# Patient Record
Sex: Female | Born: 1955 | Race: Black or African American | Hispanic: No | Marital: Married | Smoking: Never smoker
Health system: Southern US, Community
[De-identification: ages and names within clinical notes are randomized; demographics above are authoritative.]

## PROBLEM LIST (undated history)

## (undated) DIAGNOSIS — N280 Ischemia and infarction of kidney: Secondary | ICD-10-CM

## (undated) DIAGNOSIS — I513 Intracardiac thrombosis, not elsewhere classified: Secondary | ICD-10-CM

## (undated) DIAGNOSIS — I4891 Unspecified atrial fibrillation: Secondary | ICD-10-CM

## (undated) DIAGNOSIS — I050 Rheumatic mitral stenosis: Secondary | ICD-10-CM

## (undated) DIAGNOSIS — E7800 Pure hypercholesterolemia, unspecified: Secondary | ICD-10-CM

## (undated) DIAGNOSIS — R011 Cardiac murmur, unspecified: Secondary | ICD-10-CM

## (undated) DIAGNOSIS — E669 Obesity, unspecified: Secondary | ICD-10-CM

## (undated) DIAGNOSIS — J45909 Unspecified asthma, uncomplicated: Secondary | ICD-10-CM

## (undated) DIAGNOSIS — I452 Bifascicular block: Secondary | ICD-10-CM

## (undated) DIAGNOSIS — I5032 Chronic diastolic (congestive) heart failure: Secondary | ICD-10-CM

## (undated) DIAGNOSIS — I060 Rheumatic aortic stenosis: Secondary | ICD-10-CM

## (undated) HISTORY — PX: KNEE ARTHROSCOPY: SUR90

## (undated) HISTORY — PX: ABDOMINAL HYSTERECTOMY: SHX81

---

## 1999-01-09 ENCOUNTER — Emergency Department (HOSPITAL_COMMUNITY): Admission: EM | Admit: 1999-01-09 | Discharge: 1999-01-09 | Payer: Self-pay | Admitting: Emergency Medicine

## 1999-01-09 ENCOUNTER — Encounter: Payer: Self-pay | Admitting: Emergency Medicine

## 2002-03-14 ENCOUNTER — Encounter: Payer: Self-pay | Admitting: Family Medicine

## 2002-03-14 ENCOUNTER — Encounter: Admission: RE | Admit: 2002-03-14 | Discharge: 2002-03-14 | Payer: Self-pay | Admitting: Family Medicine

## 2003-01-31 ENCOUNTER — Encounter: Admission: RE | Admit: 2003-01-31 | Discharge: 2003-01-31 | Payer: Self-pay | Admitting: Orthopedic Surgery

## 2003-02-24 ENCOUNTER — Encounter: Admission: RE | Admit: 2003-02-24 | Discharge: 2003-02-24 | Payer: Self-pay | Admitting: Orthopedic Surgery

## 2003-03-15 ENCOUNTER — Ambulatory Visit (HOSPITAL_COMMUNITY): Admission: RE | Admit: 2003-03-15 | Discharge: 2003-03-15 | Payer: Self-pay | Admitting: Family Medicine

## 2003-08-24 ENCOUNTER — Ambulatory Visit (HOSPITAL_COMMUNITY): Admission: RE | Admit: 2003-08-24 | Discharge: 2003-08-24 | Payer: Self-pay | Admitting: Orthopedic Surgery

## 2004-05-12 ENCOUNTER — Encounter: Admission: RE | Admit: 2004-05-12 | Discharge: 2004-05-12 | Payer: Self-pay | Admitting: Family Medicine

## 2004-12-22 ENCOUNTER — Encounter: Admission: RE | Admit: 2004-12-22 | Discharge: 2004-12-22 | Payer: Self-pay | Admitting: Otolaryngology

## 2005-03-09 ENCOUNTER — Encounter (INDEPENDENT_AMBULATORY_CARE_PROVIDER_SITE_OTHER): Payer: Self-pay | Admitting: Specialist

## 2005-03-09 ENCOUNTER — Inpatient Hospital Stay (HOSPITAL_COMMUNITY): Admission: RE | Admit: 2005-03-09 | Discharge: 2005-03-11 | Payer: Self-pay | Admitting: Obstetrics and Gynecology

## 2007-10-17 ENCOUNTER — Emergency Department (HOSPITAL_BASED_OUTPATIENT_CLINIC_OR_DEPARTMENT_OTHER): Admission: EM | Admit: 2007-10-17 | Discharge: 2007-10-17 | Payer: Self-pay | Admitting: Emergency Medicine

## 2008-12-27 ENCOUNTER — Ambulatory Visit (HOSPITAL_COMMUNITY): Admission: RE | Admit: 2008-12-27 | Discharge: 2008-12-27 | Payer: Self-pay | Admitting: Family Medicine

## 2010-02-01 ENCOUNTER — Encounter: Payer: Self-pay | Admitting: Orthopedic Surgery

## 2010-02-02 ENCOUNTER — Encounter: Payer: Self-pay | Admitting: Family Medicine

## 2010-05-30 NOTE — Op Note (Signed)
NAME:  [name] [name] [name], [name] [name]                 ACCOUNT NO.:  [id number]   MEDICAL RECORD NO.:  [id number]          PATIENT TYPE:  INP   LOCATION:  9399                          FACILITY:  [facility]   PHYSICIAN:  Cynthia P. Romine, M.D.DATE OF BIRTH:  [birth year]   DATE OF PROCEDURE:  03/09/2005  DATE OF DISCHARGE:                                 OPERATIVE REPORT   PREOPERATIVE DIAGNOSES:  1.  Menorrhagia.  2.  Uterine fibroids.  3.  Morbid obesity.   POSTOPERATIVE DIAGNOSES:  1.  Menorrhagia.  2.  Uterine fibroids.  3.  Morbid obesity.   OPERATION/PROCEDURE:  Total abdominal hysterectomy.   SURGEON:  Cynthia P. Romine, M.D.   ASSISTANT:  Lum Keas, M.D.   ANESTHESIA:  General endotracheal anesthesia.   ESTIMATED BLOOD LOSS:  400 mL.   COMPLICATIONS:  None.   DESCRIPTION OF PROCEDURE:  The patient was taken to the operating room and  after the induction of adequate general anesthesia, approximately 35 minutes  was spent prepping the patient's abdomen for surgery.  A Montgomery strap  was placed on the upper abdomen as the benzoin was applied.  Kerlix was  laced through the Montgomery strap and then the Montgomery strap was then  tied up to the bar over her face for anesthesia and this was to elevate her  pannus so that an appropriate incision could be made without the pannus  folding.  The pannus was rather large and there was a large fold right over  her symphysis that had to be dealt with and this was done with the  Montgomery strap.  She was then prepped with Betadine abdominally and  vaginally and catheter was placed.  She was draped with towels and then the  vertical incision was draped.   A vertical midline incision was made just from below the umbilicus to the  symphysis.  The subcutaneous tissue was taken down with the Bovie.  The  fascia was identified and opened with the Bovie in the midline and then  incised with scissors vertically.  The underlying rectus muscle was  separated sharply and bluntly in the midline.  The underlying peritoneum was  entered atraumatically and opened vertically with the Metzenbaum scissors.  A Balfour retractor was placed and a regular Balfour retractor was too short  to achieve adequate retraction.  Therefore, that one was removed and a long  Balfour retractor was placed which was accepted.  Very careful attention was  made to make sure that the blade was not pinching the pelvic sidewall.  The  bladder blade was not deep enough on the Balfour retractor so a Surveyor, mining was used and hand held by the scrub nurse during the surgery.  The  upper abdomen was inspected.  There were some adhesions between the liver  and the anterior abdominal wall.  The gallbladder contained no stones.  There was no pelvic or periaortic adenopathy.  In the pelvis the uterus was  enlarged to approximately 14-week size.  There were multiple uterine  fibroids. There were adhesions from the uterus to the bowels that  were  carefully taken down.  The uterus was then elevated out of the pelvis.  Kellys were placed in the cornua.  The round ligaments were stitched and  divided with Bovie.  The anterior leaf of the broad ligament was taken down  sharply on each side.  A window was created in the mesosalpinx and the  pedicle contending the right utero-ovarian and the tube were identified,  clamped and doubly tied on each side.  Uterine arteries were skeletonized.  The posterior peritoneum was taken down.  The bladder was taken down  sharply.  The uterine arteries were clamped on each side, cut and doubly  tied.  Hysterectomy proceeded down to cardinal ligaments, clamping, cutting  and tying in sequence.  After the first pedicle was taken beneath the  uterine arteries, the fundus was removed.  The cervix was grasped with  Kochers and the rest of the hysterectomy continued down the cardinal  ligaments, clamping, cutting and tying in sequence and then  clamping,  cutting and tying the uterosacral ligaments with a separate pedicle.  The  vagina was entered on the right and the specimen was removed with Mayo  scissors.  The vaginal cuff was grasped with Kochers.  Angle sutures were  placed at the right and left angles.  The vaginal cuff was then closed with  interrupted figure-of-eight sutures of 0 Vicryl.  The cuff was inspected and  felt to be hemostatic.  The ovaries were tied up to the round ligament on  each side.  Uterosacral ligaments were then tied together in the midline and  when that was done, there was noted to be a significant bleeder from between  the cuff and the bladder.  The cuff was difficult to assess now that the  uterosacral ligaments had been tied together.  Therefore, it was removed so  that the cuff was more easily spread out and the anatomy could be visualized  well.  A suture was placed at the site of the bleeder and all of the  pedicles were inspected and then felt to be hemostatic.  There was some  oozing from the base of the bladder and a piece of Gelfoam was placed there  and achieved hemostasis.  Ureters were identified bilaterally. The right one  seen to peristalsis.  The left one could not be seen to peristalsis and  could be palpated and is palpably normal.  The packs were removed.  The  bowel was allowed to return to its anatomic position.  The fascia was closed  with interrupted figure-of-eight sutures starting at the bottom and going to  the midline and then going from the top to the midline where the fascia was  closed and ON-Q catheter was placed in the subfascial space.  The  subcutaneous space was irrigated and was felt to be hemostatic.  A Al Pimple drain was placed in the subcutaneous space.  The skin was then closed  with staples. Dressing was applied.  The __________  was attached to the  Jackson-Pratt drain.  The ON-Q catheter was injected with 10 mL of 1% Xylocaine and this was hooked up  to the ON-Q pump to deliver Marcaine.  Dressing was applied and the procedure was terminated.  Sponge and  instrument counts were correct x3.  The patient was taken to the recovery  room in satisfactory condition.  The procedure overall was especially  difficult because of the morbid obesity of the patient.  She is 4 feet 11  inches tall and 252 pounds.           ______________________________  Edwena Felty. Romine, M.D.     CPR/MEDQ  D:  03/09/2005  T:  03/09/2005  Job:  [id number]   cc:   Elizabeth Palau, N.P.  [facility]

## 2010-05-30 NOTE — Discharge Summary (Signed)
NAME:  [name] [name], [name] [name]                 ACCOUNT NO.:  [id number]   MEDICAL RECORD NO.:  [id number]          PATIENT TYPE:  INP   LOCATION:  9310                          FACILITY:  [facility]   PHYSICIAN:  Cynthia P. Romine, M.D.DATE OF BIRTH:  October 07, 1955   DATE OF ADMISSION:  03/09/2005  DATE OF DISCHARGE:  03/11/2005                                 DISCHARGE SUMMARY   DISCHARGE DIAGNOSES:  1.  Leiomyomata.  2.  Menorrhagia.  3.  Morbid obesity.   HISTORY:  This is a 55 year old divorced black female, gravida 3, para 3,  complaining of menorrhagia to the point of anemia.  An ultrasound showed her  uterus to contain multiple fibroids 3-4 cm each.  The adnexa were normal.  The patient declined medical management and wanted definitive therapy with  hysterectomy.   On March 09, 2005, she was admitted to [facility] and underwent a  total abdominal hysterectomy.  Estimated blood loss was 400 cc.  There were  no complications.   Postoperatively, she did very well.  She was in satisfactory condition for  discharge on postoperative day #2.  She did have a postoperative On-Q pump  that was taken out before discharge.  She had staples in her vertical  abdominal incision, and she was instructed to come to the office to have  those removed in approximately five days.  She was given a prescription for  Percocet 5 mg #20 1 p.o. q.4h. p.r.n. pain.  Pathology report did confirm  leiomyomata.  The uterus measured 10 x 11 x 11 cm, and the cervix was 2 cm  in length and weighed 523 gm.   Labs showed admission H&H was 11 and 33.7.  On discharge, 9 and 27.2.           ______________________________  Edwena Felty. Romine, M.D.     CPR/MEDQ  D:  03/30/2005  T:  03/30/2005  Job:  [id number]

## 2010-05-30 NOTE — Op Note (Signed)
NAME:  [name] [name], [name] [name]                           ACCOUNT NO.:  [id number]   MEDICAL RECORD NO.:  [id number]                   PATIENT TYPE:  AMB   LOCATION:  DAY                                  FACILITY:  [facility]   PHYSICIAN:  Madlyn Frankel. Charlann Boxer, M.D.               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   POSTOPERATIVE DIAGNOSES/FINDINGS:  1. Abundant hyertrophic synovitis with synovial banding in the medial     compartment.  2. 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  3. Grade 2-3 chondromalacia in the medial compartment.  4. Degenerative complex tear to the posterior horn mid body of the medial     meniscus.  5. Degenerative tearing and fraying to the central portion of the lateral     meniscus and no discreet radial or cleavage type tear.  6. Grade 1 changes to the lateral compartment only.  7. Anterior cruciate ligament deficiency.   PROCEDURE:  1. Right knee diagnostic and operative arthroscopy with synovectomy,     anterior medial.  A medial and patellofemoral chondroplasty utilizing the     3.5 shaver and ring curettes in the femoral trochlea groove.  2. Subtotal medial meniscectomy.  3. Very minor lateral compartment debridement and notch debridement carried     out, no ACL reconstruction carried out.   SURGEON:  Madlyn Frankel. Charlann Boxer, M.D.   ASSISTANT:  None.   ANESTHESIA:  General.   ESTIMATED BLOOD LOSS:  Minimal.   COMPLICATIONS:  None apparent.   INDICATIONS FOR PROCEDURE:  [name] is a 55 year old female who had injured  her knee at work.  A MRI had been ordered by Dr. Trellis Paganini which revealed  deficient ACL  as well as medial and lateral meniscal tears. She had poor  physical exam findings that matched these findings. Based on her desire to  return to work and review of her exam findings matching the MRI findings, we  discussed surgical options.  At her age and activity level, it is not felt  that an ACL reconstruction would be warranted and for this reason, we would  offer to her medial and lateral meniscectomies and knee examination and  debridement as needed. After reviewing these risks and benefits including  the fact of not doing the ACL and postop rehab required. She consents for  the above procedure.   DESCRIPTION OF PROCEDURE:  The patient was brought to the operative theatre.  Once adequate anesthesia was established, the patient's right lower  extremity was placed in a leg holder, antibiotics  were given, standard  inferolateral, superolateral and inferomedial portals were utilized.  Diagnostic evaluation, the knee revealed the above noted findings. There  were multiple loose bodies that were noted and removed through suction  drainage. It was also evident the significant synovitis present. Examination  of the knee revealed the above noted findings. The inferior medial portal  was utilized as a working portal and utilized to debrided with baskets the  medial meniscus in subtotal form and 3.5 shaver used to debride this area.  A probe evaluation was done periodically to assess unstable flaps and  baskets utilized appropriately.  A notch was then identified and the empty  wall sign noted.  Debridement with 3.5 shaver was carried out to debride the  bulk of this area. The PCL was intact.  Next, the lateral compartment was  entered and noted to be with minor degenerative changes to the central  portion of the meniscus and cartilage.  The 3.5 shaver was used to debride  this. Attention was directed throughout the __________ carrying out a  synovectomy mainly on the medial side with a large  band as well as  anteriorly in order to help with visualization.  The patellofemoral  compartment and with 3.5 shaver was initially used to debride the patellar  surfaces which were minor but the trochlear surface required the use of a  ring curette in order to remove unstable flaps of cartilage to stable  levels. No other abrasion or microfracture technique was carried out. This  was not discussed nor was it planned for based on MRI findings or the  complaints as her primary complaints were mechanical in nature associated  with joint line tenderness. Following this procedure, the knee was copiously  irrigated through suction drainage with a 3.5 shaver, the knee was  reexamined for any loose bodies.  Visualization post debridement was  significantly improved. Instrumentation was removed.  The portals were  reapproximated with 3-0 nylon, the knee was injected with 0.25% Marcaine  with epinephrine. The knee was then dressed in sterile bulky Jones dressing.   The patient will be discharged home with postoperative medication and will  be weightbearing as tolerated and return to see me in 10-14 days.  I expect  her outcome from the knee arthroscopy to do fairy well. We will have her set  up with physical therapy for quad and hamstring strengthening once she has  recovered from __________.                                               Madlyn Frankel Charlann Boxer, M.D.    MDO/MEDQ  D:  08/24/2003  T:  08/25/2003  Job:  [id number]

## 2011-07-07 ENCOUNTER — Other Ambulatory Visit (HOSPITAL_COMMUNITY): Payer: Self-pay | Admitting: Family Medicine

## 2011-07-07 DIAGNOSIS — Z1231 Encounter for screening mammogram for malignant neoplasm of breast: Secondary | ICD-10-CM

## 2011-07-30 ENCOUNTER — Ambulatory Visit (HOSPITAL_COMMUNITY): Payer: Self-pay

## 2011-08-13 ENCOUNTER — Ambulatory Visit (HOSPITAL_COMMUNITY): Payer: 59 | Attending: Family Medicine

## 2011-10-06 ENCOUNTER — Ambulatory Visit (HOSPITAL_COMMUNITY)
Admission: RE | Admit: 2011-10-06 | Discharge: 2011-10-06 | Disposition: A | Discharge status: Home / Self Care | Payer: 59 | Source: Ambulatory Visit | Attending: Family Medicine | Admitting: Family Medicine

## 2011-10-06 DIAGNOSIS — Z1231 Encounter for screening mammogram for malignant neoplasm of breast: Secondary | ICD-10-CM | POA: Insufficient documentation

## 2011-10-14 ENCOUNTER — Emergency Department (HOSPITAL_BASED_OUTPATIENT_CLINIC_OR_DEPARTMENT_OTHER)
Admission: EM | Admit: 2011-10-14 | Discharge: 2011-10-14 | Disposition: A | Discharge status: Home / Self Care | Payer: 59 | Attending: Emergency Medicine | Admitting: Emergency Medicine

## 2011-10-14 ENCOUNTER — Encounter (HOSPITAL_BASED_OUTPATIENT_CLINIC_OR_DEPARTMENT_OTHER): Payer: Self-pay | Admitting: Emergency Medicine

## 2011-10-14 DIAGNOSIS — Z79899 Other long term (current) drug therapy: Secondary | ICD-10-CM | POA: Insufficient documentation

## 2011-10-14 DIAGNOSIS — Z885 Allergy status to narcotic agent status: Secondary | ICD-10-CM | POA: Insufficient documentation

## 2011-10-14 DIAGNOSIS — R040 Epistaxis: Secondary | ICD-10-CM

## 2011-10-14 HISTORY — DX: Unspecified asthma, uncomplicated: J45.909

## 2011-10-14 NOTE — ED Provider Notes (Signed)
History     CSN: [id number]  Arrival date & time 10/14/11  1104   First MD Initiated Contact with Patient 10/14/11 1141      Chief Complaint  Patient presents with  . Epistaxis    (Consider location/radiation/quality/duration/timing/severity/associated sxs/prior treatment) HPI Patient with short episode of bleeding from nose that began last night. She thinks it was from the left side. It resolves spontaneously. It has not returned. She has not any blood thinners and does not take aspirin. She denies any trauma to her nose. She has not weak or lightheaded. She has recently started Topamax and phentermine and voices concerns that these may contribute to any bleeding problems. Past Medical History  Diagnosis Date  . Asthma     Past Surgical History  Procedure Date  . Abdominal hysterectomy   . Knee arthroscopy     No family history on file.  History  Substance Use Topics  . Smoking status: Not on file  . Smokeless tobacco: Not on file  . Alcohol Use: No    OB History    Grav Para Term Preterm Abortions TAB SAB Ect Mult Living                  Review of Systems  Constitutional: Negative for fever, chills, activity change, appetite change and unexpected weight change.  HENT: Negative for sore throat, rhinorrhea, neck pain, neck stiffness and sinus pressure.   Eyes: Negative for visual disturbance.  Respiratory: Negative for cough and shortness of breath.   Cardiovascular: Negative for chest pain and leg swelling.  Gastrointestinal: Negative for vomiting, abdominal pain, diarrhea and blood in stool.  Genitourinary: Negative for dysuria, urgency, frequency, vaginal discharge and difficulty urinating.  Musculoskeletal: Negative for myalgias, arthralgias and gait problem.  Skin: Negative for color change and rash.  Neurological: Negative for weakness, light-headedness and headaches.  Hematological: Does not bruise/bleed easily.  Psychiatric/Behavioral: Negative for  dysphoric mood.    Allergies  Codeine  Home Medications   Current Outpatient Rx  Name Route Sig Dispense Refill  . PHENTERMINE HCL 37.5 MG PO CAPS Oral Take 37.5 mg by mouth every morning.    . TOPIRAMATE 100 MG PO TABS Oral Take 100 mg by mouth daily.      BP 140/70  Pulse 82  Temp 97.2 F (36.2 C) (Oral)  Resp 16  Ht 5' (1.524 m)  Wt 245 lb (111.131 kg)  BMI 47.85 kg/m2  SpO2 99%  Physical Exam  Nursing note and vitals reviewed. Constitutional: She is oriented to person, place, and time. She appears well-developed and well-nourished.       Morbidly obese  HENT:  Head: Normocephalic and atraumatic.  Nose: Mucosal edema present. No rhinorrhea, nose lacerations, nasal deformity, septal deviation or nasal septal hematoma. No epistaxis. Right sinus exhibits no frontal sinus tenderness. Left sinus exhibits no frontal sinus tenderness.  Eyes: Conjunctivae normal are normal. Pupils are equal, round, and reactive to light.  Neck: Normal range of motion. Neck supple.  Cardiovascular: Normal rate and regular rhythm.   Pulmonary/Chest: Effort normal.  Abdominal: Soft. Bowel sounds are normal.  Musculoskeletal: Normal range of motion.  Neurological: She is alert and oriented to person, place, and time.  Skin: Skin is warm and dry.  Psychiatric: She has a normal mood and affect.    ED Course  Procedures (including critical care time)  Labs Reviewed - No data to display No results found.   No diagnosis found.    MDM  Patient with resolution of bleeding.  Patient counseled regarding treatment and prevention.        Hilario Quarry, MD 10/14/11 1153

## 2011-10-14 NOTE — ED Notes (Signed)
States last night started having a nosebleed and has had it intermittently throughout the night and this am.  Coughed up a "scab."  Was seen at [facility] Friday and put on diet pills.  Only change.  Has added fluids to her diet.

## 2012-03-20 ENCOUNTER — Emergency Department (HOSPITAL_BASED_OUTPATIENT_CLINIC_OR_DEPARTMENT_OTHER)
Admission: EM | Admit: 2012-03-20 | Discharge: 2012-03-20 | Disposition: A | Discharge status: Home / Self Care | Payer: 59 | Attending: Emergency Medicine | Admitting: Emergency Medicine

## 2012-03-20 ENCOUNTER — Encounter (HOSPITAL_BASED_OUTPATIENT_CLINIC_OR_DEPARTMENT_OTHER): Payer: Self-pay

## 2012-03-20 DIAGNOSIS — J45909 Unspecified asthma, uncomplicated: Secondary | ICD-10-CM | POA: Insufficient documentation

## 2012-03-20 DIAGNOSIS — Y9289 Other specified places as the place of occurrence of the external cause: Secondary | ICD-10-CM | POA: Insufficient documentation

## 2012-03-20 DIAGNOSIS — S335XXA Sprain of ligaments of lumbar spine, initial encounter: Secondary | ICD-10-CM | POA: Insufficient documentation

## 2012-03-20 DIAGNOSIS — E669 Obesity, unspecified: Secondary | ICD-10-CM | POA: Insufficient documentation

## 2012-03-20 DIAGNOSIS — Z79899 Other long term (current) drug therapy: Secondary | ICD-10-CM | POA: Insufficient documentation

## 2012-03-20 DIAGNOSIS — Y93G9 Activity, other involving cooking and grilling: Secondary | ICD-10-CM | POA: Insufficient documentation

## 2012-03-20 DIAGNOSIS — N390 Urinary tract infection, site not specified: Secondary | ICD-10-CM | POA: Insufficient documentation

## 2012-03-20 DIAGNOSIS — X500XXA Overexertion from strenuous movement or load, initial encounter: Secondary | ICD-10-CM | POA: Insufficient documentation

## 2012-03-20 DIAGNOSIS — S39012A Strain of muscle, fascia and tendon of lower back, initial encounter: Secondary | ICD-10-CM

## 2012-03-20 HISTORY — DX: Obesity, unspecified: E66.9

## 2012-03-20 LAB — CBC WITH DIFFERENTIAL/PLATELET
Basophils Absolute: 0 10*3/uL (ref 0.0–0.1)
Basophils Relative: 0 % (ref 0–1)
Eosinophils Absolute: 0.1 10*3/uL (ref 0.0–0.7)
Eosinophils Relative: 2 % (ref 0–5)
HCT: 39.1 % (ref 36.0–46.0)
Hemoglobin: 12.7 g/dL (ref 12.0–15.0)
Lymphocytes Relative: 23 % (ref 12–46)
Lymphs Abs: 1.2 10*3/uL (ref 0.7–4.0)
MCH: 29.4 pg (ref 26.0–34.0)
MCHC: 32.5 g/dL (ref 30.0–36.0)
MCV: 90.5 fL (ref 78.0–100.0)
Monocytes Absolute: 0.5 10*3/uL (ref 0.1–1.0)
Monocytes Relative: 9 % (ref 3–12)
Neutro Abs: 3.3 10*3/uL (ref 1.7–7.7)
Neutrophils Relative %: 65 % (ref 43–77)
Platelets: 205 10*3/uL (ref 150–400)
RBC: 4.32 MIL/uL (ref 3.87–5.11)
RDW: 15.3 % (ref 11.5–15.5)
WBC: 5 10*3/uL (ref 4.0–10.5)

## 2012-03-20 LAB — BASIC METABOLIC PANEL
BUN: 16 mg/dL (ref 6–23)
CO2: 29 mEq/L (ref 19–32)
Calcium: 9.4 mg/dL (ref 8.4–10.5)
Chloride: 103 mEq/L (ref 96–112)
Creatinine, Ser: 0.7 mg/dL (ref 0.50–1.10)
GFR calc Af Amer: >90 mL/min (ref >90)
GFR calc non Af Amer: >90 mL/min (ref >90)
Glucose, Bld: 100 mg/dL — ABNORMAL HIGH (ref 70–99)
Potassium: 4.3 mEq/L (ref 3.5–5.1)
Sodium: 141 mEq/L (ref 135–145)

## 2012-03-20 LAB — URINALYSIS, ROUTINE W REFLEX MICROSCOPIC
Bilirubin Urine: NEGATIVE
Glucose, UA: NEGATIVE mg/dL
Hgb urine dipstick: NEGATIVE
Ketones, ur: NEGATIVE mg/dL
Nitrite: NEGATIVE
Protein, ur: NEGATIVE mg/dL
Specific Gravity, Urine: 1.021 (ref 1.005–1.030)
Urobilinogen, UA: 1 mg/dL (ref 0.0–1.0)
pH: 7 (ref 5.0–8.0)

## 2012-03-20 LAB — URINE MICROSCOPIC-ADD ON

## 2012-03-20 MED ORDER — IBUPROFEN 800 MG PO TABS: 800.0000 mg | ORAL_TABLET | Freq: Three times a day (TID) | ORAL | Status: DC | PRN

## 2012-03-20 MED ORDER — HYDROCODONE-ACETAMINOPHEN 5-325 MG PO TABS: 1.0000 | ORAL_TABLET | Freq: Four times a day (QID) | ORAL | Status: DC | PRN

## 2012-03-20 MED ORDER — CEPHALEXIN 500 MG PO CAPS: 500.0000 mg | ORAL_CAPSULE | Freq: Four times a day (QID) | ORAL | Status: DC

## 2012-03-20 MED ORDER — OXYCODONE-ACETAMINOPHEN 5-325 MG PO TABS
1.0000 | ORAL_TABLET | Freq: Once | ORAL | Status: AC
  Administered 2012-03-20: 1 via ORAL
  Filled 2012-03-20 (×2): qty 1

## 2012-03-20 MED ORDER — IBUPROFEN 800 MG PO TABS
800.0000 mg | ORAL_TABLET | Freq: Once | ORAL | Status: AC
  Administered 2012-03-20: 800 mg via ORAL
  Filled 2012-03-20: qty 1

## 2012-03-20 NOTE — ED Provider Notes (Signed)
Medical screening examination/treatment/procedure(s) were performed by non-physician practitioner and as supervising physician I was immediately available for consultation/collaboration.  Imani Fiebelkorn, MD 03/20/12 1539 

## 2012-03-20 NOTE — ED Provider Notes (Signed)
History     CSN: [id number]  Arrival date & time 03/20/12  1043   First MD Initiated Contact with Patient 03/20/12 1159      Chief Complaint  Patient presents with  . Back Pain    (Consider location/radiation/quality/duration/timing/severity/associated sxs/prior treatment) HPI Patient presents emergency department with left back pain.  Patient, states she was cooking when she turned and started feeling sudden onset of left mid back pain.  Patient, states today's issues, moving mattresses, and some bedroom furniture.  Patient denies any other injuries.  He should states she did not fall.  Patient denies numbness, weakness, incontinence, fever, dysuria, or weakness.  Patient, states, that she's quite a few times over the last few days and noticed blood after urinating.  Patient denies she took anything prior to route for her symptoms. Past Medical History  Diagnosis Date  . Asthma   . Obesity     Past Surgical History  Procedure Laterality Date  . Abdominal hysterectomy    . Knee arthroscopy      No family history on file.  History  Substance Use Topics  . Smoking status: Not on file  . Smokeless tobacco: Never Used  . Alcohol Use: No    OB History   Grav Para Term Preterm Abortions TAB SAB Ect Mult Living                  Review of Systems All other systems negative except as documented in the HPI. All pertinent positives and negatives as reviewed in the HPI. Allergies  Codeine  Home Medications   Current Outpatient Rx  Name  Route  Sig  Dispense  Refill  . albuterol (PROVENTIL HFA;VENTOLIN HFA) 108 (90 BASE) MCG/ACT inhaler   Inhalation   Inhale 2 puffs into the lungs every 6 (six) hours as needed for wheezing.         . budesonide (PULMICORT) 180 MCG/ACT inhaler   Inhalation   Inhale 1-2 puffs into the lungs 2 (two) times daily.         Marland Kitchen SIMVASTATIN PO   Oral   Take by mouth.         . phentermine 37.5 MG capsule   Oral   Take 37.5 mg by mouth  every morning.         . topiramate (TOPAMAX) 100 MG tablet   Oral   Take 100 mg by mouth daily.           BP 138/75  Pulse 81  Temp(Src) 98.1 F (36.7 C) (Oral)  Resp 20  Ht 5' (1.524 m)  Wt 245 lb (111.131 kg)  BMI 47.85 kg/m2  SpO2 100%  Physical Exam  Constitutional: She is oriented to person, place, and time. She appears well-developed and well-nourished.  HENT:  Head: Normocephalic and atraumatic.  Mouth/Throat: Oropharynx is clear and moist.  Eyes: Pupils are equal, round, and reactive to light.  Neck: Normal range of motion. Neck supple.  Cardiovascular: Normal rate, regular rhythm and normal heart sounds.  Exam reveals no gallop and no friction rub.   No murmur heard. Pulmonary/Chest: Effort normal and breath sounds normal. No respiratory distress. She has no wheezes. She has no rales. She exhibits no tenderness.  Abdominal: Soft. Bowel sounds are normal. She exhibits no distension. There is no tenderness. There is no guarding.  Musculoskeletal:       Lumbar back: She exhibits tenderness and pain. She exhibits normal range of motion, no bony tenderness, no deformity  and no spasm.       Back:  Neurological: She is alert and oriented to person, place, and time.  Skin: Skin is warm and dry. No rash noted.    ED Course  Procedures (including critical care time)  Labs Reviewed  BASIC METABOLIC PANEL - Abnormal; Notable for the following:    Glucose, Bld 100 (*)    All other components within normal limits  URINALYSIS, ROUTINE W REFLEX MICROSCOPIC - Abnormal; Notable for the following:    Leukocytes, UA SMALL (*)    All other components within normal limits  URINE MICROSCOPIC-ADD ON - Abnormal; Notable for the following:    Squamous Epithelial / LPF FEW (*)    Bacteria, UA MANY (*)    All other components within normal limits  URINE CULTURE  CBC WITH DIFFERENTIAL   The patient be treated for lumbar muscular strain.  She is also advised to use ice and heat  on her back.  She is also advised to follow up with her primary care Dr. for recheck.  Patient may have a slight urinary tract infection and will give treatment for that as well.   MDM         Carlyle Dolly, PA-C 03/20/12 1427

## 2012-03-20 NOTE — ED Notes (Signed)
Pt states that she onset of severe L sided back pain this morning, pt has not taken anything for her pain.  Pt denies hematuria, dysuria, n/v.

## 2012-03-22 LAB — URINE CULTURE: Colony Count: 80000

## 2012-07-04 ENCOUNTER — Encounter: Payer: 59 | Attending: Family Medicine | Admitting: Dietician

## 2012-07-04 ENCOUNTER — Encounter: Payer: Self-pay | Admitting: Dietician

## 2012-07-04 DIAGNOSIS — E669 Obesity, unspecified: Secondary | ICD-10-CM | POA: Insufficient documentation

## 2012-07-04 DIAGNOSIS — Z713 Dietary counseling and surveillance: Secondary | ICD-10-CM | POA: Insufficient documentation

## 2012-07-04 NOTE — Progress Notes (Signed)
Medical Nutrition Therapy:  Appt start time: 0900 end time:  1000.  Assessment:  Primary concerns today:  Obesity.   MEDICATIONS: see list.   DIETARY INTAKE:  Pt states she sometimes is forced to work a doubleshift due to her employment set up. In this case, she may not eat at normal breakfast time (about 8 am), and needs to work 12 pm. Her standard shift is 12am to 8 am. She states some difficulties with keeping a consistent schedule. Her sleep schedule is often interrupted.  Usual eating pattern includes 2-3 meals and 1-2 snacks per day.  Everyday foods include sweet drinks.  Avoided foods include pork, shrimp ([place]).    24-hr recall:  B ( AM): often gets fast food, particularly [employer]- chicken biscuit, and drink like sweet tea, OJ. She often eats only half of breading. Sometimes coffee with cream (1-2 TBSP). Sometimes needs to wait until 12 noon. Usually at 10-1028. If interrupted, she eats just a pack of nabs and/or fruit, popcorn. Snk ( AM): none L ( PM): might have PB sandwich, or leftovers, or eating out Snk ( PM): sometimes ice cream D ( PM): usually packs something for work- fish or [place] with greens, rice or sweet potato. Sometimes just a protein and starch. She skips dinner when she feels she ate lunch late or had a heavy lunch. Snk ( PM): popcorn, nabs, fruit, granola bars, sometimes chips Beverages: coffee, sweet tea, OJ or cranberry juice, few sodas (no diet), water, milk (2%) before bed  Pt claims to enjoy sweets, but limits intake.  Usual physical activity: pool, water aerobics after her work shift (about 9 am) tries for 3 days per week for about an hour each. On feet for work. She works in her garden and does some yard work.   Progress Towards Goal(s):  In progress.   Nutritional Diagnosis:  [place]-3.3 Overweight/obesity As related to intake of high kcal foods, particularly sweet drinks, meal skipping, low to moderate physical activity.  As evidenced by pt reports of  same, diet recall, BMI>40.    Intervention:  Nutrition counseling provided regarding reduction of total kcal intake by limiting high fat and high sugar foods, focusing on increasing high protein and high fiber food intake. RD also stressed the importance of gaining consistency in her meal pattern, and improving sleep habits to obtain 7 uninterrupted hours per day. RD discussed with patient an appropriate meal, physical activity and sleep schedule, and pt expressed good understanding. RD also answered questions about possible surgical weight loss options.  RD prescribed a meal pattern to patient as follows: B: 2 CHO, 3 Pro, 2 fat -- before work L: 3 CHO, 3 Pro, 2 fat, 1+ Veg -- at work D: 2 CHO, 3 Pro, 2 fat, 2+ Veg -- before bed S: 2 CHO, 2 Pro, 2 fat -- before workout or during extended shift  Handouts given during visit include:  High Protein, Fat, Fiber, Sugar Foods  CHO controlled diet plan  Monitoring/Evaluation:  Dietary intake, exercise, portion control, meal pattern, and body weight in 6 week(s).

## 2012-08-16 ENCOUNTER — Ambulatory Visit: Payer: 59 | Admitting: Dietician

## 2012-11-03 ENCOUNTER — Institutional Professional Consult (permissible substitution): Payer: 59 | Admitting: Internal Medicine

## 2012-11-03 ENCOUNTER — Encounter: Payer: Self-pay | Admitting: Internal Medicine

## 2012-11-03 ENCOUNTER — Ambulatory Visit (INDEPENDENT_AMBULATORY_CARE_PROVIDER_SITE_OTHER): Payer: 59 | Admitting: Internal Medicine

## 2012-11-03 VITALS — BP 130/86 | HR 77 | Temp 97.5°F | Ht 60.0 in | Wt 267.4 lb

## 2012-11-03 DIAGNOSIS — J45909 Unspecified asthma, uncomplicated: Secondary | ICD-10-CM

## 2012-11-03 DIAGNOSIS — R0609 Other forms of dyspnea: Secondary | ICD-10-CM

## 2012-11-03 DIAGNOSIS — R0600 Dyspnea, unspecified: Secondary | ICD-10-CM

## 2012-11-03 MED ORDER — FAMOTIDINE 20 MG PO TABS: ORAL_TABLET | ORAL | Status: DC

## 2012-11-03 MED ORDER — PANTOPRAZOLE SODIUM 40 MG PO TBEC: 40.0000 mg | DELAYED_RELEASE_TABLET | Freq: Every day | ORAL | Status: DC

## 2012-11-03 NOTE — Progress Notes (Signed)
  Subjective:    Patient ID: [name] [name], female    DOB: [birth year]   MRN: [id number]  HPI  27 yobf no problem as child with resp problems never smoker then onset of asthma at 71 came and went until 2013 but persistent symptoms of doe down the hall way since then gradually worse esp when hot referred by [name] [name] 11/03/2012 to pulmonary clinic  11/03/2012 1st [facility] office visit/ [name] [name] cc indolent onset pregressively worse doe  x hallway at work and while swimming at wt 240 prior to onset of these symptoms.  No real change activity tol  p albuterol or after starting pulmocort.   No obvious day to day or daytime variabilty or assoc chronic cough or cp or chest tightness, subjective wheeze overt sinus or hb symptoms. No unusual exp hx or h/o childhood pna/ asthma or knowledge of premature birth.  Sleeping ok without nocturnal  or early am exacerbation  of respiratory  c/o's or need for noct saba. Also denies any obvious fluctuation of symptoms with weather or environmental changes or other aggravating or alleviating factors except as outlined above   Current Medications, Allergies, Complete Past Medical History, Past Surgical History, Family History, and Social History were reviewed in [employer] record.           Review of Systems  Constitutional: Positive for unexpected weight change. Negative for fever.  HENT: Negative for congestion, dental problem, ear pain, nosebleeds, postnasal drip, rhinorrhea, sinus pressure, sneezing, sore throat and trouble swallowing.   Eyes: Negative for redness and itching.  Respiratory: Positive for chest tightness and shortness of breath. Negative for cough and wheezing.   Cardiovascular: Positive for palpitations. Negative for leg swelling.  Gastrointestinal: Negative for nausea and vomiting.  Genitourinary: Negative for dysuria.  Musculoskeletal: Positive for joint swelling.  Skin: Negative for rash.  Neurological:  Positive for headaches.  Hematological: Does not bruise/bleed easily.  Psychiatric/Behavioral: Negative for dysphoric mood. The patient is not nervous/anxious.        Objective:   Physical Exam   Wt Readings from Last 3 Encounters:  11/03/12 267 lb 6.4 oz (121.292 kg)  07/04/12 261 lb (118.389 kg)  03/20/12 245 lb (111.131 kg)      Obese bf nad with prominent pseudowheeze  HEENT: nl dentition, turbinates, and orophanx. Nl external ear canals without cough reflex   NECK :  without JVD/Nodes/TM/ nl carotid upstrokes bilaterally   LUNGS: no acc muscle use, clear to A and P bilaterally without cough on insp or exp maneuvers   CV:  RRR  no s3 or murmur or increase in P2, no edema   ABD:  soft and nontender with nl excursion in the supine position. No bruits or organomegaly, bowel sounds nl  MS:  warm without deformities, calf tenderness, cyanosis or clubbing  SKIN: warm and dry without lesions    NEURO:  alert, approp, no deficits     Spirometry 11/03/2012 wnl including fef 25-75      Assessment & Plan:

## 2012-11-03 NOTE — Assessment & Plan Note (Addendum)
-   spirometry 11/03/2012 > wnl including fef 25 75  So this is not likely asthma and she doesn't have the typical restrictive changes related to obesity, though she is at risk, and certainly this does affect her activity tolerance  However, Symptoms are markedly disproportionate to objective findings and not clear this is a lung problem but pt does appear to have difficult airway management issues. DDX of  difficult airways managment all start with A and  include Adherence, Ace Inhibitors, Acid Reflux, Active Sinus Disease, Alpha 1 Antitripsin deficiency, Anxiety masquerading as Airways dz,  ABPA,  allergy(esp in young), Aspiration (esp in elderly), Adverse effects of DPI,  Active smokers, plus two Bs  = Bronchiectasis and Beta blocker use..and one C= CHF  Adherence is always the initial "prime suspect" and is a multilayered concern that requires a "trust but verify" approach in every patient - starting with knowing how to use medications, especially inhalers, correctly, keeping up with refills and understanding the fundamental difference between maintenance and prns vs those medications only taken for a very short course and then stopped and not refilled.   The proper method of use, as well as anticipated side effects, of a metered-dose inhaler are discussed and demonstrated to the patient. Improved effectiveness after extensive coaching during this visit to a level of approximately  75% p coaching so ok to use saba prn  ? Acid (or non-acid) GERD > always difficult to exclude as up to 75% of pts in some series report no assoc GI/ Heartburn symptoms> rec max (24h)  acid suppression and diet restrictions/ reviewed and instructions given in writting   Will regroup in 4 weeks

## 2012-11-03 NOTE — Patient Instructions (Signed)
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   GERD (REFLUX)  is an extremely common cause of respiratory symptoms, many times with no significant heartburn at all.    It can be treated with medication, but also with lifestyle changes including avoidance of late meals, excessive alcohol, smoking cessation, and avoid fatty foods, chocolate, peppermint, colas, red wine, and acidic juices such as orange juice.  NO MINT OR MENTHOL PRODUCTS SO NO COUGH DROPS  USE SUGARLESS CANDY INSTEAD (jolley ranchers or Stover's)  NO OIL BASED VITAMINS - use powdered substitutes.   Please schedule a follow up office visit in 4 weeks, sooner if needed

## 2012-12-01 ENCOUNTER — Encounter: Payer: Self-pay | Admitting: Internal Medicine

## 2012-12-01 ENCOUNTER — Ambulatory Visit (INDEPENDENT_AMBULATORY_CARE_PROVIDER_SITE_OTHER): Payer: 59 | Admitting: Internal Medicine

## 2012-12-01 VITALS — BP 116/74 | HR 65 | Temp 97.4°F | Ht 59.0 in | Wt 264.0 lb

## 2012-12-01 DIAGNOSIS — R0609 Other forms of dyspnea: Secondary | ICD-10-CM

## 2012-12-01 DIAGNOSIS — R0600 Dyspnea, unspecified: Secondary | ICD-10-CM

## 2012-12-01 NOTE — Patient Instructions (Addendum)
Pantoprazole (protonix) 40 mg   Take 30-60 min before first meal of the day and Pepcid 20 mg one bedtime until return    GERD (REFLUX)  is an extremely common cause of respiratory symptoms, many times with no significant heartburn at all.    It can be treated with medication, but also with lifestyle changes including avoidance of late meals, excessive alcohol, smoking cessation, and avoid fatty foods, chocolate, peppermint, colas, red wine, and acidic juices such as orange juice.  NO MINT OR MENTHOL PRODUCTS SO NO COUGH DROPS  USE SUGARLESS CANDY INSTEAD (jolley ranchers or Stover's)  NO OIL BASED VITAMINS - use powdered substitutes.   Please schedule a follow up visit in 3 months but call sooner if needed

## 2012-12-01 NOTE — Progress Notes (Signed)
Subjective:    Patient ID: [name] [name], female    DOB: 1955-10-29   MRN: [id number]    Brief patient profile:  82 yobf no problem as child with resp problems never smoker then onset of asthma at 70 came and went until 2013 but persistent symptoms of doe down the hallway since then gradually worse esp when hot referred by Dr Doristine Counter 11/03/2012 to pulmonary clinic.   History of Present Illness   11/03/2012 1st [facility] office visit/ [name] [name] cc indolent onset progressively worse doe  x hallway at work and while swimming at wt 240 prior to onset of these symptoms.  No real change activity tol  p albuterol or after starting pulmocort.  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  GERD diet   12/01/2012 f/u ov/[name] [name] re: pseudoasthma Chief Complaint  Patient presents with  . Follow-up    Pt reports her breathing is much improved. Using rescue inhaler on average about 5 times per wk.   typically only uses saba p exertion and does not rechallenge   No obvious day to day or daytime variabilty or assoc chronic cough or cp or chest tightness, subjective wheeze overt sinus or hb symptoms. No unusual exp hx or h/o childhood pna/ asthma or knowledge of premature birth.  Sleeping ok without nocturnal  or early am exacerbation  of respiratory  c/o's or need for noct saba. Also denies any obvious fluctuation of symptoms with weather or environmental changes or other aggravating or alleviating factors except as outlined above   Current Medications, Allergies, Complete Past Medical History, Past Surgical History, Family History, and Social History were reviewed in [employer] record.  ROS  The following are not active complaints unless bolded sore throat, dysphagia, dental problems, itching, sneezing,  nasal congestion or excess/ purulent secretions, ear ache,   fever, chills, sweats, unintended wt loss, pleuritic or exertional cp, hemoptysis,  orthopnea pnd or leg swelling, presyncope, palpitations, heartburn, abdominal pain, anorexia, nausea, vomiting, diarrhea  or change in bowel or urinary habits, change in stools or urine, dysuria,hematuria,  rash, arthralgias, visual complaints, headache, numbness weakness or ataxia or problems with walking or coordination,  change in mood/affect or memory.     d.                  Objective:   Physical Exam  12/01/2012      264  Wt Readings from Last 3 Encounters:  11/03/12 267 lb 6.4 oz (121.292 kg)  07/04/12 261 lb (118.389 kg)  03/20/12 245 lb (111.131 kg)      Obese bf nad with no sign pseudowheeze  HEENT: nl dentition, turbinates, and orophanx. Nl external ear canals without cough reflex   NECK :  without JVD/Nodes/TM/ nl carotid upstrokes bilaterally   LUNGS: no acc muscle use, clear to A and P bilaterally without cough on insp or exp maneuvers   CV:  RRR  no s3 or murmur or increase in P2, no edema   ABD:  soft and nontender with nl excursion in the supine position. No bruits or organomegaly, bowel sounds nl  MS:  warm without deformities, calf tenderness, cyanosis or clubbing  SKIN: warm and dry without lesions    NEURO:  alert, approp, no deficits     Spirometry 11/03/2012 wnl including fef 25-75      Assessment & Plan:

## 2012-12-04 NOTE — Assessment & Plan Note (Addendum)
-   spirometry 11/03/2012 > wnl including fef 25 75   The proper method of use, as well as anticipated side effects, of a metered-dose inhaler are discussed and demonstrated to the patient. Improved effectiveness after extensive coaching during this visit to a level of approximately  25%   It's not really clear how much asthma she actually has because she only gets sob with exertion and is not sure really whether saba helps or rest helps because she never rechallenges and does the same activity twice, once before and once after saba, but her saba use is so poor she's probably mostly swallowing it  In any case, she's much better with rx for GERD so she should continue x 3 months min and see how she does over time.  See instructions for specific recommendations which were reviewed directly with the patient who was given a copy with highlighter outlining the key components.

## 2013-06-02 ENCOUNTER — Ambulatory Visit (INDEPENDENT_AMBULATORY_CARE_PROVIDER_SITE_OTHER): Payer: 59

## 2013-06-02 VITALS — BP 137/89 | HR 83 | Resp 16

## 2013-06-02 DIAGNOSIS — M7750 Other enthesopathy of unspecified foot: Secondary | ICD-10-CM

## 2013-06-02 DIAGNOSIS — M722 Plantar fascial fibromatosis: Secondary | ICD-10-CM

## 2013-06-02 DIAGNOSIS — M778 Other enthesopathies, not elsewhere classified: Secondary | ICD-10-CM

## 2013-06-02 DIAGNOSIS — M779 Enthesopathy, unspecified: Secondary | ICD-10-CM

## 2013-06-02 NOTE — Progress Notes (Signed)
   Subjective:    Patient ID: [name] [name], female    DOB: 1955-04-08, 58 y.o.   MRN: [id number]  HPI Comments: "I just want new orthotics. I would like some that I could get in some dress shoes too"     Review of Systems  All other systems reviewed and are negative.      Objective:   Physical Exam Lower extremity objective findings as follows vascular status is intact pedal pulses palpable epicritic and proprioceptive sensations intact and symmetric bilateral normal plantar response DTRs are listed patient presents this time her orthotics are worn need replacing patient does have gait abnormality should the patient is certainly overweight has a wide hip stem and some gait abnormality patient walks very heavy foot and in promontory changes are noted has a high a tibial varum angle on weightbearing with again slight abnormal gait in promontory changes as such the orthotics we beneficial orthotics skin carried out at this time patient be contacted difficulties with orthotics ready for fitting and dispensing       Assessment & Plan:  Assessment capsulitis and plantar fascial symptomology secondary to abnormalities in promontory for changes orthotics have been beneficial however with air worn need replacing the orthotics skin carried at this time orthotics be fabricated inferolateral patient with the next 2-33  Alvan Dame DPM

## 2013-06-02 NOTE — Patient Instructions (Signed)
WEARING INSTRUCTIONS FOR ORTHOTICS  Don't expect to be comfortable wearing your orthotic devices for the first time.  Like eyeglasses, you may be aware of them as time passes, they will not be uncomfortable and you will enjoy wearing them.  FOLLOW THESE INSTRUCTIONS EXACTLY!  1. Wear your orthotic devices for:       Not more than 1 hour the first day.       Not more than 2 hours the second day.       Not more than 3 hours the third day and so on.        Or wear them for as long as they feel comfortable.       If you experience discomfort in your feet or legs take them out.  When feet & legs feel       better, put them back in.  You do need to be consistent and wear them a little        everyday. 2.   If at any time the orthotic devices become acutely uncomfortable before the       time for that particular day, STOP WEARING THEM. 3.   On the next day, do not increase the wearing time. 4.   Subsequently, increase the wearing time by 15-30 minutes only if comfortable to do       so. 5.   You will be seen by your doctor about 2-4 weeks after you receive your orthotic       devices, at which time you will probably be wearing your devices comfortably        for about 8 hours or more a day. 6.   Some patients occasionally report mild aches or discomfort in other parts of the of       body such as the knees, hips or back after 3 or 4 consecutive hours of wear.  If this       is the case with you, do not extend your wearing time.  Instead, cut it back an hour or       two.  In all likelihood, these symptoms will disappear in a short period of time as your       body posture realigns itself and functions more efficiently. 7.   It is possible that your orthotic device may require some small changes or adjustment       to improve their function or make them more comfortable.   This is usually not done       before one to three months have elapsed.  These adjustments are made in        accordance  with the changed position your feet are assuming as a result of       improved biomechanical function. 8.   In women's shoes, it's not unusual for your heel to slip out of the shoe, particularly if       they are step-in-shoes.  If this is the case, try other shoes or other styles.  Try to       purchase shoes which have deeper heal seats or higher heel counters. 9.   Squeaking of orthotics devices in the shoes is due to the movement of the devices       when they are functioning normally.  To eliminate squeaking, simply dust some       baby powder into your shoes before inserting the devices.  If this does not work,          apply soap or wax to the edges of the orthotic devices or put a tissue into the shoes. 10. It is important that you follow these directions explicitly.  Failure to do so will simply       prolong the adjustment period or create problems which are easily avoided.  It makes       no difference if you are wearing your orthotic devices for only a few hours after        several months, so long as you are wearing them comfortably for those hours. 11. If you have any questions or complaints, contact our office.  We have no way of       knowing about your problems unless you tell us.  If we do not hear from you, we will       assume that you are proceeding well.  Recommendations for placement purchase shoes: Consider the [employer] store on [address] Across from [employer]. Consider a new balance or Geophysicist/field seismologist or running shoe

## 2013-06-26 ENCOUNTER — Other Ambulatory Visit: Payer: 59

## 2013-07-19 ENCOUNTER — Other Ambulatory Visit: Payer: Self-pay

## 2013-07-19 DIAGNOSIS — Z1231 Encounter for screening mammogram for malignant neoplasm of breast: Secondary | ICD-10-CM

## 2013-08-04 ENCOUNTER — Ambulatory Visit: Payer: 59

## 2013-09-05 ENCOUNTER — Other Ambulatory Visit: Payer: 59

## 2013-09-05 ENCOUNTER — Ambulatory Visit: Payer: 59

## 2013-09-05 DIAGNOSIS — M722 Plantar fascial fibromatosis: Secondary | ICD-10-CM

## 2013-09-05 NOTE — Progress Notes (Signed)
Pt is here to PUO 

## 2013-09-05 NOTE — Patient Instructions (Signed)

## 2013-09-12 ENCOUNTER — Ambulatory Visit (INDEPENDENT_AMBULATORY_CARE_PROVIDER_SITE_OTHER): Payer: 59 | Admitting: Cardiology

## 2013-09-12 ENCOUNTER — Ambulatory Visit
Admission: RE | Admit: 2013-09-12 | Discharge: 2013-09-12 | Disposition: A | Discharge status: Home / Self Care | Payer: 59 | Source: Ambulatory Visit | Attending: Cardiology | Admitting: Cardiology

## 2013-09-12 ENCOUNTER — Encounter: Payer: Self-pay | Admitting: Cardiology

## 2013-09-12 VITALS — BP 128/90 | HR 75 | Ht 60.0 in | Wt 249.4 lb

## 2013-09-12 DIAGNOSIS — E7800 Pure hypercholesterolemia, unspecified: Secondary | ICD-10-CM

## 2013-09-12 DIAGNOSIS — R0609 Other forms of dyspnea: Secondary | ICD-10-CM

## 2013-09-12 DIAGNOSIS — R011 Cardiac murmur, unspecified: Secondary | ICD-10-CM

## 2013-09-12 DIAGNOSIS — R0989 Other specified symptoms and signs involving the circulatory and respiratory systems: Secondary | ICD-10-CM

## 2013-09-12 DIAGNOSIS — I452 Bifascicular block: Secondary | ICD-10-CM

## 2013-09-12 HISTORY — DX: Bifascicular block: I45.2

## 2013-09-12 HISTORY — DX: Morbid (severe) obesity due to excess calories: E66.01

## 2013-09-12 HISTORY — DX: Pure hypercholesterolemia, unspecified: E78.00

## 2013-09-12 NOTE — Progress Notes (Signed)
De Nurse Date of Birth:  May 08, 1955 [name] [address] [place], [place]  [address] [phone]        Fax   [phone]   History of Present Illness: This pleasant 58 year old African American woman is seen at the request of Dr. Marjory Lies for evaluation of dyspnea.  She has a history of exogenous obesity and a history of chronic exertional dyspnea.  Review of her outside records reveals that she had had echocardiograms on 04/03/02 and again on 07/11/03.  The patient had forgotten that she had had previous echocardiograms.  2004 she was being evaluated for shortness of breath and a heart murmur and was found to have concentric LVH with normal systolic function and an estimated ejection fraction of 60%.  There was mildly thickened aortic valve with no evidence at that time of significant aortic stenosis and there was trivial aortic regurgitation.  She was felt to have mild to moderate mitral stenosis and trivial to mild mitral regurgitation and moderate tricuspid regurgitation. 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  She went on to have successful knee surgery Recently she has had more problems with her breathing.  She saw Dr. Casimiro Needle worked for pulmonary evaluation on 12/01/12.  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  At followup she was noted to have improved breathing. Recently she has had more problems with her shortness of breath.  She has also had a lot of hot flashes which she attributes to menopause.  She has had problems with morbid obesity and has been taking phentermine.  She has not been experiencing any chest pain or symptoms  to suggest angina pectoris. Her family history reveals her mother died of a stroke.  Her father died of prostate cancer.  There is no family history of heart murmurs. The patient herself denies any history of rheumatic fever as a child. The patient is a nonsmoker.  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  Current Outpatient Prescriptions  Medication Sig Dispense Refill  . cholecalciferol (VITAMIN D) 1000 UNITS tablet Take 1,000 Units by mouth daily. 2 tabs daily = 2,000      . ibuprofen (ADVIL,MOTRIN) 800 MG tablet Take 1 tablet (800 mg total) by mouth every 8 (eight) hours as needed for pain.  21 tablet  0  . mometasone-formoterol (DULERA) 100-5 MCG/ACT AERO Inhale 2 puffs into the lungs as needed for wheezing.      . pravastatin (PRAVACHOL) 40 MG tablet Take 40 mg by mouth daily.        No current facility-administered medications for this visit.    Allergies  Allergen Reactions  . Codeine Itching    Patient Active Problem List   Diagnosis Date Noted  . Heart murmur, systolic 09/12/2013  . Pure hypercholesterolemia 09/12/2013  . Morbid obesity 09/12/2013  . Bifascicular block 09/12/2013  . Dyspnea 11/03/2012    History  Smoking status  . Never Smoker   Smokeless tobacco  . Never Used    History  Alcohol Use No    No family history on file.  Review of Systems: Constitutional: no fever chills diaphoresis or fatigue or change in  weight.  Head and neck: no hearing loss, no epistaxis, no photophobia or visual disturbance. Respiratory: Positive for shortness of breath and intermittent wheezing Cardiovascular: No chest pain peripheral edema, palpitations. Gastrointestinal: No abdominal distention, no abdominal pain, no change in bowel habits hematochezia or melena. Genitourinary: No dysuria, no frequency, no urgency, no nocturia. Musculoskeletal:No arthralgias, no back pain, no gait disturbance or myalgias. Neurological: No dizziness, no  headaches, no numbness, no seizures, no syncope, no weakness, no tremors. Hematologic: No lymphadenopathy, no easy bruising. Psychiatric: No confusion, no hallucinations, no sleep disturbance.    Physical Exam: Filed Vitals:   09/12/13 1050  BP: 128/90  Pulse: 75   the general appearance is that of a obese middle-age woman in no acute distress  Head and neck exam reveals that the pupils are equal and reactive.  The extraocular movements are full.  There is no scleral icterus.  Mouth and pharynx are benign.  No lymphadenopathy.  No carotid bruits.  The jugular venous pressure is normal.  Thyroid is not enlarged or tender.  Chest is clear to percussion and auscultation.  No rales or rhonchi.  Expansion of the chest is symmetrical.  Heart reveals no abnormal lift or heave.  First and second heart sounds are normal.  There is a grade 2/6 harsh systolic ejection murmur at the base.  No definite diastolic murmur heard.  No gallop or rub  The abdomen is soft and nontender and obese.  Bowel sounds are normoactive.  There is no hepatosplenomegaly or mass.  There are no abdominal bruits.  Extremities reveal no phlebitis or edema.  Pedal pulses are good.  There is no cyanosis or clubbing.  Neurologic exam is normal strength and no lateralizing weakness.  No sensory deficits.  Integument reveals no rash  EKG shows sinus rhythm with right bundle branch block and left posterior fascicular block.  No prior tracings are available to compare.  Assessment / Plan: 1. dyspnea, probably multifactorial and related in part to her morbid obesity and to her borderline elevated blood pressure.  Possibly also related to her cardiac murmurs. 2. systolic murmur question aortic stenosis. 3. Hypercholesterolemia 4. morbid obesity  Plan: Advised her to stop taking the phentermine. The patient will return for a two-dimensional echocardiogram to evaluate her heart murmurs and her dyspnea. We will also get a chest  x-ray.  Many thanks for the opportunity to see this pleasant woman with you.  I will be in touch with you regarding the results of her studies.

## 2013-09-12 NOTE — Patient Instructions (Addendum)
STOP PHENTERMINE  A chest x-ray takes a picture of the organs and structures inside the chest, including the heart, lungs, and blood vessels. This test can show several things, including, whether the heart is enlarges; whether fluid is building up in the lungs; and whether pacemaker / defibrillator leads are still in place. [facility]  Your physician has requested that you have an echocardiogram. Echocardiography is a painless test that uses sound waves to create images of your heart. It provides your doctor with information about the size and shape of your heart and how well your heart's chambers and valves are working. This procedure takes approximately one hour. There are no restrictions for this procedure.  FOLLOW UP AS NEEDED

## 2013-09-21 ENCOUNTER — Other Ambulatory Visit (HOSPITAL_COMMUNITY): Payer: Self-pay

## 2013-09-22 ENCOUNTER — Other Ambulatory Visit (HOSPITAL_COMMUNITY): Payer: Self-pay

## 2013-09-28 ENCOUNTER — Ambulatory Visit (HOSPITAL_COMMUNITY): Payer: 59 | Attending: Cardiology | Admitting: Radiology

## 2013-09-28 DIAGNOSIS — E7800 Pure hypercholesterolemia, unspecified: Secondary | ICD-10-CM | POA: Insufficient documentation

## 2013-09-28 DIAGNOSIS — R011 Cardiac murmur, unspecified: Secondary | ICD-10-CM

## 2013-09-28 DIAGNOSIS — I060 Rheumatic aortic stenosis: Secondary | ICD-10-CM

## 2013-09-28 DIAGNOSIS — I050 Rheumatic mitral stenosis: Secondary | ICD-10-CM | POA: Diagnosis present

## 2013-09-28 HISTORY — DX: Rheumatic aortic stenosis: I06.0

## 2013-09-28 HISTORY — DX: Rheumatic mitral stenosis: I05.0

## 2013-09-28 NOTE — Progress Notes (Signed)
Echocardiogram performed.  

## 2013-10-04 ENCOUNTER — Ambulatory Visit (INDEPENDENT_AMBULATORY_CARE_PROVIDER_SITE_OTHER): Payer: 59 | Admitting: Cardiology

## 2013-10-04 VITALS — BP 110/78 | HR 74 | Ht 60.0 in | Wt 246.0 lb

## 2013-10-04 DIAGNOSIS — I050 Rheumatic mitral stenosis: Secondary | ICD-10-CM

## 2013-10-04 NOTE — Patient Instructions (Addendum)
Your physician recommends that you continue on your current medications as directed. Please refer to the Current Medication list given to you today.  Work harder on diet and weight loss  USE TYLENOL INSTEAD OF IBUPROFEN  Your physician wants you to follow-up in: 6 month ov/ekg  You will receive a reminder letter in the mail two months in advance. If you don't receive a letter, please call our office to schedule the follow-up appointment.  Mitral Stenosis Mitral stenosis is a narrowing of the mitral valve. This is the valve between the upper (atrium) and lower (ventricle) chambers of the left side of the heart. Mitral stenosis is often discovered when your caregiver hears an abnormal sound (heart murmur) while listening to your heart.  CAUSES  Rheumatic fever (a complication of strep infection).  Buildup of calcium around the valve (can occur with aging).  Birth defect.  Damage from toxic medicines or radiation to the chest.  Inflammation of various tissues of the body (systemic lupus erythematosus).  Fabry's disease.  Whipple disease. SYMPTOMS  Symptoms of mitral stenosis include:  Shortness of breath.  Cough.  Wheezing.  Decreased energy or fatigue.  A fast or irregular heartbeat (palpitations).  Chest pain.  Pain in the arm, neck, jaw, or face.  Swollen feet or ankles. When babies are born with mitral stenosis, they usually develop symptoms within the first two years of life. These symptoms may include:  A blue color in their finger tips or toenails.  Shortness of breath.  Decreased energy.  Slow growth. DIAGNOSIS  Tests to diagnose mitral stenosis include:  Echocardiogram. This test takes an ultrasound picture of the heart. It allows your caregiver to see how the heart valves work when your heart beats.  Electrocardiogram (EKG). This test records electrical activity in the heart.  Cardiac catheterization. This test looks at the structure and function of  the heart. A tube device (catheter) is passed through the blood vessels and into the heart. Dye is injected into the blood vessels, allowing images to be taken of the cardiac system.  Chest X-ray. TREATMENT  Treatment for mitral stenosis depends on the severity of the condition. It may include:  Medicines to keep the heart rate regular.  Blood thinners (anticoagulants) to prevent the formation of blood clots.  Medicines (antibiotics) that kill germs to prevent infections. Defective heart valves are more prone to infection.  Open heart surgery may be needed to repair or replace the mitral valve. HOME CARE INSTRUCTIONS   Do not smoke.  Achieve and maintain a healthy weight.  Decrease your caffeine and alcohol intake. Both of these substances can affect your heart's rate and rhythm.  Find out what kind of exercise you are allowed to participate in.  Eat a heart healthy diet. A dietician can help you make healthy food choices.  Tell your dentist or doctor that you have mitral stenosis before any dental procedures. Antibiotics may be given to prevent a heart infection.  If you are a woman of childbearing age and have mitral stenosis, talk to your caregiver before becoming pregnant. SEEK IMMEDIATE MEDICAL CARE IF:   You have chest pain or pressure that does not go away.  You develop difficulty breathing.  You develop palpitations.  You develop increased fatigue.  You have swelling in your feet, ankles, or legs. MAKE SURE YOU:   Understand these instructions.  Will watch your condition.  Will get help right away if you are not doing well or get worse. Document Released:  06/18/2009 Document Revised: 01/03/2013 Document Reviewed: 04/05/2013 [facility] Patient Information 2015 [place], [place]. This information is not intended to replace advice given to you by your health care provider. Make sure you discuss any questions you have with your health care provider.

## 2013-10-04 NOTE — Progress Notes (Signed)
De Nurse Date of Birth:  08-May-1955 [facility] [address] [place], [place]  [address] [phone]        Fax   [phone]   History of Present Illness: This pleasant 58 year old African American woman is seen as a followup visit to discuss her recent echocardiogram.  The patient was initially seen at the request of Dr. Marjory Lies for evaluation of dyspnea.  She has a history of exogenous obesity and a history of chronic exertional dyspnea.  Review of her outside records reveals that she had had echocardiograms on 04/03/02 and again on 07/11/03.  The patient had forgotten that she had had previous echocardiograms.  2004 she was being evaluated for shortness of breath and a heart murmur and was found to have concentric LVH with normal systolic function and an estimated ejection fraction of 60%.  There was mildly thickened aortic valve with no evidence at that time of significant aortic stenosis and there was trivial aortic regurgitation.  She was felt to have mild to moderate mitral stenosis and trivial to mild mitral regurgitation and moderate tricuspid regurgitation. 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  She went on to have successful knee surgery Recently she has had more problems with her breathing.  She saw Dr. Casimiro Needle worked for pulmonary evaluation on 12/01/12.  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  At followup she was noted to have improved breathing. Recently she has had more problems with her shortness of breath.  She has also had a lot of hot flashes which she attributes to menopause.  She has had problems with morbid obesity  and has been taking phentermine.  She has not been experiencing any chest pain or symptoms to suggest angina pectoris. Her family history reveals her mother died of a stroke.  Her father died of prostate cancer.  There is no family history of heart murmurs. The patient herself denies any history of rheumatic fever as a child. The patient is a nonsmoker.  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 She had her echocardiogram on 09/28/13.  It showed normal left ventricular function.  There was elevated left ventricle filling pressure.  There was moderate to severe left atrial enlargement.  There was calcified rheumatic mitral valve with severe mitral stenosis with mean gradient of 20 there was calcified aortic valve with moderate aortic stenosis and trace aortic insufficiency and there was moderately elevated pulmonary artery pressures.  Current Outpatient Prescriptions  Medication Sig Dispense Refill  . acetaminophen (TYLENOL) 325 MG tablet Take 325 mg by mouth every 6 (six) hours as needed.      . cholecalciferol (VITAMIN D) 1000 UNITS tablet Take 1,000 Units by mouth daily. 2 tabs daily = 2,000      . mometasone-formoterol (DULERA) 100-5 MCG/ACT AERO Inhale 2 puffs into the lungs as needed for wheezing.      . pravastatin (PRAVACHOL) 40 MG tablet Take 40 mg by mouth daily.        No current facility-administered medications for this visit.    Allergies  Allergen Reactions  . Codeine Itching    Patient Active Problem List  Diagnosis Date Noted  . Mitral stenosis 10/04/2013  . Heart murmur, systolic 09/12/2013  . Pure hypercholesterolemia 09/12/2013  . Morbid obesity 09/12/2013  . Bifascicular block 09/12/2013  . Dyspnea 11/03/2012    History  Smoking status  . Never Smoker   Smokeless tobacco  . Never Used    History  Alcohol Use No    No family history on file.  Review of Systems: Constitutional: no fever chills diaphoresis or  fatigue or change in weight.  Head and neck: no hearing loss, no epistaxis, no photophobia or visual disturbance. Respiratory: Positive for shortness of breath and intermittent wheezing Cardiovascular: No chest pain peripheral edema, palpitations. Gastrointestinal: No abdominal distention, no abdominal pain, no change in bowel habits hematochezia or melena. Genitourinary: No dysuria, no frequency, no urgency, no nocturia. Musculoskeletal:No arthralgias, no back pain, no gait disturbance or myalgias. Neurological: No dizziness, no headaches, no numbness, no seizures, no syncope, no weakness, no tremors. Hematologic: No lymphadenopathy, no easy bruising. Psychiatric: No confusion, no hallucinations, no sleep disturbance.    Physical Exam: Filed Vitals:   10/04/13 0836  BP: 110/78  Pulse: 74   the general appearance is that of a obese middle-age woman in no acute distress  Head and neck exam reveals that the pupils are equal and reactive.  The extraocular movements are full.  There is no scleral icterus.  Mouth and pharynx are benign.  No lymphadenopathy.  No carotid bruits.  The jugular venous pressure is normal.  Thyroid is not enlarged or tender.  Chest is clear to percussion and auscultation.  No rales or rhonchi.  Expansion of the chest is symmetrical.  Heart reveals no abnormal lift or heave.  First and second heart sounds are normal.  There is a grade 2/6 harsh systolic ejection murmur at the base.  No definite diastolic murmur heard.  No gallop or rub  The abdomen is soft and nontender and obese.  Bowel sounds are normoactive.  There is no hepatosplenomegaly or mass.  There are no abdominal bruits.  Extremities reveal no phlebitis or edema.  Pedal pulses are good.  There is no cyanosis or clubbing.  Neurologic exam is normal strength and no lateralizing weakness.  No sensory deficits.  Integument reveals no rash  EKG shows sinus rhythm with right bundle branch block and left  posterior fascicular block.  No prior tracings are available to compare.  Assessment / Plan: 1. dyspnea, probably multifactorial and related in part to her morbid obesity and to her borderline elevated blood pressure.  Possibly also related to her cardiac murmurs. 2. systolic murmur question aortic stenosis. 3. Hypercholesterolemia 4. morbid obesity  Disposition: In view of her echocardiographic findings we explored in detail concerning her present cardiac symptoms.  She has surprisingly few symptoms compared to the severity of her mitral stenosis she is still able to work a full day and sometimes works at 12 hour shift.  She sleeps on one pillow.  She rarely has any orthopnea.  She has not been experiencing any pedal edema or fluid retention.  She remains in normal sinus rhythm. In essence, she did not think that she was having enough cardiac symptoms at this point that she would want to proceed along the path of cardiac catheterization and valve replacement yet.  This decision I think is reasonable.  We will continue to follow her closely.  We gave her a printout concerning mitral stenosis.  She is going to try to lose weight which will be important.  I asked her to use Tylenol rather than ibuprofen or other NSAIDS.  I have asked her to let us check her again in 6 months for followup office visit and EKG, or sooner if her symptoms should begin to worsen.

## 2013-10-10 ENCOUNTER — Ambulatory Visit: Payer: 59

## 2014-04-20 ENCOUNTER — Ambulatory Visit: Payer: Self-pay | Admitting: Cardiology

## 2014-05-15 ENCOUNTER — Ambulatory Visit (INDEPENDENT_AMBULATORY_CARE_PROVIDER_SITE_OTHER): Payer: 59 | Admitting: Cardiology

## 2014-05-15 ENCOUNTER — Encounter: Payer: Self-pay | Admitting: Cardiology

## 2014-05-15 VITALS — BP 120/86 | HR 71 | Ht 60.0 in | Wt 235.0 lb

## 2014-05-15 DIAGNOSIS — R0609 Other forms of dyspnea: Secondary | ICD-10-CM | POA: Diagnosis not present

## 2014-05-15 DIAGNOSIS — I050 Rheumatic mitral stenosis: Secondary | ICD-10-CM | POA: Diagnosis not present

## 2014-05-15 DIAGNOSIS — I452 Bifascicular block: Secondary | ICD-10-CM

## 2014-05-15 DIAGNOSIS — R011 Cardiac murmur, unspecified: Secondary | ICD-10-CM | POA: Diagnosis not present

## 2014-05-15 NOTE — Progress Notes (Signed)
**Note [name]-Identified via Obfuscation** Cardiology Office Note   Date:  05/15/2014   ID:  [name] [name], DOB [birth year], MRN [id number]  PCP:  Delorse LekBURNETT,BRENT A, MD  Cardiologist: Cassell Clementhomas Nyajah Hyson MD  No chief complaint on file.     History of Present Illness: [name] [name] is a 59 y.o. female who presents for scheduled follow-up visit  This pleasant 59 year old African American woman is seen for a six-month follow-up office visit. The patient was initially seen at the request of Dr. Marjory LiesBrent Burnett for evaluation of dyspnea. She has a history of exogenous obesity and a history of chronic exertional dyspnea. Review of her outside records reveals that she had had echocardiograms on 04/03/02 and again on 07/11/03. The patient had forgotten that she had had previous echocardiograms. 2004 she was being evaluated for shortness of breath and a heart murmur and was found to have concentric LVH with normal systolic function and an estimated ejection fraction of 60%. There was mildly thickened aortic valve with no evidence at that time of significant aortic stenosis and there was trivial aortic regurgitation. She was felt to have mild to moderate mitral stenosis and trivial to mild mitral regurgitation and moderate tricuspid regurgitation. 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 She went on to have successful knee surgery Recently she has had more problems with her breathing. She saw Dr. Sandrea HughsMichael Wert for pulmonary evaluation on 12/01/12. 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 At followup she was noted to have improved breathing.  Her family history reveals her mother died of a stroke. Her father died of  prostate cancer. There is no family history of heart murmurs. The patient herself denies any history of rheumatic fever as a child. The patient is a nonsmoker. 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 She had her echocardiogram on 09/28/13. It showed normal left ventricular function. There was elevated left ventricle filling pressure. There was moderate to severe left atrial enlargement. There was calcified rheumatic mitral valve with severe mitral stenosis with mean gradient of 20 there was calcified aortic valve with moderate aortic stenosis and trace aortic insufficiency and there was moderately elevated pulmonary artery pressures. Since last visit she has not had any symptoms of progressive heart failure or shortness of breath.  She sleeps on one pillow.  She does not have any paroxysmal nocturnal dyspnea.  Her weight is down 11 pounds since September 2015. She is on statin therapy.  She is not having any myalgias. Past Medical History  Diagnosis Date  . Asthma   . Obesity     Past Surgical History  Procedure Laterality Date  . Abdominal hysterectomy    . Knee arthroscopy       Current Outpatient Prescriptions  Medication Sig Dispense Refill  . b complex vitamins capsule Take 1 capsule by mouth daily.    . cholecalciferol (VITAMIN D) 1000 UNITS tablet Take 1,000 Units by mouth daily. 2 tabs daily = 2,000    . Coenzyme Q10 (COQ-10) 10 MG CAPS Take 1 capsule by mouth daily.    . mometasone-formoterol (DULERA) 100-5 MCG/ACT AERO Inhale 2 puffs into the lungs as needed for wheezing.    Marland Kitchen. OMEGA-3  FATTY ACIDS PO Take 1,600 mg by mouth daily.    . pravastatin (PRAVACHOL) 40 MG tablet Take 40 mg by mouth daily.      No current facility-administered medications for this visit.    Allergies:   Codeine    Social History:  The patient  reports that she has never smoked. She has never used smokeless tobacco. She reports that she does not drink  alcohol or use illicit drugs.   Family History:  The patient's family history includes Prostate cancer in her father; Stroke in her mother.    ROS:  Please see the history of present illness.   Otherwise, review of systems are positive for none.   All other systems are reviewed and negative.    PHYSICAL EXAM: VS:  BP 120/86 mmHg  Pulse 71  Ht 5' (1.524 m)  Wt 235 lb (106.595 kg)  BMI 45.90 kg/m2 , BMI Body mass index is 45.9 kg/(m^2). GEN: Well nourished, well developed, in no acute distress HEENT: normal Neck: no JVD, carotid bruits, or masses Cardiac: RRR; grade 2/6 systolic murmur at left sternal edge.  No diastolic murmur heard, or gallops,no edema  Respiratory:  clear to auscultation bilaterally, normal work of breathing GI: soft, nontender, nondistended, + BS MS: no deformity or atrophy Skin: warm and dry, no rash Neuro:  Strength and sensation are intact Psych: euthymic mood, full affect   EKG:  EKG is ordered today. The ekg ordered today demonstrates normal sinus rhythm at 72 bpm.  Possible left atrial enlargement.  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  Since 09/12/13, no significant change   Recent Labs: No results found for requested labs within last 365 days.    Lipid Panel No results found for: CHOL, TRIG, HDL, CHOLHDL, VLDL, LDLCALC, LDLDIRECT    Wt Readings from Last 3 Encounters:  05/15/14 235 lb (106.595 kg)  10/04/13 246 lb (111.585 kg)  09/12/13 249 lb 6.4 oz (113.127 kg)         ASSESSMENT AND PLAN:  1. dyspnea, probably multifactorial and related in part to her morbid obesity and to her borderline elevated blood pressure. Possibly also related to her cardiac murmurs. 2. systolic murmur question aortic stenosis. 3. Hypercholesterolemia 4. morbid obesity  Disposition: As per our discussion at her last visit, she is not having enough symptoms yet to warrant proceeding with cardiac  catheterization.  Continue current medication.  Recheck in 6 months for office visit and fasting lab work   Current medicines are reviewed at length with the patient today.  The patient does not have concerns regarding medicines.  The following changes have been made:  no change  Labs/ tests ordered today include:  No orders of the defined types were placed in this encounter.      Karie Schwalbe MD 05/15/2014 7:43 PM    [facility] [address] [place], [place], [place]  [address] Phone: [phone]; Fax: [phone]

## 2014-05-15 NOTE — Patient Instructions (Signed)
Medication Instructions:  Your physician recommends that you continue on your current medications as directed. Please refer to the Current Medication list given to you today.  Labwork: none  Testing/Procedures: none  Follow-Up: Your physician wants you to follow-up in: 6 months with fasting labs (lp/bmet/hfp)  You will receive a reminder letter in the mail two months in advance. If you don't receive a letter, please call our office to schedule the follow-up appointment.    

## 2014-05-19 ENCOUNTER — Encounter (HOSPITAL_BASED_OUTPATIENT_CLINIC_OR_DEPARTMENT_OTHER): Payer: Self-pay

## 2014-05-19 ENCOUNTER — Emergency Department (HOSPITAL_BASED_OUTPATIENT_CLINIC_OR_DEPARTMENT_OTHER)
Admission: EM | Admit: 2014-05-19 | Discharge: 2014-05-20 | Disposition: A | Discharge status: Home / Self Care | Payer: 59 | Attending: Emergency Medicine | Admitting: Emergency Medicine

## 2014-05-19 ENCOUNTER — Emergency Department (HOSPITAL_BASED_OUTPATIENT_CLINIC_OR_DEPARTMENT_OTHER): Payer: 59

## 2014-05-19 DIAGNOSIS — E669 Obesity, unspecified: Secondary | ICD-10-CM | POA: Insufficient documentation

## 2014-05-19 DIAGNOSIS — J45909 Unspecified asthma, uncomplicated: Secondary | ICD-10-CM | POA: Diagnosis not present

## 2014-05-19 DIAGNOSIS — Z7952 Long term (current) use of systemic steroids: Secondary | ICD-10-CM | POA: Insufficient documentation

## 2014-05-19 DIAGNOSIS — K5900 Constipation, unspecified: Secondary | ICD-10-CM | POA: Insufficient documentation

## 2014-05-19 DIAGNOSIS — R10816 Epigastric abdominal tenderness: Secondary | ICD-10-CM

## 2014-05-19 DIAGNOSIS — Z79899 Other long term (current) drug therapy: Secondary | ICD-10-CM | POA: Diagnosis not present

## 2014-05-19 DIAGNOSIS — E780 Pure hypercholesterolemia: Secondary | ICD-10-CM | POA: Insufficient documentation

## 2014-05-19 DIAGNOSIS — Z90710 Acquired absence of both cervix and uterus: Secondary | ICD-10-CM | POA: Insufficient documentation

## 2014-05-19 DIAGNOSIS — R110 Nausea: Secondary | ICD-10-CM | POA: Diagnosis not present

## 2014-05-19 DIAGNOSIS — R1013 Epigastric pain: Secondary | ICD-10-CM | POA: Diagnosis present

## 2014-05-19 DIAGNOSIS — R011 Cardiac murmur, unspecified: Secondary | ICD-10-CM | POA: Insufficient documentation

## 2014-05-19 HISTORY — DX: Cardiac murmur, unspecified: R01.1

## 2014-05-19 HISTORY — DX: Pure hypercholesterolemia, unspecified: E78.00

## 2014-05-19 LAB — URINALYSIS, ROUTINE W REFLEX MICROSCOPIC
Bilirubin Urine: NEGATIVE
Glucose, UA: NEGATIVE mg/dL
Hgb urine dipstick: NEGATIVE
Ketones, ur: NEGATIVE mg/dL
Nitrite: NEGATIVE
Protein, ur: NEGATIVE mg/dL
Specific Gravity, Urine: 1.014 (ref 1.005–1.030)
Urobilinogen, UA: 1 mg/dL (ref 0.0–1.0)
pH: 7.5 (ref 5.0–8.0)

## 2014-05-19 LAB — CBC WITH DIFFERENTIAL/PLATELET
BASOS PCT: 1 % (ref 0–1)
Basophils Absolute: 0.1 10*3/uL (ref 0.0–0.1)
EOS PCT: 1 % (ref 0–5)
Eosinophils Absolute: 0 10*3/uL (ref 0.0–0.7)
HCT: 39.9 % (ref 36.0–46.0)
HEMOGLOBIN: 13.4 g/dL (ref 12.0–15.0)
Lymphocytes Relative: 6 % — ABNORMAL LOW (ref 12–46)
Lymphs Abs: 0.4 10*3/uL — ABNORMAL LOW (ref 0.7–4.0)
MCH: 29.5 pg (ref 26.0–34.0)
MCHC: 33.6 g/dL (ref 30.0–36.0)
MCV: 87.7 fL (ref 78.0–100.0)
MONO ABS: 0.3 10*3/uL (ref 0.1–1.0)
MONOS PCT: 4 % (ref 3–12)
NEUTROS PCT: 88 % — AB (ref 43–77)
Neutro Abs: 6 10*3/uL (ref 1.7–7.7)
Platelets: 191 10*3/uL (ref 150–400)
RBC: 4.55 MIL/uL (ref 3.87–5.11)
RDW: 15 % (ref 11.5–15.5)
WBC: 6.8 10*3/uL (ref 4.0–10.5)

## 2014-05-19 LAB — COMPREHENSIVE METABOLIC PANEL
ALK PHOS: 103 U/L (ref 38–126)
ALT: 19 U/L (ref 14–54)
AST: 22 U/L (ref 15–41)
Albumin: 3.9 g/dL (ref 3.5–5.0)
Anion gap: 10 (ref 5–15)
BUN: 19 mg/dL (ref 6–20)
CALCIUM: 9.3 mg/dL (ref 8.9–10.3)
CO2: 25 mmol/L (ref 22–32)
Chloride: 104 mmol/L (ref 101–111)
Creatinine, Ser: 0.9 mg/dL (ref 0.44–1.00)
GLUCOSE: 128 mg/dL — AB (ref 70–99)
Potassium: 4.1 mmol/L (ref 3.5–5.1)
SODIUM: 139 mmol/L (ref 135–145)
TOTAL PROTEIN: 7.1 g/dL (ref 6.5–8.1)
Total Bilirubin: 0.9 mg/dL (ref 0.3–1.2)

## 2014-05-19 LAB — LIPASE, BLOOD: Lipase: 27 U/L (ref 22–51)

## 2014-05-19 LAB — URINE MICROSCOPIC-ADD ON

## 2014-05-19 MED ORDER — HYDROCODONE-ACETAMINOPHEN 5-325 MG PO TABS
1.0000 | ORAL_TABLET | Freq: Once | ORAL | Status: AC
Start: 2014-05-20 — End: 2014-05-19
  Administered 2014-05-19: 1 via ORAL
  Filled 2014-05-19: qty 1

## 2014-05-19 MED ORDER — PANTOPRAZOLE SODIUM 40 MG IV SOLR
40.0000 mg | Freq: Once | INTRAVENOUS | Status: AC
  Administered 2014-05-19: 40 mg via INTRAVENOUS
  Filled 2014-05-19: qty 40

## 2014-05-19 MED ORDER — ONDANSETRON HCL 4 MG/2ML IJ SOLN
4.0000 mg | Freq: Once | INTRAMUSCULAR | Status: AC
  Administered 2014-05-19: 4 mg via INTRAVENOUS
  Filled 2014-05-19: qty 2

## 2014-05-19 MED ORDER — SODIUM CHLORIDE 0.9 % IV BOLUS (SEPSIS)
1000.0000 mL | Freq: Once | INTRAVENOUS | Status: AC
Start: 2014-05-19 — End: 2014-05-20
  Administered 2014-05-19: 1000 mL via INTRAVENOUS

## 2014-05-19 NOTE — ED Notes (Signed)
PA-C performed rectal exam with myself at the bedside.

## 2014-05-19 NOTE — ED Notes (Signed)
Pt reports abd pain, constipation and today with vomiting.  Last normal bm 2 days ago, usually goes daily.

## 2014-05-19 NOTE — ED Provider Notes (Signed)
CSN: [id number]     Arrival date & time 05/19/14  2154 History   First MD Initiated Contact with Patient 05/19/14 2234     Chief Complaint  Patient presents with  . Abdominal Pain     (Consider location/radiation/quality/duration/timing/severity/associated sxs/prior Treatment) HPI  De Nurseamela M Tate is a 59 y.o. female with PMH of asthma, obesity, dyslipidemia, heart murmur presenting with nausea and vomiting one hour after church lunch in followed by epigastric abdominal pain described as "stomach upset." pt states she has vomited 3 times nonbloody nonbilious. Patient denies alleviating or aggravating factors. Denies fevers, chills. Last BM yesterday and normal without blood or black stool. Patient states she has been belching. Abdominal surgeries include abdominal hysterectomy. No back pain.    Past Medical History  Diagnosis Date  . Asthma   . Obesity   . Hypercholesteremia   . Heart murmur    Past Surgical History  Procedure Laterality Date  . Abdominal hysterectomy    . Knee arthroscopy     Family History  Problem Relation Age of Onset  . Stroke Mother   . Prostate cancer Father    History  Substance Use Topics  . Smoking status: Never Smoker   . Smokeless tobacco: Never Used  . Alcohol Use: No   OB History    No data available     Review of Systems 10 Systems reviewed and are negative for acute change except as noted in the HPI.    Allergies  Codeine  Home Medications   Prior to Admission medications   Medication Sig Start Date End Date Taking? Authorizing Provider  b complex vitamins capsule Take 1 capsule by mouth daily.    Historical Provider, MD  cholecalciferol (VITAMIN D) 1000 UNITS tablet Take 1,000 Units by mouth daily. 2 tabs daily = 2,000    Historical Provider, MD  Coenzyme Q10 (COQ-10) 10 MG CAPS Take 1 capsule by mouth daily.    Historical Provider, MD  docusate sodium (COLACE) 100 MG capsule Take 1 capsule (100 mg total) by mouth every 12  (twelve) hours. 05/20/14   Oswaldo ConroyVictoria Jaquay Morneault, PA-C  mometasone-formoterol (DULERA) 100-5 MCG/ACT AERO Inhale 2 puffs into the lungs as needed for wheezing.    Historical Provider, MD  OMEGA-3 FATTY ACIDS PO Take 1,600 mg by mouth daily.    Historical Provider, MD  ondansetron (ZOFRAN) 4 MG tablet Take 1 tablet (4 mg total) by mouth every 8 (eight) hours as needed for nausea or vomiting. 05/20/14   Oswaldo ConroyVictoria Eriyonna Matsushita, PA-C  pravastatin (PRAVACHOL) 40 MG tablet Take 40 mg by mouth daily.  09/11/13   Historical Provider, MD  ranitidine (ZANTAC) 150 MG tablet Take 1 tablet (150 mg total) by mouth 2 (two) times daily. 05/20/14   Oswaldo ConroyVictoria Miquan Tandon, PA-C   BP 118/78 mmHg  Pulse 72  Temp(Src) 98.7 F (37.1 C) (Oral)  Resp 18  Ht 5' (1.524 m)  Wt 234 lb (106.142 kg)  BMI 45.70 kg/m2  SpO2 97% Physical Exam  Constitutional: She appears well-developed and well-nourished. No distress.  HENT:  Head: Normocephalic and atraumatic.  Mouth/Throat: Oropharynx is clear and moist.  Eyes: Conjunctivae and EOM are normal. Right eye exhibits no discharge. Left eye exhibits no discharge.  Cardiovascular: Normal rate and regular rhythm.   Pulmonary/Chest: Effort normal and breath sounds normal. No respiratory distress. She has no wheezes.  Abdominal: Soft. Bowel sounds are normal. She exhibits no distension.  Mild epigastric abdominal tenderness without rebound, rigidity, guarding.  Genitourinary:  External  rectum without tenderness to palpation without visible fissures. Internal rectum with normal tone without masses or lesions. Large amount of brown stool in rectal vault. No significant pain with rectal exam. Nurse in room during exam.   Neurological: She is alert. She exhibits normal muscle tone. Coordination normal.  Skin: Skin is warm and dry. She is not diaphoretic.  Nursing note and vitals reviewed.   ED Course  Procedures (including critical care time) Labs Review Labs Reviewed  URINALYSIS, ROUTINE W REFLEX  MICROSCOPIC - Abnormal; Notable for the following:    APPearance CLOUDY (*)    Leukocytes, UA MODERATE (*)    All other components within normal limits  URINE MICROSCOPIC-ADD ON - Abnormal; Notable for the following:    Bacteria, UA FEW (*)    All other components within normal limits  CBC WITH DIFFERENTIAL/PLATELET - Abnormal; Notable for the following:    Neutrophils Relative % 88 (*)    Lymphocytes Relative 6 (*)    Lymphs Abs 0.4 (*)    All other components within normal limits  COMPREHENSIVE METABOLIC PANEL - Abnormal; Notable for the following:    Glucose, Bld 128 (*)    All other components within normal limits  LIPASE, BLOOD    Imaging Review Dg Abd Acute W/chest  05/20/2014   CLINICAL DATA:  Initial evaluation for acute abdominal pain.  EXAM: DG ABDOMEN ACUTE W/ 1V CHEST  COMPARISON:  Prior study from 09/12/2013  FINDINGS: Mild cardiomegaly is stable. Mediastinal silhouette within normal limits.  The lungs are normally inflated. Diffuse peribronchial thickening likely related to history of asthma. No airspace consolidation, pleural effusion, or pulmonary edema is identified. There is no pneumothorax.  Paucity of gas limits evaluation the bowels. No evidence for obstruction or ileus. No abnormal bowel wall thickening. Gas lucency present within the gastric fundus. No soft tissue mass or abnormal calcification. Moderate amount of retained stool within the right colon.  No acute osseus abnormality.  IMPRESSION: 1. Nonobstructive bowel gas pattern with no radiographic evidence for acute intra-abdominal process. 2. Moderate amount of retained stool within the colon, which may reflect constipation. 3. Diffuse peribronchial thickening, likely related to history of asthma. No other active cardiopulmonary disease. 4. Stable mild cardiomegaly.   Electronically Signed   By: Rise MuBenjamin  McClintock M.D.   On: 05/20/2014 00:24     EKG Interpretation None      Meds given in ED:  Medications   sodium chloride 0.9 % bolus 1,000 mL (0 mLs Intravenous Stopped 05/20/14 0032)  ondansetron (ZOFRAN) injection 4 mg (4 mg Intravenous Given 05/19/14 2317)  pantoprazole (PROTONIX) injection 40 mg (40 mg Intravenous Given 05/19/14 2317)  HYDROcodone-acetaminophen (NORCO/VICODIN) 5-325 MG per tablet 1 tablet (1 tablet Oral Given 05/19/14 2351)    Discharge Medication List as of 05/20/2014 12:16 AM    START taking these medications   Details  docusate sodium (COLACE) 100 MG capsule Take 1 capsule (100 mg total) by mouth every 12 (twelve) hours., Starting 05/20/2014, Until Discontinued, Print    ondansetron (ZOFRAN) 4 MG tablet Take 1 tablet (4 mg total) by mouth every 8 (eight) hours as needed for nausea or vomiting., Starting 05/20/2014, Until Discontinued, Print    ranitidine (ZANTAC) 150 MG tablet Take 1 tablet (150 mg total) by mouth 2 (two) times daily., Starting 05/20/2014, Until Discontinued, Print          MDM   Final diagnoses:  Epigastric abdominal tenderness  Constipation, unspecified constipation type  Nausea   Patient presenting  with epigastric abdominal tenderness after vomiting 1 hour after eating [employer]. VSS. Patient's pain managed in the ED. Nonsurgical abdomen. Rectal exam with large amount of stool. She abdominal series without evidence of obstruction with evidence of likely constipation. Labwork reassuring. Pt with asymptomatic bacteriuria. No antibiotics indicated. Repeat abdominal exam patient with no abdominal tenderness. I doubt acute abdominal process. Pt tolerating fluids in ED. Patient likely with constipation. Discussed miralax, stool softeners, Metamucil and follow-up with PCP.  Discussed return precautions with patient. Discussed all results and patient verbalizes understanding and agrees with plan.  Case has been discussed with Dr. Juleen China who agrees with the above plan and to discharge.      Oswaldo Conroy, PA-C 05/20/14 1827  Raeford Razor,  MD 05/22/14 [phone]

## 2014-05-20 MED ORDER — DOCUSATE SODIUM 100 MG PO CAPS: 100.0000 mg | ORAL_CAPSULE | Freq: Two times a day (BID) | ORAL | Status: DC

## 2014-05-20 MED ORDER — ONDANSETRON HCL 4 MG PO TABS
4.0000 mg | ORAL_TABLET | Freq: Three times a day (TID) | ORAL | Status: DC | PRN
Start: 2014-05-20 — End: 2014-09-03

## 2014-05-20 MED ORDER — RANITIDINE HCL 150 MG PO TABS: 150.0000 mg | ORAL_TABLET | Freq: Two times a day (BID) | ORAL | Status: DC

## 2014-05-20 NOTE — Discharge Instructions (Signed)
Return to the emergency room with worsening of symptoms, new symptoms or with symptoms that are concerning , especially fevers, abdominal pain in one area, unable to keep down fluids, blood in stool or vomit, severe pain, you feel faint, lightheaded or pass out. Start taking metamucil daily and colace. Miralax: 3 capfuls in large Gatorade over the course of the day when stool becomes loose decrease to one capful a day for 1 week. zofran for nausea. Please call your doctor for a followup appointment within 24-48 hours. When you talk to your doctor please let them know that you were seen in the emergency department and have them acquire all of your records so that they can discuss the findings with you and formulate a treatment plan to fully care for your new and ongoing problems. Read below information and follow recommendations.  Abdominal Pain Many things can cause abdominal pain. Usually, abdominal pain is not caused by a disease and will improve without treatment. It can often be observed and treated at home. Your health care provider will do a physical exam and possibly order blood tests and X-rays to help determine the seriousness of your pain. However, in many cases, more time must pass before a clear cause of the pain can be found. Before that point, your health care provider may not know if you need more testing or further treatment. HOME CARE INSTRUCTIONS  Monitor your abdominal pain for any changes. The following actions may help to alleviate any discomfort you are experiencing:  Only take over-the-counter or prescription medicines as directed by your health care provider.  Do not take laxatives unless directed to do so by your health care provider.  Try a clear liquid diet (broth, tea, or water) as directed by your health care provider. Slowly move to a bland diet as tolerated. SEEK MEDICAL CARE IF:  You have unexplained abdominal pain.  You have abdominal pain associated with nausea  or diarrhea.  You have pain when you urinate or have a bowel movement.  You experience abdominal pain that wakes you in the night.  You have abdominal pain that is worsened or improved by eating food.  You have abdominal pain that is worsened with eating fatty foods.  You have a fever. SEEK IMMEDIATE MEDICAL CARE IF:   Your pain does not go away within 2 hours.  You keep throwing up (vomiting).  Your pain is felt only in portions of the abdomen, such as the right side or the left lower portion of the abdomen.  You pass bloody or black tarry stools. MAKE SURE YOU:  Understand these instructions.   Will watch your condition.   Will get help right away if you are not doing well or get worse.  Document Released: 10/08/2004 Document Revised: 01/03/2013 Document Reviewed: 09/07/2012 [facility] Patient Information 2015 [employer] [place] [place]. This information is not intended to replace advice given to you by your health care provider. Make sure you discuss any questions you have with your health care provider.

## 2014-05-28 ENCOUNTER — Other Ambulatory Visit: Payer: Self-pay | Admitting: *Deleted

## 2014-05-28 DIAGNOSIS — E7800 Pure hypercholesterolemia, unspecified: Secondary | ICD-10-CM

## 2014-06-08 ENCOUNTER — Ambulatory Visit: Payer: Self-pay | Admitting: Cardiology

## 2014-08-28 ENCOUNTER — Ambulatory Visit (INDEPENDENT_AMBULATORY_CARE_PROVIDER_SITE_OTHER): Payer: Commercial Managed Care - HMO | Admitting: Internal Medicine

## 2014-08-28 ENCOUNTER — Ambulatory Visit (INDEPENDENT_AMBULATORY_CARE_PROVIDER_SITE_OTHER)
Admission: RE | Admit: 2014-08-28 | Discharge: 2014-08-28 | Disposition: A | Discharge status: Home / Self Care | Payer: Commercial Managed Care - HMO | Source: Ambulatory Visit | Attending: Internal Medicine | Admitting: Internal Medicine

## 2014-08-28 ENCOUNTER — Encounter: Payer: Self-pay | Admitting: Internal Medicine

## 2014-08-28 VITALS — BP 108/78 | HR 94 | Ht 62.0 in | Wt 235.6 lb

## 2014-08-28 DIAGNOSIS — R058 Other specified cough: Secondary | ICD-10-CM | POA: Insufficient documentation

## 2014-08-28 DIAGNOSIS — R05 Cough: Secondary | ICD-10-CM | POA: Diagnosis not present

## 2014-08-28 DIAGNOSIS — J45991 Cough variant asthma: Secondary | ICD-10-CM | POA: Diagnosis not present

## 2014-08-28 MED ORDER — FAMOTIDINE 20 MG PO TABS: ORAL_TABLET | ORAL | Status: DC

## 2014-08-28 MED ORDER — PANTOPRAZOLE SODIUM 40 MG PO TBEC: 40.0000 mg | DELAYED_RELEASE_TABLET | Freq: Every day | ORAL | Status: AC

## 2014-08-28 MED ORDER — FLUTTER DEVI: Status: DC

## 2014-08-28 NOTE — Progress Notes (Signed)
Subjective:   Patient ID: [name] [name], female    DOB: Aug 01, 1955   MRN: [id number]    Brief patient profile:  64  yobf no problem as child with resp problems never smoker then onset of asthma at 50 came and went until 2013 but persistent symptoms of doe down the hallway since then gradually worse esp when hot referred by Dr Doristine Counter 11/03/2012 to pulmonary clinic.   History of Present Illness   11/03/2012 1st [facility] office visit/ [name] [name] cc indolent onset progressively worse doe  x hallway at work and while swimming at wt 240 prior to onset of these symptoms.  No real change activity tol  p albuterol or after starting pulmocort.  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  GERD diet   12/01/2012 f/u ov/[name] [name] re: pseudoasthma Chief Complaint  Patient presents with  . Follow-up    Pt reports her breathing is much improved. Using rescue inhaler on average about 5 times per wk.   typically only uses saba p exertion and does not rechallenge  rec Pantoprazole (protonix) 40 mg   Take 30-60 min before first meal of the day and Pepcid 20 mg one bedtime until return  GERD  Diet  Please schedule a follow up visit in 3 months - did not return because felt better   08/28/2014 acute  ov/[name] [name] re: cough c/w Recurrent uacs Chief Complaint  Patient presents with  . Acute Visit    Pt c/o chest pain due to non productive cough x 7 days. Pt also c/o fatigue, nasal and chest congestion, and increased SOB. Pt denies fever, chills, and wheezing. Pt states being exposed to large amount of dust before becoming sick        On maint on prn dulera 100 2 weeks prior to OV   exposed to dust then  severe dry cough then diffuse cp from cough / only hurts with coughing fits  Settles down somwhat at hs  Saw burnett 8/15 rx pred/ new inhaler (proventil) and cough medication > no better   No obvious day to day or daytime variabilty or assoc   chest tightness, subjective wheeze overt sinus or hb symptoms. No unusual exp hx or h/o childhood pna/ asthma or knowledge of premature birth.  Sleeping ok without nocturnal  or early am exacerbation  of respiratory  c/o's or need for noct saba. Also denies any obvious fluctuation of symptoms with weather or environmental changes or other aggravating or alleviating factors except as outlined above   Current Medications, Allergies, Complete Past Medical History, Past Surgical History, Family History, and Social History were reviewed in [employer] record.  ROS  The following are not active complaints unless bolded sore throat, dysphagia, dental problems, itching, sneezing,  nasal congestion or excess/ purulent secretions, ear ache,   fever, chills, sweats, unintended wt loss, pleuritic or exertional cp, hemoptysis,  orthopnea pnd or leg swelling, presyncope, palpitations, heartburn, abdominal pain, anorexia, nausea, vomiting, diarrhea  or change in bowel or urinary habits, change in stools or urine, dysuria,hematuria,  rash, arthralgias, visual complaints, headache, numbness weakness or ataxia or problems with walking  or coordination,  change in mood/affect or memory.                       Objective:   Physical Exam  12/01/2012      264 > 08/28/2014 235  Wt Readings from Last 3 Encounters:  11/03/12 267 lb 6.4 oz (121.292 kg)  07/04/12 261 lb (118.389 kg)  03/20/12 245 lb (111.131 kg)      Obese bf nad with prominent  pseudowheeze and barking / honking quality upper airway cough   HEENT: nl dentition, turbinates, and orophanx. Nl external ear canals without cough reflex   NECK :  without JVD/Nodes/TM/ nl carotid  upstrokes bilaterally   LUNGS: no acc muscle use, clear to A and P bilaterally without cough on insp or exp maneuvers   CV:  RRR  no s3 or murmur or increase in P2, no edema   ABD:  soft and nontender with nl excursion in the supine position. No bruits or organomegaly, bowel sounds nl  MS:  warm without deformities, calf tenderness, cyanosis or clubbing  SKIN: warm and dry without lesions    NEURO:  alert, approp, no deficits     Spirometry 11/03/2012 wnl including fef 25-75    I personally reviewed images and agree with radiology impression as follows:  CXR:  08/28/2014 There is no edema or consolidation. Heart is upper normal in size with pulmonary vascularity within normal limits. No adenopathy. No pneumothorax. No bone lesions      Assessment & Plan:

## 2014-08-28 NOTE — Patient Instructions (Addendum)
Increase the cough medication to whatever dose you need to stop coughing and let me know if you need more   Coughing into the flutter valve will prevent you from traumatizing your airway with the cough which makes your cough even worse  Pantoprazole (protonix) 40 mg   Take  30-60 min before first meal of the day and Pepcid (famotidine)  20 mg one @  bedtime until  No cough at all  Please remember to go to the  x-ray department downstairs for your tests - we will call you with the results when they are available.  Only use the dulera if you can't catch your breath once you stop the cough first   GERD (REFLUX)  is an extremely common cause of respiratory symptoms just like yours , many times with no obvious heartburn at all.    It can be treated with medication, but also with lifestyle changes including elevation of the head of your bed (ideally with 6 inch  bed blocks),  Smoking cessation, avoidance of late meals, excessive alcohol, and avoid fatty foods, chocolate, peppermint, colas, red wine, and acidic juices such as orange juice.  NO MINT OR MENTHOL PRODUCTS SO NO COUGH DROPS  USE SUGARLESS CANDY INSTEAD (Jolley ranchers or Stover's or Life Savers) or even ice chips will also do - the key is to swallow to prevent all throat clearing. NO OIL BASED VITAMINS - use powdered substitutes.  Return if not 100% better in 2 weeks please return with all meds in hand

## 2014-08-31 ENCOUNTER — Telehealth: Payer: Self-pay | Admitting: Internal Medicine

## 2014-08-31 MED ORDER — BENZONATATE 200 MG PO CAPS: 200.0000 mg | ORAL_CAPSULE | Freq: Three times a day (TID) | ORAL | Status: DC | PRN

## 2014-08-31 NOTE — Telephone Encounter (Signed)
Pt c/o increased cough which is causing her to be nauseated. Pt reports having severe coughing spells which results in vomiting d/t the increased nausea. Pt states that she has been using Flutter Valve as directed. Taking all meds as directed. Pt states that she returns to work next week and is very active in her job. Needing something to help with her cough suppression. Has tried OTC Delsym - not working 100%, has given some relief. Requesting something be called into pharmacy.   Dr. Kendrick Fries, Please advise.

## 2014-08-31 NOTE — Telephone Encounter (Signed)
Please advise also Dr Kendrick Fries, patient is needing a note for work 08/30/14 - 09/05/2014. Pt plans to go back at the end of the week next week. Please advise if you are okay writing work note in Dr Rolin Barry absence. Thanks.

## 2014-08-31 NOTE — Telephone Encounter (Signed)
Pt c/o increased cough which is causing her to be nauseated. Pt reports having severe coughing spells which results in vomiting d/t the increased nausea. Pt states that she has been using Flutter Valve as directed. Taking all meds as directed. Pt states that she returns to work next week and is very active in her job. Needing something to help with her cough suppression. Has tried OTC Delsym - not working 100%, has given some relief. Requesting something be called into pharmacy.   Patient Instructions 08/28/14     Increase the cough medication to whatever dose you need to stop coughing and let me know if you need more   Coughing into the flutter valve will prevent you from traumatizing your airway with the cough which makes you cough even worse  Pantoprazole (protonix) 40 mg Take 30-60 min before first meal of the day and Pepcid (famotidine) 20 mg one @ bedtime until No cough at all  Please remember to go to the x-ray department downstairs for your tests - we will call you with the results when they are available.  Only use the dulera if you can't catch your breath once you stop the cough first   GERD (REFLUX) is an extremely common cause of respiratory symptoms just like yours , many times with no obvious heartburn at all.   It can be treated with medication, but also with lifestyle changes including elevation of the head of your bed (ideally with 6 inch bed blocks), Smoking cessation, avoidance of late meals, excessive alcohol, and avoid fatty foods, chocolate, peppermint, colas, red wine, and acidic juices such as orange juice.  NO MINT OR MENTHOL PRODUCTS SO NO COUGH DROPS  USE SUGARLESS CANDY INSTEAD (Jolley ranchers or Stover's or Life Savers) or even ice chips will also do - the key is to swallow to prevent all throat clearing. NO OIL BASED VITAMINS - use powdered substitutes.  Return if not 100% better in 2 weeks please return with all meds in hand    Please advise Dr  Kendrick Fries. Thanks.

## 2014-08-31 NOTE — Telephone Encounter (Signed)
Tessalon perles sent to pharmacy  Pt aware.  Letter faxed to fax # [phone] per pt request.  Nothing further needed.

## 2014-08-31 NOTE — Telephone Encounter (Signed)
Fine by me 

## 2014-08-31 NOTE — Telephone Encounter (Signed)
Tessalon  po tid prn cough

## 2014-08-31 NOTE — Telephone Encounter (Signed)
Dr Kendrick Fries please advise on message below as well regarding cough suppression. Thanks.

## 2014-08-31 NOTE — Telephone Encounter (Signed)
There is already a message open on this patient. Will close this message and add to the open encounter.

## 2014-08-31 NOTE — Telephone Encounter (Signed)
Pt calling back about this please advise informed her that you were waitinh to hear form dr who was extstreamlly busy today she can be reache back @ [phone].Caren Griffins

## 2014-09-02 ENCOUNTER — Encounter: Payer: Self-pay | Admitting: Internal Medicine

## 2014-09-02 DIAGNOSIS — J45991 Cough variant asthma: Secondary | ICD-10-CM | POA: Insufficient documentation

## 2014-09-02 NOTE — Assessment & Plan Note (Signed)
The most common causes of chronic cough in immunocompetent adults include the following: upper airway cough syndrome (UACS), previously referred to as postnasal drip syndrome (PNDS), which is caused by variety of rhinosinus conditions; (2) asthma; (3) GERD; (4) chronic bronchitis from cigarette smoking or other inhaled environmental irritants; (5) nonasthmatic eosinophilic bronchitis; and (6) bronchiectasis.   These conditions, singly or in combination, have accounted for up to 94% of the causes of chronic cough in prospective studies.   Other conditions have constituted no >6% of the causes in prospective studies These have included bronchogenic carcinoma, chronic interstitial pneumonia, sarcoidosis, left ventricular failure, ACEI-induced cough, and aspiration from a condition associated with pharyngeal dysfunction.    Chronic cough is often simultaneously caused by more than one condition. A single cause has been found from 38 to 82% of the time, multiple causes from 18 to 62%. Multiply caused cough has been the result of three diseases up to 42% of the time.       Based on hx and exam, this is most likely:  Classic Upper airway cough syndrome, so named because it's frequently impossible to sort out how much is  CR/sinusitis with freq throat clearing (which can be related to primary GERD)   vs  causing  secondary (" extra esophageal")  GERD from wide swings in gastric pressure that occur with throat clearing, often  promoting self use of mint and menthol lozenges that reduce the lower esophageal sphincter tone and exacerbate the problem further in a cyclical fashion.   These are the same pts (now being labeled as having "irritable larynx syndrome" by some cough centers) who not infrequently have a history of having failed to tolerate ace inhibitors,  dry powder inhalers or biphosphonates or report having atypical reflux symptoms that don't respond to standard doses of PPI , and are easily confused as  having aecopd or asthma flares by even experienced allergists/ pulmonologists.   The first step is to maximize acid suppression and eliminate cyclical coughing then regroup if the cough persists.  I had an extended discussion with the patient reviewing all relevant studies completed to date and  lasting 25 m of 40 min ov  1) Explained: The standardized cough guidelines published in Chest by Richard Irwin in 2006 are still the best available and consist of a multiple step process (up to 12!) , not a single office visit,  and are intended  to address this problem logically,  with an alogrithm dependent on response to empiric treatment at  each progressive step  to determine a specific diagnosis with  minimal addtional testing needed. Therefore if adherence is an issue or can't be accurately verified,  it's very unlikely the standard evaluation and treatment will be successful here.    Furthermore, response to therapy (other than acute cough suppression, which should only be used short term with avoidance of narcotic containing cough syrups if possible), can be a gradual process for which the patient may perceive immediate benefit.  Unlike going to an eye doctor where the best perscription is almost always the first one and is immediately effective, this is almost never the case in the management of chronic cough syndromes. Therefore the patient needs to commit up front to consistently adhere to recommendations  for up to 6 weeks of therapy directed at the likely underlying problem(s) before the response can be reasonably evaluated.     2) Each maintenance medication was reviewed in detail including most importantly the difference between maintenance and   prns and under what circumstances the prns are to be triggered using an action plan format that is not reflected in the computer generated alphabetically organized AVS.    Please see instructions for details which were reviewed in writing and the  patient given a copy highlighting the part that I personally wrote and discussed at today's ov.   See instructions for specific recommendations which were reviewed directly with the patient who was given a copy with highlighter outlining the key components.   

## 2014-09-02 NOTE — Assessment & Plan Note (Signed)
The fact that she can go months s symptoms then set this off with one exp to fumes is very stronlgy against asthma at all and in favor of uacs  Would rec only use dulera for breathing difficulty / not for coughing - See instructions for specific recommendations which were reviewed directly with the patient who was given a copy with highlighter outlining the key components.   The proper method of use, as well as anticipated side effects, of a metered-dose inhaler are discussed and demonstrated to the patient. Improved effectiveness after extensive coaching during this visit to a level of approximately  75%

## 2014-09-02 NOTE — Assessment & Plan Note (Signed)
Body mass index is 43.08 kg/(m^2).  No results found for: TSH   Contributing to gerd tendency/ doe/reviewed need  achieve and maintain neg calorie balance > defer f/u primary care including intermittently monitoring thyroid status

## 2014-09-03 ENCOUNTER — Emergency Department (HOSPITAL_BASED_OUTPATIENT_CLINIC_OR_DEPARTMENT_OTHER): Payer: Commercial Managed Care - HMO

## 2014-09-03 ENCOUNTER — Inpatient Hospital Stay (HOSPITAL_BASED_OUTPATIENT_CLINIC_OR_DEPARTMENT_OTHER): Payer: Commercial Managed Care - HMO

## 2014-09-03 ENCOUNTER — Encounter (HOSPITAL_BASED_OUTPATIENT_CLINIC_OR_DEPARTMENT_OTHER): Payer: Self-pay

## 2014-09-03 ENCOUNTER — Inpatient Hospital Stay (HOSPITAL_BASED_OUTPATIENT_CLINIC_OR_DEPARTMENT_OTHER)
Admission: EM | Admit: 2014-09-03 | Discharge: 2014-09-18 | DRG: 286 | Disposition: A | Discharge status: Home Health | Payer: Commercial Managed Care - HMO | Attending: Internal Medicine | Admitting: Internal Medicine

## 2014-09-03 DIAGNOSIS — I5032 Chronic diastolic (congestive) heart failure: Secondary | ICD-10-CM | POA: Diagnosis present

## 2014-09-03 DIAGNOSIS — N3000 Acute cystitis without hematuria: Secondary | ICD-10-CM | POA: Diagnosis not present

## 2014-09-03 DIAGNOSIS — I272 Other secondary pulmonary hypertension: Secondary | ICD-10-CM | POA: Diagnosis present

## 2014-09-03 DIAGNOSIS — I083 Combined rheumatic disorders of mitral, aortic and tricuspid valves: Principal | ICD-10-CM | POA: Diagnosis present

## 2014-09-03 DIAGNOSIS — I342 Nonrheumatic mitral (valve) stenosis: Secondary | ICD-10-CM | POA: Diagnosis not present

## 2014-09-03 DIAGNOSIS — R600 Localized edema: Secondary | ICD-10-CM

## 2014-09-03 DIAGNOSIS — R0600 Dyspnea, unspecified: Secondary | ICD-10-CM | POA: Diagnosis not present

## 2014-09-03 DIAGNOSIS — I951 Orthostatic hypotension: Secondary | ICD-10-CM | POA: Diagnosis not present

## 2014-09-03 DIAGNOSIS — I060 Rheumatic aortic stenosis: Secondary | ICD-10-CM | POA: Diagnosis present

## 2014-09-03 DIAGNOSIS — R05 Cough: Secondary | ICD-10-CM | POA: Diagnosis present

## 2014-09-03 DIAGNOSIS — E875 Hyperkalemia: Secondary | ICD-10-CM | POA: Diagnosis not present

## 2014-09-03 DIAGNOSIS — T508X5A Adverse effect of diagnostic agents, initial encounter: Secondary | ICD-10-CM | POA: Diagnosis not present

## 2014-09-03 DIAGNOSIS — I050 Rheumatic mitral stenosis: Secondary | ICD-10-CM

## 2014-09-03 DIAGNOSIS — I350 Nonrheumatic aortic (valve) stenosis: Secondary | ICD-10-CM | POA: Diagnosis not present

## 2014-09-03 DIAGNOSIS — I4891 Unspecified atrial fibrillation: Secondary | ICD-10-CM | POA: Diagnosis not present

## 2014-09-03 DIAGNOSIS — Z6841 Body Mass Index (BMI) 40.0 and over, adult: Secondary | ICD-10-CM | POA: Diagnosis not present

## 2014-09-03 DIAGNOSIS — R197 Diarrhea, unspecified: Secondary | ICD-10-CM | POA: Diagnosis present

## 2014-09-03 DIAGNOSIS — J45909 Unspecified asthma, uncomplicated: Secondary | ICD-10-CM | POA: Diagnosis present

## 2014-09-03 DIAGNOSIS — E46 Unspecified protein-calorie malnutrition: Secondary | ICD-10-CM | POA: Diagnosis present

## 2014-09-03 DIAGNOSIS — J9601 Acute respiratory failure with hypoxia: Secondary | ICD-10-CM | POA: Diagnosis present

## 2014-09-03 DIAGNOSIS — I739 Peripheral vascular disease, unspecified: Secondary | ICD-10-CM

## 2014-09-03 DIAGNOSIS — I270 Primary pulmonary hypertension: Secondary | ICD-10-CM | POA: Diagnosis not present

## 2014-09-03 DIAGNOSIS — N141 Nephropathy induced by other drugs, medicaments and biological substances: Secondary | ICD-10-CM | POA: Diagnosis not present

## 2014-09-03 DIAGNOSIS — Z01810 Encounter for preprocedural cardiovascular examination: Secondary | ICD-10-CM | POA: Diagnosis not present

## 2014-09-03 DIAGNOSIS — E871 Hypo-osmolality and hyponatremia: Secondary | ICD-10-CM | POA: Diagnosis not present

## 2014-09-03 DIAGNOSIS — E780 Pure hypercholesterolemia: Secondary | ICD-10-CM | POA: Diagnosis present

## 2014-09-03 DIAGNOSIS — Z823 Family history of stroke: Secondary | ICD-10-CM | POA: Diagnosis not present

## 2014-09-03 DIAGNOSIS — I129 Hypertensive chronic kidney disease with stage 1 through stage 4 chronic kidney disease, or unspecified chronic kidney disease: Secondary | ICD-10-CM | POA: Diagnosis present

## 2014-09-03 DIAGNOSIS — N289 Disorder of kidney and ureter, unspecified: Secondary | ICD-10-CM | POA: Diagnosis not present

## 2014-09-03 DIAGNOSIS — R112 Nausea with vomiting, unspecified: Secondary | ICD-10-CM | POA: Diagnosis present

## 2014-09-03 DIAGNOSIS — I080 Rheumatic disorders of both mitral and aortic valves: Secondary | ICD-10-CM | POA: Diagnosis not present

## 2014-09-03 DIAGNOSIS — D72829 Elevated white blood cell count, unspecified: Secondary | ICD-10-CM

## 2014-09-03 DIAGNOSIS — N179 Acute kidney failure, unspecified: Secondary | ICD-10-CM | POA: Diagnosis present

## 2014-09-03 DIAGNOSIS — D62 Acute posthemorrhagic anemia: Secondary | ICD-10-CM | POA: Diagnosis not present

## 2014-09-03 DIAGNOSIS — I213 ST elevation (STEMI) myocardial infarction of unspecified site: Secondary | ICD-10-CM | POA: Diagnosis not present

## 2014-09-03 DIAGNOSIS — N280 Ischemia and infarction of kidney: Secondary | ICD-10-CM | POA: Diagnosis not present

## 2014-09-03 DIAGNOSIS — N3001 Acute cystitis with hematuria: Secondary | ICD-10-CM | POA: Diagnosis not present

## 2014-09-03 DIAGNOSIS — I481 Persistent atrial fibrillation: Secondary | ICD-10-CM | POA: Diagnosis present

## 2014-09-03 DIAGNOSIS — N184 Chronic kidney disease, stage 4 (severe): Secondary | ICD-10-CM | POA: Diagnosis present

## 2014-09-03 DIAGNOSIS — I5033 Acute on chronic diastolic (congestive) heart failure: Secondary | ICD-10-CM | POA: Diagnosis present

## 2014-09-03 DIAGNOSIS — I240 Acute coronary thrombosis not resulting in myocardial infarction: Secondary | ICD-10-CM | POA: Diagnosis present

## 2014-09-03 DIAGNOSIS — I513 Intracardiac thrombosis, not elsewhere classified: Secondary | ICD-10-CM | POA: Diagnosis present

## 2014-09-03 DIAGNOSIS — K5900 Constipation, unspecified: Secondary | ICD-10-CM | POA: Diagnosis not present

## 2014-09-03 DIAGNOSIS — I5031 Acute diastolic (congestive) heart failure: Secondary | ICD-10-CM | POA: Diagnosis not present

## 2014-09-03 DIAGNOSIS — R0602 Shortness of breath: Secondary | ICD-10-CM

## 2014-09-03 DIAGNOSIS — I639 Cerebral infarction, unspecified: Secondary | ICD-10-CM

## 2014-09-03 HISTORY — DX: Unspecified atrial fibrillation: I48.91

## 2014-09-03 HISTORY — DX: Ischemia and infarction of kidney: N28.0

## 2014-09-03 HISTORY — DX: Intracardiac thrombosis, not elsewhere classified: I51.3

## 2014-09-03 HISTORY — DX: Morbid (severe) obesity due to excess calories: E66.01

## 2014-09-03 HISTORY — DX: Rheumatic mitral stenosis: I05.0

## 2014-09-03 HISTORY — DX: Bifascicular block: I45.2

## 2014-09-03 HISTORY — DX: Pure hypercholesterolemia, unspecified: E78.00

## 2014-09-03 HISTORY — DX: Chronic diastolic (congestive) heart failure: I50.32

## 2014-09-03 HISTORY — DX: Rheumatic aortic stenosis: I06.0

## 2014-09-03 LAB — COMPREHENSIVE METABOLIC PANEL
ALT: 69 U/L — ABNORMAL HIGH (ref 14–54)
AST: 60 U/L — AB (ref 15–41)
Albumin: 3.1 g/dL — ABNORMAL LOW (ref 3.5–5.0)
Alkaline Phosphatase: 143 U/L — ABNORMAL HIGH (ref 38–126)
Anion gap: 11 (ref 5–15)
BUN: 21 mg/dL — AB (ref 6–20)
CHLORIDE: 104 mmol/L (ref 101–111)
CO2: 22 mmol/L (ref 22–32)
Calcium: 8.9 mg/dL (ref 8.9–10.3)
Creatinine, Ser: 1.22 mg/dL — ABNORMAL HIGH (ref 0.44–1.00)
GFR, EST AFRICAN AMERICAN: 55 mL/min — AB (ref >60)
GFR, EST NON AFRICAN AMERICAN: 48 mL/min — AB (ref >60)
Glucose, Bld: 158 mg/dL — ABNORMAL HIGH (ref 65–99)
Potassium: 5.3 mmol/L — ABNORMAL HIGH (ref 3.5–5.1)
Sodium: 137 mmol/L (ref 135–145)
Total Bilirubin: 1 mg/dL (ref 0.3–1.2)
Total Protein: 6.3 g/dL — ABNORMAL LOW (ref 6.5–8.1)

## 2014-09-03 LAB — MAGNESIUM: MAGNESIUM: 2.1 mg/dL (ref 1.7–2.4)

## 2014-09-03 LAB — URINALYSIS, ROUTINE W REFLEX MICROSCOPIC
Glucose, UA: NEGATIVE mg/dL
Hgb urine dipstick: NEGATIVE
Ketones, ur: NEGATIVE mg/dL
NITRITE: NEGATIVE
PH: 5.5 (ref 5.0–8.0)
Protein, ur: 100 mg/dL — AB
Specific Gravity, Urine: 1.026 (ref 1.005–1.030)
UROBILINOGEN UA: 1 mg/dL (ref 0.0–1.0)

## 2014-09-03 LAB — BASIC METABOLIC PANEL
ANION GAP: 10 (ref 5–15)
BUN: 22 mg/dL — ABNORMAL HIGH (ref 6–20)
CO2: 22 mmol/L (ref 22–32)
CREATININE: 1.25 mg/dL — AB (ref 0.44–1.00)
Calcium: 8.9 mg/dL (ref 8.9–10.3)
Chloride: 105 mmol/L (ref 101–111)
GFR calc non Af Amer: 46 mL/min — ABNORMAL LOW (ref >60)
GFR, EST AFRICAN AMERICAN: 54 mL/min — AB (ref >60)
Glucose, Bld: 173 mg/dL — ABNORMAL HIGH (ref 65–99)
Potassium: 5.6 mmol/L — ABNORMAL HIGH (ref 3.5–5.1)
SODIUM: 137 mmol/L (ref 135–145)

## 2014-09-03 LAB — CBC WITH DIFFERENTIAL/PLATELET
BASOS ABS: 0 10*3/uL (ref 0.0–0.1)
BASOS PCT: 0 % (ref 0–1)
Eosinophils Absolute: 0 10*3/uL (ref 0.0–0.7)
Eosinophils Relative: 0 % (ref 0–5)
HEMATOCRIT: 40.1 % (ref 36.0–46.0)
Hemoglobin: 13.4 g/dL (ref 12.0–15.0)
LYMPHS PCT: 9 % — AB (ref 12–46)
Lymphs Abs: 1 10*3/uL (ref 0.7–4.0)
MCH: 29.5 pg (ref 26.0–34.0)
MCHC: 33.4 g/dL (ref 30.0–36.0)
MCV: 88.1 fL (ref 78.0–100.0)
Monocytes Absolute: 0.6 10*3/uL (ref 0.1–1.0)
Monocytes Relative: 6 % (ref 3–12)
NEUTROS ABS: 9.1 10*3/uL — AB (ref 1.7–7.7)
Neutrophils Relative %: 85 % — ABNORMAL HIGH (ref 43–77)
Platelets: 219 10*3/uL (ref 150–400)
RBC: 4.55 MIL/uL (ref 3.87–5.11)
RDW: 14.9 % (ref 11.5–15.5)
WBC: 10.7 10*3/uL — ABNORMAL HIGH (ref 4.0–10.5)

## 2014-09-03 LAB — URINE MICROSCOPIC-ADD ON

## 2014-09-03 LAB — LIPASE, BLOOD: LIPASE: 20 U/L — AB (ref 22–51)

## 2014-09-03 LAB — MRSA PCR SCREENING: MRSA BY PCR: NEGATIVE

## 2014-09-03 LAB — I-STAT CG4 LACTIC ACID, ED: LACTIC ACID, VENOUS: 2.62 mmol/L — AB (ref 0.5–2.0)

## 2014-09-03 LAB — PROTIME-INR
INR: 1.33 (ref 0.00–1.49)
Prothrombin Time: 16.6 seconds — ABNORMAL HIGH (ref 11.6–15.2)

## 2014-09-03 LAB — TSH: TSH: 2.528 u[IU]/mL (ref 0.350–4.500)

## 2014-09-03 LAB — APTT: APTT: 25 s (ref 24–37)

## 2014-09-03 MED ORDER — FENTANYL CITRATE (PF) 100 MCG/2ML IJ SOLN
50.0000 ug | Freq: Once | INTRAMUSCULAR | Status: AC
  Administered 2014-09-03: 50 ug via INTRAVENOUS
  Filled 2014-09-03: qty 2

## 2014-09-03 MED ORDER — HEPARIN BOLUS VIA INFUSION
3500.0000 [IU] | Freq: Once | INTRAVENOUS | Status: AC
  Administered 2014-09-03: 3500 [IU] via INTRAVENOUS
  Filled 2014-09-03: qty 3500

## 2014-09-03 MED ORDER — PANTOPRAZOLE SODIUM 40 MG PO TBEC
40.0000 mg | DELAYED_RELEASE_TABLET | Freq: Every day | ORAL | Status: DC
  Administered 2014-09-04 – 2014-09-18 (×15): 40 mg via ORAL
  Filled 2014-09-03 (×15): qty 1

## 2014-09-03 MED ORDER — ONDANSETRON HCL 4 MG/2ML IJ SOLN
4.0000 mg | Freq: Once | INTRAMUSCULAR | Status: AC
  Administered 2014-09-03: 4 mg via INTRAVENOUS
  Filled 2014-09-03: qty 2

## 2014-09-03 MED ORDER — DEXTROSE 5 % IV SOLN
1.0000 g | INTRAVENOUS | Status: DC
  Administered 2014-09-03 – 2014-09-06 (×4): 1 g via INTRAVENOUS
  Filled 2014-09-03 (×4): qty 10

## 2014-09-03 MED ORDER — HEPARIN (PORCINE) IN NACL 100-0.45 UNIT/ML-% IJ SOLN
2250.0000 [IU]/h | INTRAMUSCULAR | Status: DC
  Administered 2014-09-03: 1000 [IU]/h via INTRAVENOUS
  Administered 2014-09-04: 1300 [IU]/h via INTRAVENOUS
  Administered 2014-09-05: 1500 [IU]/h via INTRAVENOUS
  Administered 2014-09-06: 1750 [IU]/h via INTRAVENOUS
  Administered 2014-09-08: 2050 [IU]/h via INTRAVENOUS
  Administered 2014-09-09 – 2014-09-12 (×5): 2250 [IU]/h via INTRAVENOUS
  Filled 2014-09-03 (×16): qty 250

## 2014-09-03 MED ORDER — SODIUM CHLORIDE 0.9 % IJ SOLN
3.0000 mL | Freq: Two times a day (BID) | INTRAMUSCULAR | Status: DC
  Administered 2014-09-03 – 2014-09-15 (×8): 3 mL via INTRAVENOUS
  Administered 2014-09-15 – 2014-09-16 (×2): 10 mL via INTRAVENOUS
  Administered 2014-09-16 – 2014-09-17 (×3): 3 mL via INTRAVENOUS

## 2014-09-03 MED ORDER — SODIUM CHLORIDE 0.9 % IV BOLUS (SEPSIS)
1000.0000 mL | Freq: Once | INTRAVENOUS | Status: AC
Start: 2014-09-03 — End: 2014-09-03
  Administered 2014-09-03: 1000 mL via INTRAVENOUS

## 2014-09-03 MED ORDER — MORPHINE SULFATE (PF) 4 MG/ML IV SOLN
INTRAVENOUS | Status: AC
  Filled 2014-09-03: qty 1

## 2014-09-03 MED ORDER — BENZONATATE 100 MG PO CAPS
200.0000 mg | ORAL_CAPSULE | Freq: Three times a day (TID) | ORAL | Status: DC | PRN
  Administered 2014-09-04 – 2014-09-05 (×2): 200 mg via ORAL
  Filled 2014-09-03 (×2): qty 2

## 2014-09-03 MED ORDER — PRAVASTATIN SODIUM 40 MG PO TABS
40.0000 mg | ORAL_TABLET | Freq: Every day | ORAL | Status: DC
  Administered 2014-09-04 – 2014-09-17 (×14): 40 mg via ORAL
  Filled 2014-09-03 (×14): qty 1

## 2014-09-03 MED ORDER — FAMOTIDINE 20 MG PO TABS
20.0000 mg | ORAL_TABLET | Freq: Every day | ORAL | Status: DC
  Administered 2014-09-03: 20 mg via ORAL
  Filled 2014-09-03: qty 1

## 2014-09-03 MED ORDER — MORPHINE SULFATE (PF) 2 MG/ML IV SOLN
2.0000 mg | INTRAVENOUS | Status: DC | PRN
  Administered 2014-09-03: 4 mg via INTRAVENOUS
  Administered 2014-09-04 – 2014-09-05 (×4): 2 mg via INTRAVENOUS
  Filled 2014-09-03 (×4): qty 1

## 2014-09-03 MED ORDER — MOMETASONE FURO-FORMOTEROL FUM 100-5 MCG/ACT IN AERO
2.0000 | INHALATION_SPRAY | Freq: Two times a day (BID) | RESPIRATORY_TRACT | Status: DC
  Administered 2014-09-04 – 2014-09-18 (×24): 2 via RESPIRATORY_TRACT
  Filled 2014-09-03 (×2): qty 8.8

## 2014-09-03 MED ORDER — DILTIAZEM HCL 100 MG IV SOLR
5.0000 mg/h | INTRAVENOUS | Status: DC
  Administered 2014-09-03: 5 mg/h via INTRAVENOUS
  Administered 2014-09-04 – 2014-09-06 (×6): 15 mg/h via INTRAVENOUS
  Filled 2014-09-03 (×8): qty 100

## 2014-09-03 MED ORDER — SODIUM CHLORIDE 0.9 % IV SOLN
INTRAVENOUS | Status: DC
  Administered 2014-09-03 – 2014-09-04 (×4): via INTRAVENOUS

## 2014-09-03 MED ORDER — DEXTROSE 5 % IV SOLN: 1.0000 g | INTRAVENOUS | Status: DC

## 2014-09-03 MED ORDER — DILTIAZEM LOAD VIA INFUSION
10.0000 mg | Freq: Once | INTRAVENOUS | Status: AC
  Administered 2014-09-03: 10 mg via INTRAVENOUS
  Filled 2014-09-03: qty 10

## 2014-09-03 MED ORDER — SODIUM CHLORIDE 0.9 % IV SOLN
1.0000 g | Freq: Once | INTRAVENOUS | Status: AC
  Administered 2014-09-03: 1 g via INTRAVENOUS
  Filled 2014-09-03: qty 10

## 2014-09-03 MED ORDER — SODIUM CHLORIDE 0.9 % IV BOLUS (SEPSIS)
500.0000 mL | Freq: Once | INTRAVENOUS | Status: AC
  Administered 2014-09-03: 500 mL via INTRAVENOUS

## 2014-09-03 MED ORDER — IOHEXOL 350 MG/ML SOLN
100.0000 mL | Freq: Once | INTRAVENOUS | Status: AC | PRN
  Administered 2014-09-03: 100 mL via INTRAVENOUS

## 2014-09-03 NOTE — ED Notes (Signed)
C/o slightly prod cough, n/v/d x 1-2 weeks-has been seen x 2-rx for bronchitis

## 2014-09-03 NOTE — ED Notes (Signed)
LAC drawn by RN. Results of 2.62 hand delivered to Dr. Rubin Payor.

## 2014-09-03 NOTE — ED Provider Notes (Addendum)
CSN: [id number]     Arrival date & time 09/03/14  1518 History  This chart was scribed for [name] Core, MD by Budd Palmer, ED Scribe. This patient was seen in room MH05/MH05 and the patient's care was started at 3:35 PM.    Chief Complaint  Patient presents with  . Cough   The history is provided by the patient and a relative. No language interpreter was used.   HPI Comments: [name] [name] is a 59 y.o. female with a PMHx of asthma who presents to the Emergency Department complaining of an unchanged, productive (yellow) cough onset 1.5 weeks ago. She reports associated n/v/d, abdominal pain, back pain, chest pain, and SOB. Per daughter she coughs to the point of gagging. She has not been able to eat anything but light, liquid foods.  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 She has been seen for this and was originally diagnosed with dust-induced bronchitis. She has seen Dr Doristine Counter and Dr Wyn Quaker 1 week ago, each of which gave her medication for bronchitis and respiratory infection. Pt denies HA, rhinorrhea, and congestion.   Past Medical History  Diagnosis Date  . Asthma   . Obesity   . Hypercholesteremia   . Heart murmur    Past Surgical History  Procedure Laterality Date  . Abdominal hysterectomy    . Knee arthroscopy     Family History  Problem Relation Age of Onset  . Stroke Mother   . Prostate cancer Father    Social History  Substance Use Topics  . Smoking status: Never Smoker   . Smokeless tobacco: Never Used  . Alcohol Use: No   OB History    No data available     Review of Systems  HENT: Negative for congestion and rhinorrhea.   Respiratory: Positive for cough and shortness of breath.   Cardiovascular: Positive for chest pain.  Gastrointestinal: Positive for nausea, vomiting, abdominal pain and diarrhea.  Musculoskeletal: Positive for back pain.  Neurological: Negative for  headaches.  All other systems reviewed and are negative.   Allergies  Codeine  Home Medications   Prior to Admission medications   Medication Sig Start Date End Date Taking? Authorizing Provider  b complex vitamins capsule Take 1 capsule by mouth daily.    Historical Provider, MD  benzonatate (TESSALON) 200 MG capsule Take 1 capsule (200 mg total) by mouth 3 (three) times daily as needed for cough. 08/31/14   Lupita Leash, MD  cholecalciferol (VITAMIN D) 1000 UNITS tablet Take 1,000 Units by mouth daily. 2 tabs daily = 2,000    Historical Provider, MD  Coenzyme Q10 (COQ-10) 10 MG CAPS Take 1 capsule by mouth daily.    Historical Provider, MD  docusate sodium (COLACE) 100 MG capsule Take 1 capsule (100 mg total) by mouth every 12 (twelve) hours. Patient not taking: Reported on 08/28/2014 05/20/14   Oswaldo Conroy, PA-C  famotidine (PEPCID) 20 MG tablet One at bedtime 08/28/14   Nyoka Cowden, MD  mometasone-formoterol [facility]) 100-5 MCG/ACT AERO Inhale 2 puffs into the lungs as needed for wheezing.    Historical Provider, MD  OMEGA-3 FATTY ACIDS PO Take 1,600 mg by mouth daily.    Historical Provider, MD  ondansetron (ZOFRAN) 4 MG tablet Take 1 tablet (4 mg total) by mouth every 8 (eight) hours as needed for nausea or vomiting. Patient not taking: Reported on 08/28/2014 05/20/14  Oswaldo Conroy, PA-C  pantoprazole (PROTONIX) 40 MG tablet Take 1 tablet (40 mg total) by mouth daily. Take 30-60 min before first meal of the day 08/28/14   Nyoka Cowden, MD  pravastatin (PRAVACHOL) 40 MG tablet Take 40 mg by mouth daily.  09/11/13   Historical Provider, MD  ranitidine (ZANTAC) 150 MG tablet Take 1 tablet (150 mg total) by mouth 2 (two) times daily. Patient not taking: Reported on 08/28/2014 05/20/14   Oswaldo Conroy, PA-C  Respiratory Therapy Supplies (FLUTTER) DEVI Use as directed 08/28/14   Nyoka Cowden, MD   BP 121/101 mmHg  Pulse 132  Temp(Src) 96 F (35.6 C) (Oral)  Resp 28  Ht   (1.499 m)  Wt 235 lb (106.595 kg)  BMI 47.44 kg/m2  SpO2 100% Physical Exam  Constitutional: She appears well-developed and well-nourished.  Looks uncomfortable  HENT:  Head: Normocephalic and atraumatic.  mouth somewhat dry  Eyes: Conjunctivae are normal. Right eye exhibits no discharge. Left eye exhibits no discharge.  Cardiovascular: Normal rate and regular rhythm.   Good capillary refill   Pulmonary/Chest: Effort normal. No respiratory distress. She has wheezes.  Slight expiratory wheezes  Abdominal: Soft. There is tenderness.  Minimal diffuse abdominal TTP  Neurological: She is alert. Coordination normal.  Skin: Skin is warm and dry. No rash noted. She is not diaphoretic. No erythema.  Psychiatric: She has a normal mood and affect.  Nursing note and vitals reviewed.   ED Course  Procedures  DIAGNOSTIC STUDIES: Oxygen Saturation is 99% on RA, normal by my interpretation.    COORDINATION OF CARE: 3:41 PM - Discussed possible viral infection. Discussed plans to order diagnostic studies, IV fluids, and anti-nausea medication. Pt advised of plan for treatment and pt agrees.  Labs Review Labs Reviewed  COMPREHENSIVE METABOLIC PANEL - Abnormal; Notable for the following:    Potassium 5.3 (*)    Glucose, Bld 158 (*)    BUN 21 (*)    Creatinine, Ser 1.22 (*)    Total Protein 6.3 (*)    Albumin 3.1 (*)    AST 60 (*)    ALT 69 (*)    Alkaline Phosphatase 143 (*)    GFR calc non Af Amer 48 (*)    GFR calc Af Amer 55 (*)    All other components within normal limits  LIPASE, BLOOD - Abnormal; Notable for the following:    Lipase 20 (*)    All other components within normal limits  CBC WITH DIFFERENTIAL/PLATELET - Abnormal; Notable for the following:    WBC 10.7 (*)    Neutrophils Relative % 85 (*)    Neutro Abs 9.1 (*)    Lymphocytes Relative 9 (*)    All other components within normal limits  URINALYSIS, ROUTINE W REFLEX MICROSCOPIC (NOT AT [facility]) - Abnormal; Notable for  the following:    Color, Urine ORANGE (*)    APPearance CLOUDY (*)    Bilirubin Urine SMALL (*)    Protein, ur 100 (*)    Leukocytes, UA LARGE (*)    All other components within normal limits  BASIC METABOLIC PANEL - Abnormal; Notable for the following:    Potassium 5.6 (*)    Glucose, Bld 173 (*)    BUN 22 (*)    Creatinine, Ser 1.25 (*)    GFR calc non Af Amer 46 (*)    GFR calc Af Amer 54 (*)    All other components within normal limits  PROTIME-INR -  Abnormal; Notable for the following:    Prothrombin Time 16.6 (*)    All other components within normal limits  URINE MICROSCOPIC-ADD ON - Abnormal; Notable for the following:    Squamous Epithelial / LPF FEW (*)    Bacteria, UA FEW (*)    Casts HYALINE CASTS (*)    All other components within normal limits  I-STAT CG4 LACTIC ACID, ED - Abnormal; Notable for the following:    Lactic Acid, Venous 2.62 (*)    All other components within normal limits  URINE CULTURE  MAGNESIUM  APTT  TSH    Imaging Review Dg Abd Acute W/chest  09/03/2014   CLINICAL DATA:  Congestion for 2 weeks. History of heart murmur. Vomiting.  EXAM: DG ABDOMEN ACUTE W/ 1V CHEST  COMPARISON:  08/30/2014 and 05/19/2014  FINDINGS: Interstitial markings are mildly prominent but similar to the previous examination. Heart size is slightly prominent but stable. The trachea is midline. There is no evidence to suggest free air. Gas in the stomach. Small amount of gas and stool in the colon. Nonobstructive bowel gas pattern. Mild degenerative changes at the pubic symphysis.  IMPRESSION: Slightly prominent lung markings which are similar to the previous examinations. Difficult to exclude mild interstitial edema or vascular congestion.  Gaseous distention of the stomach.   Electronically Signed   By: Richarda Overlie M.D.   On: 09/03/2014 18:51   I have personally reviewed and evaluated these images and lab results as part of my medical decision-making.   EKG  Interpretation   Date/Time:  Monday September 03 2014 18:17:46 EDT Ventricular Rate:  142 PR Interval:    QRS Duration: 112 QT Interval:  336 QTC Calculation: 516 R Axis:   139 Text Interpretation:  Atrial fibrillation with rapid ventricular response  Right bundle branch block Left posterior fascicular block ** Bifascicular  block ** Abnormal ECG Confirmed by Rubin Payor  MD, Harrold Donath [phone]) on  09/03/2014 6:31:01 PM      MDM   Final diagnoses:  Renal insufficiency  Hyperkalemia  Atrial fibrillation with rapid ventricular response  Nausea, vomiting and diarrhea  Acute cystitis without hematuria    Patient presents with multiple complaints. Has had nausea vomiting diarrhea and cough. She's felt weak. Has been seen by PCP and pulmonology. Found to have renal insufficiency. Potassium is mildly elevated but new onset arrhythmia of atrial fibrillation with RVR. Started on calcium and Cardizem. Given IV fluid boluses. Had diffuse abdominal pain and with the new A. fib lactic acid was done and was mildly elevated. CT angiography done to rule out mesenteric ischemia. Also found have urinary tract infection. Doubt patient is severely septic at this time. We'll transfer to stepdown bed at [facility] Discussed with Dr. Robb Matar. As the time of transfer antibiotics had not yet been given for UTI since this would've delayed the transfer   I was called with the report of right atrial clot and infarcted left kidney. Has not been heparinized yet. Patient is in transport to [facility] stepdown bed right now. Discussed with Dr. Robb Matar, who was the accepting physician, however he may not be the admitting physician.   I personally performed the services described in this documentation, which was scribed in my presence. The recorded information has been reviewed and is accurate.   CRITICAL CARE Performed by: Billee Cashing Total critical care time: 40 Critical care time was exclusive of separately  billable procedures and treating other patients. Critical care was necessary to treat or prevent  imminent or life-threatening deterioration. Critical care was time spent personally by me on the following activities: development of treatment plan with patient and/or surrogate as well as nursing, discussions with consultants, evaluation of patient's response to treatment, examination of patient, obtaining history from patient or surrogate, ordering and performing treatments and interventions, ordering and review of laboratory studies, ordering and review of radiographic studies, pulse oximetry and re-evaluation of patient's condition.  [name] Core, MD 09/03/14 2033  [name] Core, MD 09/03/14 2056

## 2014-09-03 NOTE — ED Notes (Signed)
Attempt to call report to [address] west nurse not available 

## 2014-09-03 NOTE — H&P (Signed)
[facility] History and Physical  CHARNEL GILES ZOX:[id number] DOB: Jun 07, 1955 DOA: 09/03/2014  Referring physician: EDP PCP: Delorse Lek, MD   Chief Complaint: Cough   HPI: [name] [name] is a 59 y.o. female with pmh of asthma / pseudoasthma who presents to the ED with c/o unchanged, productive cough onset 1.5 weeks ago.  Has had associated symptoms of severe L sided abdominal, flank and back pain onset between 3 and 4 days ago that has been persistent since then.  Per daughter she coughs to the point of gagging.  Not able to eat anything but light / liquid foods.  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  Has h/o dust-induced bronchitis.  Saw Dr. Sherene Sires a week ago who gave her meds for bronchitis and respiratory infection.  Review of Systems: Systems reviewed.  As above, otherwise negative  Past Medical History  Diagnosis Date  . Asthma   . Obesity   . Hypercholesteremia   . Heart murmur    Past Surgical History  Procedure Laterality Date  . Abdominal hysterectomy    . Knee arthroscopy     Social History:  reports that she has never smoked. She has never used smokeless tobacco. She reports that she does not drink alcohol or use illicit drugs.  Allergies  Allergen Reactions  . Codeine Itching    Family History  Problem Relation Age of Onset  . Stroke Mother   . Prostate cancer Father      Prior to Admission medications   Medication Sig Start Date End Date Taking? Authorizing Provider  b complex vitamins capsule Take 1 capsule by mouth daily.    Historical Provider, MD  benzonatate (TESSALON) 200 MG capsule Take 1 capsule (200 mg total) by mouth 3 (three) times daily as needed for cough. 08/31/14   Lupita Leash, MD  cholecalciferol (VITAMIN D) 1000 UNITS tablet Take 1,000 Units by mouth daily. 2 tabs daily = 2,000    Historical Provider, MD  Coenzyme Q10 (COQ-10) 10 MG CAPS Take 1 capsule by mouth daily.    Historical  Provider, MD  famotidine (PEPCID) 20 MG tablet One at bedtime 08/28/14   Nyoka Cowden, MD  mometasone-formoterol [facility]) 100-5 MCG/ACT AERO Inhale 2 puffs into the lungs as needed for wheezing.    Historical Provider, MD  OMEGA-3 FATTY ACIDS PO Take 1,600 mg by mouth daily.    Historical Provider, MD  pantoprazole (PROTONIX) 40 MG tablet Take 1 tablet (40 mg total) by mouth daily. Take 30-60 min before first meal of the day 08/28/14   Nyoka Cowden, MD  pravastatin (PRAVACHOL) 40 MG tablet Take 40 mg by mouth daily.  09/11/13   Historical Provider, MD  Respiratory Therapy Supplies (FLUTTER) DEVI Use as directed 08/28/14   Nyoka Cowden, MD   Physical Exam: Filed Vitals:   09/03/14 2131  BP: 126/88  Pulse: 108  Temp: 98 F (36.7 C)  Resp: 21    BP 126/88 mmHg  Pulse 108  Temp(Src) 98 F (36.7 C) (Oral)  Resp 21  Ht  (1.499 m)  Wt 109.317 kg (241 lb)  BMI 48.65 kg/m2  SpO2 96%  General Appearance:    Alert, oriented, no distress, appears stated age  Head:    Normocephalic, atraumatic  Eyes:    PERRL, EOMI, sclera non-icteric        Nose:   Nares without drainage or epistaxis. Mucosa, turbinates normal  Throat:  Moist mucous membranes. Oropharynx without erythema or exudate.  Neck:   Supple. No carotid bruits.  No thyromegaly.  No lymphadenopathy.   Back:     No CVA tenderness, no spinal tenderness  Lungs:     Clear to auscultation bilaterally, without wheezes, rhonchi or rales  Chest wall:    No tenderness to palpitation  Heart:    Regular rate and rhythm without murmurs, gallops, rubs  Abdomen:     Soft, non-tender, nondistended, normal bowel sounds, no organomegaly  Genitalia:    deferred  Rectal:    deferred  Extremities:   No clubbing, cyanosis or edema.  Pulses:   2+ and symmetric all extremities  Skin:   Skin color, texture, turgor normal, no rashes or lesions  Lymph nodes:   Cervical, supraclavicular, and axillary nodes normal  Neurologic:   CNII-XII intact.  Normal strength, sensation and reflexes      throughout    Labs on Admission:  Basic Metabolic Panel:  Recent Labs Lab 09/03/14 1600 09/03/14 1740  NA 137 137  K 5.3* 5.6*  CL 104 105  CO2 22 22  GLUCOSE 158* 173*  BUN 21* 22*  CREATININE 1.22* 1.25*  CALCIUM 8.9 8.9  MG 2.1  --    Liver Function Tests:  Recent Labs Lab 09/03/14 1600  AST 60*  ALT 69*  ALKPHOS 143*  BILITOT 1.0  PROT 6.3*  ALBUMIN 3.1*    Recent Labs Lab 09/03/14 1600  LIPASE 20*   No results for input(s): AMMONIA in the last 168 hours. CBC:  Recent Labs Lab 09/03/14 1600  WBC 10.7*  NEUTROABS 9.1*  HGB 13.4  HCT 40.1  MCV 88.1  PLT 219   Cardiac Enzymes: No results for input(s): CKTOTAL, CKMB, CKMBINDEX, TROPONINI in the last 168 hours.  BNP (last 3 results) No results for input(s): PROBNP in the last 8760 hours. CBG: No results for input(s): GLUCAP in the last 168 hours.  Radiological Exams on Admission: Dg Abd Acute W/chest  09/03/2014   CLINICAL DATA:  Congestion for 2 weeks. History of heart murmur. Vomiting.  EXAM: DG ABDOMEN ACUTE W/ 1V CHEST  COMPARISON:  08/30/2014 and 05/19/2014  FINDINGS: Interstitial markings are mildly prominent but similar to the previous examination. Heart size is slightly prominent but stable. The trachea is midline. There is no evidence to suggest free air. Gas in the stomach. Small amount of gas and stool in the colon. Nonobstructive bowel gas pattern. Mild degenerative changes at the pubic symphysis.  IMPRESSION: Slightly prominent lung markings which are similar to the previous examinations. Difficult to exclude mild interstitial edema or vascular congestion.  Gaseous distention of the stomach.   Electronically Signed   By: Richarda Overlie M.D.   On: 09/03/2014 18:51   Ct Cta Abd/pel W/cm &/or W/o Cm  09/03/2014   CLINICAL DATA:  59 year old with left-sided abdominal pain for 2 weeks. New onset of atrial fibrillation.  EXAM: CT ANGIOGRAPHY ABDOMEN AND  PELVIS  TECHNIQUE: Multidetector CT imaging of the abdomen and pelvis was performed using the standard protocol during bolus administration of intravenous contrast. Multiplanar reconstructed images including MIPs were obtained and reviewed to evaluate the vascular anatomy.  CONTRAST:  100 mL Omnipaque 350  COMPARISON:  Abdominal radiographs 09/03/2014  FINDINGS: ARTERIAL FINDINGS:  Aorta/Heart: Large filling defects in the posterior left atrium are most compatible with thrombus. The entire left atrium is not imaged. Right atrium appears to be enlarged. Cannot exclude thrombus in the right pulmonary veins.  The abdominal aorta is patent without aneurysm or dissection.  Celiac axis: Celiac trunk and main branch vessels are patent, including the splenic artery.  Superior mesenteric: The superior mesenteric artery is patent. Main branch vessels are patent.  Left renal: Occlusion of the left renal artery just beyond the origin. Minimal blood flow to the left kidney even on the delayed images.  Right renal:         Right renal artery is patent.  Inferior mesenteric: Patent.  Left iliac: Left iliac arteries are patent. The proximal left femoral arteries are patent.  Right iliac: Right iliac arteries are patent. The proximal right femoral arteries are patent.  Venous findings: IVC and renal veins are patent. Portal venous system is patent.  Review of the MIP images confirms the above findings.  NONVASCULAR FINDINGS:  There is a small-moderate sized right pleural effusion. Few patchy densities in the right middle lobe may represent atelectasis. No gross abnormality to the liver. There is a small amount of perihepatic ascites and fluid around the gallbladder. Suspect a small amount of periportal edema. No gross abnormality to the spleen or adrenal glands. No gross abnormality to the pancreas. Suspect scarring along the right kidney lower pole. The left kidney is edematous with minimal arterial flow. Findings compatible with  acute vascular occlusion of the left renal artery with left kidney ischemia.  Some of the images are limited by motion artifact. No gross abnormality to the stomach or duodenum. Small amount of free fluid in the pelvis. Uterus is absent. No gross abnormality to the urinary bladder. No gross abnormality to the small or large bowel.  No acute bone abnormality.  IMPRESSION: Large filling defect in the left atrium is suggestive for thrombus. An underlying lesion cannot be excluded. This may be better characterized with an echocardiogram.  Occlusion of the proximal left renal artery with no significant flow to the left kidney. 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 Findings compatible with thromboembolic disease originating from the left atrium.  Small amount of fluid in the right abdomen and pelvis.  Critical Value/emergent results were called by telephone at the time of interpretation on 09/03/2014 at 8:46 pm to Dr. Benjiman Core , who verbally acknowledged these results.   Electronically Signed   By: Richarda Overlie M.D.   On: 09/03/2014 21:05    EKG: Independently reviewed.  Assessment/Plan Principal Problem:   Atrial fibrillation with rapid ventricular response Active Problems:   Renal infarction   Hyperkalemia   Left atrial thrombus   Acute renal artery occlusion   1. A.Fib RVR - new onset 1. Rate control with Cardizem gtt 2. 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 Spoke with Dr. Arbie Cookey 2. Anticoagulate with heparin gtt 3. 3-4 days since onset of occlusion, surgical intervention is not-recommended at this point 4. Thankfully R kidney appears adequately functional at this point, creatinine is 1.2 up from baseline of 0.9. 5. Will get a 2d echo to get a better look at the left atrial thrombus 1. Existence of R sided thrombus is not yet excluded, and her cough could certainly be explained if she had a PE 2. Specifically  cannot exclude thrombus in the pulmonary veins as well 3. Pyuria - 1. UTI vs WBC in urine due to L renal infarction 2. Will empirically treat with rocephin while awaiting urine cultures, especially as there is risk of infarcted kidney becoming infected if she were to have a  UTI per my conversation with Dr. Arbie Cookey. 4. Hyperkalemia - 1. Q4H potassium labs 2. IVF 3. Tele monitor  Informally consulted (spoke with) Dr. Arbie Cookey on the phone, see above.  Code Status: Full  Family Communication: Family at bedside Disposition Plan: Admit to inpatient   Time spent: 70 min  Izekiel Flegel M. [facility] Pager [phone]  If 7AM-7PM, please contact the day team taking care of the patient [email or web address] Password [facility] 09/03/2014, 10:17 PM

## 2014-09-03 NOTE — ED Notes (Signed)
Patient transported to X-ray 

## 2014-09-03 NOTE — ED Notes (Signed)
Dr Rubin Payor at bedside reviewing EKG. Pt denies past hx of afib. Placed on cardiac monitor and 2L [place]

## 2014-09-03 NOTE — Progress Notes (Signed)
ANTICOAGULATION CONSULT NOTE - Initial Consult  Pharmacy Consult:  Heparin Indication:  Atrial clot and infarcted kidney  Allergies  Allergen Reactions  . Codeine Itching    Patient Measurements: Height:  (149.9 cm) Weight: 235 lb (106.595 kg) IBW/kg (Calculated) : 43.2 Heparin Dosing Weight: 72 kg  Vital Signs: Temp: 96 F (35.6 C) (08/22 2009) Temp Source: Oral (08/22 2009) BP: 121/101 mmHg (08/22 2009) Pulse Rate: 132 (08/22 2009)  Labs:  Recent Labs  09/03/14 1600 09/03/14 1740  HGB 13.4  --   HCT 40.1  --   PLT 219  --   APTT 25  --   LABPROT 16.6*  --   INR 1.33  --   CREATININE 1.22* 1.25*    Estimated Creatinine Clearance: 53.1 mL/min (by C-G formula based on Cr of 1.25).   Medical History: Past Medical History  Diagnosis Date  . Asthma   . Obesity   . Hypercholesteremia   . Heart murmur       Assessment: 58 YOF to start IV heparin for atrial clot and kidney infarct on CT.  Baseline labs reviewed.   Goal of Therapy:  Heparin level 0.3-0.7 units/ml Monitor platelets by anticoagulation protocol: Yes    Plan:  - Heparin 3500 units IV bolus x 1, then - Heparin gtt at 1000 units/hr - Check 6 hr HL - Daily HL / CBC   Muzamil Harker D. Laney Potash, PharmD, BCPS Pager:  [phone] 09/03/2014, 9:41 PM

## 2014-09-03 NOTE — ED Notes (Signed)
Patient transported to CT 

## 2014-09-04 ENCOUNTER — Inpatient Hospital Stay (HOSPITAL_COMMUNITY): Payer: Commercial Managed Care - HMO

## 2014-09-04 ENCOUNTER — Encounter (HOSPITAL_COMMUNITY): Payer: Self-pay | Admitting: Cardiology

## 2014-09-04 ENCOUNTER — Other Ambulatory Visit (HOSPITAL_COMMUNITY): Payer: Commercial Managed Care - HMO

## 2014-09-04 DIAGNOSIS — N179 Acute kidney failure, unspecified: Secondary | ICD-10-CM

## 2014-09-04 DIAGNOSIS — I4891 Unspecified atrial fibrillation: Secondary | ICD-10-CM

## 2014-09-04 DIAGNOSIS — I342 Nonrheumatic mitral (valve) stenosis: Secondary | ICD-10-CM

## 2014-09-04 DIAGNOSIS — I350 Nonrheumatic aortic (valve) stenosis: Secondary | ICD-10-CM

## 2014-09-04 DIAGNOSIS — I5031 Acute diastolic (congestive) heart failure: Secondary | ICD-10-CM

## 2014-09-04 LAB — BASIC METABOLIC PANEL
Anion gap: 12 (ref 5–15)
BUN: 20 mg/dL (ref 6–20)
CALCIUM: 8.7 mg/dL — AB (ref 8.9–10.3)
CO2: 18 mmol/L — ABNORMAL LOW (ref 22–32)
CREATININE: 1.49 mg/dL — AB (ref 0.44–1.00)
Chloride: 103 mmol/L (ref 101–111)
GFR calc Af Amer: 44 mL/min — ABNORMAL LOW (ref >60)
GFR, EST NON AFRICAN AMERICAN: 38 mL/min — AB (ref >60)
GLUCOSE: 177 mg/dL — AB (ref 65–99)
POTASSIUM: 5.9 mmol/L — AB (ref 3.5–5.1)
Sodium: 133 mmol/L — ABNORMAL LOW (ref 135–145)

## 2014-09-04 LAB — CBC
HCT: 41 % (ref 36.0–46.0)
Hemoglobin: 13.5 g/dL (ref 12.0–15.0)
MCH: 29.8 pg (ref 26.0–34.0)
MCHC: 32.9 g/dL (ref 30.0–36.0)
MCV: 90.5 fL (ref 78.0–100.0)
PLATELETS: 226 10*3/uL (ref 150–400)
RBC: 4.53 MIL/uL (ref 3.87–5.11)
RDW: 15.3 % (ref 11.5–15.5)
WBC: 14.4 10*3/uL — ABNORMAL HIGH (ref 4.0–10.5)

## 2014-09-04 LAB — HEPARIN LEVEL (UNFRACTIONATED)
HEPARIN UNFRACTIONATED: 0.16 [IU]/mL — AB (ref 0.30–0.70)
HEPARIN UNFRACTIONATED: 0.29 [IU]/mL — AB (ref 0.30–0.70)

## 2014-09-04 LAB — POTASSIUM
Potassium: 5.4 mmol/L — ABNORMAL HIGH (ref 3.5–5.1)
Potassium: 5.8 mmol/L — ABNORMAL HIGH (ref 3.5–5.1)

## 2014-09-04 LAB — TSH: TSH: 1.978 u[IU]/mL (ref 0.350–4.500)

## 2014-09-04 LAB — GLUCOSE, CAPILLARY: Glucose-Capillary: 174 mg/dL — ABNORMAL HIGH (ref 65–99)

## 2014-09-04 MED ORDER — HEPARIN BOLUS VIA INFUSION
1000.0000 [IU] | Freq: Once | INTRAVENOUS | Status: AC
  Administered 2014-09-04: 1000 [IU] via INTRAVENOUS
  Filled 2014-09-04: qty 1000

## 2014-09-04 MED ORDER — FUROSEMIDE 10 MG/ML IJ SOLN
20.0000 mg | Freq: Once | INTRAMUSCULAR | Status: AC
  Administered 2014-09-04: 20 mg via INTRAVENOUS
  Filled 2014-09-04: qty 2

## 2014-09-04 MED ORDER — SODIUM POLYSTYRENE SULFONATE 15 GM/60ML PO SUSP
30.0000 g | Freq: Once | ORAL | Status: AC
  Administered 2014-09-04: 30 g via ORAL
  Filled 2014-09-04: qty 120

## 2014-09-04 MED ORDER — OFF THE BEAT BOOK
Freq: Once | Status: AC
  Administered 2014-09-04: 14:00:00
  Filled 2014-09-04: qty 1

## 2014-09-04 MED ORDER — HEPARIN BOLUS VIA INFUSION
2000.0000 [IU] | Freq: Once | INTRAVENOUS | Status: AC
  Administered 2014-09-04: 2000 [IU] via INTRAVENOUS
  Filled 2014-09-04: qty 2000

## 2014-09-04 MED ORDER — ONDANSETRON HCL 4 MG/2ML IJ SOLN
4.0000 mg | Freq: Four times a day (QID) | INTRAMUSCULAR | Status: DC | PRN
  Administered 2014-09-04 (×3): 4 mg via INTRAVENOUS
  Filled 2014-09-04 (×3): qty 2

## 2014-09-04 MED ORDER — PNEUMOCOCCAL VAC POLYVALENT 25 MCG/0.5ML IJ INJ
0.5000 mL | INJECTION | INTRAMUSCULAR | Status: DC
Start: 2014-09-05 — End: 2014-09-18
  Filled 2014-09-04: qty 0.5

## 2014-09-04 NOTE — Progress Notes (Addendum)
UR Completed Klyde Banka Graves-Bigelow, RN,BSN [phone]  

## 2014-09-04 NOTE — Clinical Documentation Improvement (Signed)
Hospitalist  Based on the clinical findings below, please document any associated diagnoses/conditions the patient has or may have.   Obesity  Morbid Obesity  Other  Clinically Undetermined  Supporting Information:   BMI 48.8    Please exercise your independent, professional judgment when responding. A specific answer is not anticipated or expected.   Thank You, Leonette Most Birtie Fellman [employer] [facility] [place]

## 2014-09-04 NOTE — Progress Notes (Signed)
*  PRELIMINARY RESULTS* Echocardiogram 2D Echocardiogram has been performed.  [name] [name] 09/04/2014, 5:09 PM

## 2014-09-04 NOTE — Consult Note (Signed)
Primary cardiologist: Dr Mare Ferrari  HPI: 59 year old female with past medical history of mitral stenosis, aortic stenosis for evaluation of new-onset atrial fibrillation. Patient denies history of rheumatic fever. Last echocardiogram September 2015 showed normal LV function, elevated left ventricular filling pressures, moderate aortic stenosis with mean gradient 27 mmHg, trace aortic insufficiency, severe mitral stenosis with mean gradient 20 mmHg and moderately elevated pulmonary pressures. There was moderate to severe left atrial enlargement. Patient states that for the past 3 weeks she has noticed increasing cough, dyspnea on exertion, orthopnea and minimal pedal edema. She also has a chest tightness when she feels short of breath. She denies palpitations or syncope. She has had mild vague abdominal pain as well. She was admitted and noted to be in atrial fibrillation. Cardiology asked to evaluate.  Medications Prior to Admission  Medication Sig Dispense Refill  . b complex vitamins capsule Take 1 capsule by mouth daily.    . benzonatate (TESSALON) 200 MG capsule Take 1 capsule (200 mg total) by mouth 3 (three) times daily as needed for cough. 30 capsule 0  . cholecalciferol (VITAMIN D) 1000 UNITS tablet Take 1,000 Units by mouth daily. 2 tabs daily = 2,000    . Coenzyme Q10 (COQ-10) 10 MG CAPS Take 1 capsule by mouth daily.    . famotidine (PEPCID) 20 MG tablet One at bedtime 30 tablet 2  . mometasone-formoterol (DULERA) 100-5 MCG/ACT AERO Inhale 2 puffs into the lungs as needed for wheezing.    Marland Kitchen OMEGA-3 FATTY ACIDS PO Take 1,600 mg by mouth daily.    . pantoprazole (PROTONIX) 40 MG tablet Take 1 tablet (40 mg total) by mouth daily. Take 30-60 min before first meal of the day 30 tablet 2  . pravastatin (PRAVACHOL) 40 MG tablet Take 40 mg by mouth daily.     Marland Kitchen Respiratory Therapy Supplies (FLUTTER) DEVI Use as directed 1 each 0  . [DISCONTINUED] docusate sodium (COLACE) 100 MG capsule Take  1 capsule (100 mg total) by mouth every 12 (twelve) hours. (Patient not taking: Reported on 08/28/2014) 30 capsule 0  . [DISCONTINUED] ondansetron (ZOFRAN) 4 MG tablet Take 1 tablet (4 mg total) by mouth every 8 (eight) hours as needed for nausea or vomiting. (Patient not taking: Reported on 08/28/2014) 20 tablet 0  . [DISCONTINUED] ranitidine (ZANTAC) 150 MG tablet Take 1 tablet (150 mg total) by mouth 2 (two) times daily. (Patient not taking: Reported on 08/28/2014) 30 tablet 0    Allergies  Allergen Reactions  . Codeine Itching     Past Medical History  Diagnosis Date  . Asthma   . Obesity   . Hypercholesteremia   . Heart murmur   . Mitral stenosis   . Aortic stenosis     Past Surgical History  Procedure Laterality Date  . Abdominal hysterectomy    . Knee arthroscopy      Social History   Social History  . Marital Status: Divorced    Spouse Name: N/A  . Number of Children: 3  . Years of Education: N/A   Occupational History  . Not on file.   Social History Main Topics  . Smoking status: Never Smoker   . Smokeless tobacco: Never Used  . Alcohol Use: 0.0 oz/week    0 Standard drinks or equivalent per week     Comment: Rare  . Drug Use: No  . Sexual Activity: Not on file   Other Topics Concern  . Not on file   Social History  Narrative    Family History  Problem Relation Age of Onset  . Stroke Mother   . Prostate cancer Father     ROS:  Nonproductive cough but no fevers or chills, hemoptysis, dysphasia, odynophagia, melena, hematochezia, dysuria, hematuria, rash, seizure activity, PND, claudication. Remaining systems are negative.  Physical Exam:   Blood pressure 148/106, pulse 96, temperature 97.5 F (36.4 C), temperature source Axillary, resp. rate 25, height 4' 11"  (1.499 m), weight 241 lb (109.317 kg), SpO2 96 %.  General:  Well developed/obese in NAD Skin warm/dry Patient not depressed No peripheral clubbing Back-normal HEENT-normal/normal  eyelids Neck supple/normal carotid upstroke bilaterally; no bruits; no thyromegaly; JVD to angle of mandible chest - CTA/ normal expansion CV - irregular/normal S1 and S2; no rubs or gallops;  PMI nondisplaced; 2/6 systolic murmur left sternal border. S2 is not diminished. Abdomen -NT/ND, no HSM, no mass, + bowel sounds, no bruit Ext-no edema, chords, 2+ DP on the left, right DP not palpated. Neuro-grossly nonfocal  ECG Atrial fibrillation, RBBB  Results for orders placed or performed during the hospital encounter of 09/03/14 (from the past 48 hour(s))  Comprehensive metabolic panel     Status: Abnormal   Collection Time: 09/03/14  4:00 PM  Result Value Ref Range   Sodium 137 135 - 145 mmol/L   Potassium 5.3 (H) 3.5 - 5.1 mmol/L    Comment: SLIGHT HEMOLYSIS   Chloride 104 101 - 111 mmol/L   CO2 22 22 - 32 mmol/L   Glucose, Bld 158 (H) 65 - 99 mg/dL   BUN 21 (H) 6 - 20 mg/dL   Creatinine, Ser 1.22 (H) 0.44 - 1.00 mg/dL   Calcium 8.9 8.9 - 10.3 mg/dL   Total Protein 6.3 (L) 6.5 - 8.1 g/dL   Albumin 3.1 (L) 3.5 - 5.0 g/dL   AST 60 (H) 15 - 41 U/L   ALT 69 (H) 14 - 54 U/L   Alkaline Phosphatase 143 (H) 38 - 126 U/L   Total Bilirubin 1.0 0.3 - 1.2 mg/dL   GFR calc non Af Amer 48 (L) >60 mL/min   GFR calc Af Amer 55 (L) >60 mL/min    Comment: (NOTE) The eGFR has been calculated using the CKD EPI equation. This calculation has not been validated in all clinical situations. eGFR's persistently <60 mL/min signify possible Chronic Kidney Disease.    Anion gap 11 5 - 15  Lipase, blood     Status: Abnormal   Collection Time: 09/03/14  4:00 PM  Result Value Ref Range   Lipase 20 (L) 22 - 51 U/L  CBC with Differential     Status: Abnormal   Collection Time: 09/03/14  4:00 PM  Result Value Ref Range   WBC 10.7 (H) 4.0 - 10.5 K/uL   RBC 4.55 3.87 - 5.11 MIL/uL   Hemoglobin 13.4 12.0 - 15.0 g/dL   HCT 40.1 36.0 - 46.0 %   MCV 88.1 78.0 - 100.0 fL   MCH 29.5 26.0 - 34.0 pg   MCHC  33.4 30.0 - 36.0 g/dL   RDW 14.9 11.5 - 15.5 %   Platelets 219 150 - 400 K/uL   Neutrophils Relative % 85 (H) 43 - 77 %   Neutro Abs 9.1 (H) 1.7 - 7.7 K/uL   Lymphocytes Relative 9 (L) 12 - 46 %   Lymphs Abs 1.0 0.7 - 4.0 K/uL   Monocytes Relative 6 3 - 12 %   Monocytes Absolute 0.6 0.1 - 1.0 K/uL  Eosinophils Relative 0 0 - 5 %   Eosinophils Absolute 0.0 0.0 - 0.7 K/uL   Basophils Relative 0 0 - 1 %   Basophils Absolute 0.0 0.0 - 0.1 K/uL  Magnesium     Status: None   Collection Time: 09/03/14  4:00 PM  Result Value Ref Range   Magnesium 2.1 1.7 - 2.4 mg/dL  Protime-INR     Status: Abnormal   Collection Time: 09/03/14  4:00 PM  Result Value Ref Range   Prothrombin Time 16.6 (H) 11.6 - 15.2 seconds   INR 1.33 0.00 - 1.49  APTT     Status: None   Collection Time: 09/03/14  4:00 PM  Result Value Ref Range   aPTT 25 24 - 37 seconds  Basic metabolic panel     Status: Abnormal   Collection Time: 09/03/14  5:40 PM  Result Value Ref Range   Sodium 137 135 - 145 mmol/L   Potassium 5.6 (H) 3.5 - 5.1 mmol/L    Comment: NO VISIBLE HEMOLYSIS   Chloride 105 101 - 111 mmol/L   CO2 22 22 - 32 mmol/L   Glucose, Bld 173 (H) 65 - 99 mg/dL   BUN 22 (H) 6 - 20 mg/dL   Creatinine, Ser 1.25 (H) 0.44 - 1.00 mg/dL   Calcium 8.9 8.9 - 10.3 mg/dL   GFR calc non Af Amer 46 (L) >60 mL/min   GFR calc Af Amer 54 (L) >60 mL/min    Comment: (NOTE) The eGFR has been calculated using the CKD EPI equation. This calculation has not been validated in all clinical situations. eGFR's persistently <60 mL/min signify possible Chronic Kidney Disease.    Anion gap 10 5 - 15  Urinalysis, Routine w reflex microscopic (not at [facility])     Status: Abnormal   Collection Time: 09/03/14  7:00 PM  Result Value Ref Range   Color, Urine ORANGE (A) YELLOW    Comment: BIOCHEMICALS MAY BE AFFECTED BY COLOR   APPearance CLOUDY (A) CLEAR   Specific Gravity, Urine 1.026 1.005 - 1.030   pH 5.5 5.0 - 8.0   Glucose, UA  NEGATIVE NEGATIVE mg/dL   Hgb urine dipstick NEGATIVE NEGATIVE   Bilirubin Urine SMALL (A) NEGATIVE   Ketones, ur NEGATIVE NEGATIVE mg/dL   Protein, ur 100 (A) NEGATIVE mg/dL   Urobilinogen, UA 1.0 0.0 - 1.0 mg/dL   Nitrite NEGATIVE NEGATIVE   Leukocytes, UA LARGE (A) NEGATIVE  Urine microscopic-add on     Status: Abnormal   Collection Time: 09/03/14  7:00 PM  Result Value Ref Range   Squamous Epithelial / LPF FEW (A) RARE   WBC, UA 21-50 <3 WBC/hpf   Bacteria, UA FEW (A) RARE   Casts HYALINE CASTS (A) NEGATIVE  TSH     Status: None   Collection Time: 09/03/14  7:20 PM  Result Value Ref Range   TSH 2.528 0.350 - 4.500 uIU/mL    Comment: Performed at [facility]  I-Stat CG4 Lactic Acid, ED     Status: Abnormal   Collection Time: 09/03/14  7:28 PM  Result Value Ref Range   Lactic Acid, Venous 2.62 (HH) 0.5 - 2.0 mmol/L   Comment NOTIFIED PHYSICIAN   Urine culture     Status: None (Preliminary result)   Collection Time: 09/03/14  8:35 PM  Result Value Ref Range   Specimen Description URINE, CLEAN CATCH    Special Requests NONE    Culture      TOO YOUNG  TO READ Performed at [facility]    Report Status PENDING   MRSA PCR Screening     Status: None   Collection Time: 09/03/14  9:27 PM  Result Value Ref Range   MRSA by PCR NEGATIVE NEGATIVE    Comment:        The GeneXpert MRSA Assay (FDA approved for NASAL specimens only), is one component of a comprehensive MRSA colonization surveillance program. It is not intended to diagnose MRSA infection nor to guide or monitor treatment for MRSA infections.   Potassium     Status: Abnormal   Collection Time: 09/04/14 12:09 AM  Result Value Ref Range   Potassium 5.4 (H) 3.5 - 5.1 mmol/L  Basic metabolic panel     Status: Abnormal   Collection Time: 09/04/14  4:10 AM  Result Value Ref Range   Sodium 133 (L) 135 - 145 mmol/L   Potassium 5.9 (H) 3.5 - 5.1 mmol/L   Chloride 103 101 - 111 mmol/L   CO2 18 (L) 22 -  32 mmol/L   Glucose, Bld 177 (H) 65 - 99 mg/dL   BUN 20 6 - 20 mg/dL   Creatinine, Ser 1.49 (H) 0.44 - 1.00 mg/dL   Calcium 8.7 (L) 8.9 - 10.3 mg/dL   GFR calc non Af Amer 38 (L) >60 mL/min   GFR calc Af Amer 44 (L) >60 mL/min    Comment: (NOTE) The eGFR has been calculated using the CKD EPI equation. This calculation has not been validated in all clinical situations. eGFR's persistently <60 mL/min signify possible Chronic Kidney Disease.    Anion gap 12 5 - 15  Heparin level (unfractionated)     Status: Abnormal   Collection Time: 09/04/14  5:20 AM  Result Value Ref Range   Heparin Unfractionated 0.16 (L) 0.30 - 0.70 IU/mL    Comment:        IF HEPARIN RESULTS ARE BELOW EXPECTED VALUES, AND PATIENT DOSAGE HAS BEEN CONFIRMED, SUGGEST FOLLOW UP TESTING OF ANTITHROMBIN III LEVELS.   CBC     Status: Abnormal   Collection Time: 09/04/14  5:20 AM  Result Value Ref Range   WBC 14.4 (H) 4.0 - 10.5 K/uL   RBC 4.53 3.87 - 5.11 MIL/uL   Hemoglobin 13.5 12.0 - 15.0 g/dL   HCT 41.0 36.0 - 46.0 %   MCV 90.5 78.0 - 100.0 fL   MCH 29.8 26.0 - 34.0 pg   MCHC 32.9 30.0 - 36.0 g/dL   RDW 15.3 11.5 - 15.5 %   Platelets 226 150 - 400 K/uL  Glucose, capillary     Status: Abnormal   Collection Time: 09/04/14  5:32 AM  Result Value Ref Range   Glucose-Capillary 174 (H) 65 - 99 mg/dL  Potassium     Status: Abnormal   Collection Time: 09/04/14  7:50 AM  Result Value Ref Range   Potassium 5.8 (H) 3.5 - 5.1 mmol/L    Comment: SLIGHT HEMOLYSIS    Dg Abd Acute W/chest  09/03/2014   CLINICAL DATA:  Congestion for 2 weeks. History of heart murmur. Vomiting.  EXAM: DG ABDOMEN ACUTE W/ 1V CHEST  COMPARISON:  08/30/2014 and 05/19/2014  FINDINGS: Interstitial markings are mildly prominent but similar to the previous examination. Heart size is slightly prominent but stable. The trachea is midline. There is no evidence to suggest free air. Gas in the stomach. Small amount of gas and stool in the colon.  Nonobstructive bowel gas pattern. Mild degenerative changes at  the pubic symphysis.  IMPRESSION: Slightly prominent lung markings which are similar to the previous examinations. Difficult to exclude mild interstitial edema or vascular congestion.  Gaseous distention of the stomach.   Electronically Signed   By: Markus Daft M.D.   On: 09/03/2014 18:51   Ct Cta Abd/pel W/cm &/or W/o Cm  09/03/2014   CLINICAL DATA:  59 year old with left-sided abdominal pain for 2 weeks. New onset of atrial fibrillation.  EXAM: CT ANGIOGRAPHY ABDOMEN AND PELVIS  TECHNIQUE: Multidetector CT imaging of the abdomen and pelvis was performed using the standard protocol during bolus administration of intravenous contrast. Multiplanar reconstructed images including MIPs were obtained and reviewed to evaluate the vascular anatomy.  CONTRAST:  100 mL Omnipaque 350  COMPARISON:  Abdominal radiographs 09/03/2014  FINDINGS: ARTERIAL FINDINGS:  Aorta/Heart: Large filling defects in the posterior left atrium are most compatible with thrombus. The entire left atrium is not imaged. Right atrium appears to be enlarged. Cannot exclude thrombus in the right pulmonary veins. The abdominal aorta is patent without aneurysm or dissection.  Celiac axis: Celiac trunk and main branch vessels are patent, including the splenic artery.  Superior mesenteric: The superior mesenteric artery is patent. Main branch vessels are patent.  Left renal: Occlusion of the left renal artery just beyond the origin. Minimal blood flow to the left kidney even on the delayed images.  Right renal:         Right renal artery is patent.  Inferior mesenteric: Patent.  Left iliac: Left iliac arteries are patent. The proximal left femoral arteries are patent.  Right iliac: Right iliac arteries are patent. The proximal right femoral arteries are patent.  Venous findings: IVC and renal veins are patent. Portal venous system is patent.  Review of the MIP images confirms the above  findings.  NONVASCULAR FINDINGS:  There is a small-moderate sized right pleural effusion. Few patchy densities in the right middle lobe may represent atelectasis. No gross abnormality to the liver. There is a small amount of perihepatic ascites and fluid around the gallbladder. Suspect a small amount of periportal edema. No gross abnormality to the spleen or adrenal glands. No gross abnormality to the pancreas. Suspect scarring along the right kidney lower pole. The left kidney is edematous with minimal arterial flow. Findings compatible with acute vascular occlusion of the left renal artery with left kidney ischemia.  Some of the images are limited by motion artifact. No gross abnormality to the stomach or duodenum. Small amount of free fluid in the pelvis. Uterus is absent. No gross abnormality to the urinary bladder. No gross abnormality to the small or large bowel.  No acute bone abnormality.  IMPRESSION: Large filling defect in the left atrium is suggestive for thrombus. An underlying lesion cannot be excluded. This may be better characterized with an echocardiogram.  Occlusion of the proximal left renal artery with no significant flow to the left kidney. 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 Findings compatible with thromboembolic disease originating from the left atrium.  Small amount of fluid in the right abdomen and pelvis.  Critical Value/emergent results were called by telephone at the time of interpretation on 09/03/2014 at 8:46 pm to Dr. Davonna Belling , who verbally acknowledged these results.   Electronically Signed   By: Markus Daft M.D.   On: 09/03/2014 21:05    Assessment/Plan 1 new onset atrial fibrillation- patient presents with new onset atrial fibrillation. Based on symptoms this appears to have been approximately  3 weeks ago. Her CHADSvasc was one for female sex; however she has documented mitral stenosis and now has an embolic event to her  kidney. Agree with heparin. Once all procedures complete she will need long-term Coumadin. She would not be a candidate for DOAC because of valvular heart disease. Agree with Cardizem for rate control. Schedule echocardiogram to assess LV function and mitral stenosis. Check TSH. Note her CT shows possible left atrial thrombus. 2. Mitral stenosis/aortic stenosis-previous echocardiogram felt consistent with severe mitral stenosis and moderate aortic stenosis. Echocardiogram. She may require mitral and aortic valve replacement. 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 She also has diminished pulse in her right dorsalis pedis. However she is not having lower extremity pain. Check ABIs with Doppler. 4 acute diastolic congestive heart failure-patient presents with some dyspnea likely related to new onset atrial fibrillation superimposed on mitral stenosis. Plan gentle diuresis. Follow renal function closely. 5 acute renal failure-possibly secondary to embolic event to kidney. Follow renal function closely with diuresis. 6 hypertension-continue present medications and adjust as needed. 7 hyperkalemia-management per primary care.  Kirk Ruths MD 09/04/2014, 1:06 PM

## 2014-09-04 NOTE — Discharge Instructions (Signed)

## 2014-09-04 NOTE — Progress Notes (Signed)
ANTICOAGULATION CONSULT NOTE - Follow Up Consult  Pharmacy Consult for Heparin  Indication: atrial clot/infarcted kidney  Allergies  Allergen Reactions  . Codeine Itching    Patient Measurements: Height:  (149.9 cm) Weight: 241 lb (109.317 kg) IBW/kg (Calculated) : 43.2  Vital Signs: Temp: 97.5 F (36.4 C) (08/23 0409) Temp Source: Axillary (08/23 0409) BP: 148/106 mmHg (08/23 0755) Pulse Rate: 96 (08/23 0755)  Labs:  Recent Labs  09/03/14 1600 09/03/14 1740 09/04/14 0410 09/04/14 0520 09/04/14 1420  HGB 13.4  --   --  13.5  --   HCT 40.1  --   --  41.0  --   PLT 219  --   --  226  --   APTT 25  --   --   --   --   LABPROT 16.6*  --   --   --   --   INR 1.33  --   --   --   --   HEPARINUNFRC  --   --   --  0.16* 0.29*  CREATININE 1.22* 1.25* 1.49*  --   --     Estimated Creatinine Clearance: 45.2 mL/min (by C-G formula based on Cr of 1.49).  Assessment: 58 yof continues on IV heparin for anticoagulation of atrial clot and kidney infarct. Heparin level remains slightly low despite rate adjustments. No bleeding or other problems noted.   Goal of Therapy:  Heparin level 0.3-0.7 units/ml Monitor platelets by anticoagulation protocol: Yes   Plan:  - Heparin bolus 1000 units IV x 1 then increase rate to 1300 units/hr - Check a 6 hour heparin level - Continue daily heparin level and CBC - F/u plans for oral anticoagulation  Lysle Pearl, PharmD, BCPS Pager # [phone] 09/04/2014 3:36 PM

## 2014-09-04 NOTE — Progress Notes (Signed)
ANTICOAGULATION CONSULT NOTE - Follow Up Consult  Pharmacy Consult for Heparin  Indication: atrial clot/infarcted kidney  Allergies  Allergen Reactions  . Codeine Itching    Patient Measurements: Height:  (149.9 cm) Weight: 241 lb (109.317 kg) IBW/kg (Calculated) : 43.2  Vital Signs: Temp: 97.5 F (36.4 C) (08/23 0409) Temp Source: Axillary (08/23 0409) BP: 140/98 mmHg (08/23 0409) Pulse Rate: 148 (08/23 0409)  Labs:  Recent Labs  09/03/14 1600 09/03/14 1740 09/04/14 0410 09/04/14 0520  HGB 13.4  --   --   --   HCT 40.1  --   --   --   PLT 219  --   --   --   APTT 25  --   --   --   LABPROT 16.6*  --   --   --   INR 1.33  --   --   --   HEPARINUNFRC  --   --   --  0.16*  CREATININE 1.22* 1.25* 1.49*  --     Estimated Creatinine Clearance: 45.2 mL/min (by C-G formula based on Cr of 1.49).  Assessment: Initial heparin level is sub-therapeutic, no issues per RN.   Goal of Therapy:  Heparin level 0.3-0.7 units/ml Monitor platelets by anticoagulation protocol: Yes   Plan:  -Heparin 2000 units BOLUS -Increase heparin drip to 1150 units/hr -1400 HL -Daily CBC/HL -Monitor for bleeding  Abran Duke 09/04/2014,6:39 AM

## 2014-09-04 NOTE — Care Management Note (Addendum)
Case Management Note  Patient Details  Name: [name] [name] MRN: [id number] Date of Birth: Jul 05, 1955  Subjective/Objective:    59 year old female with past medical history of mitral stenosis, aortic stenosis admitted for evaluation of new-onset atrial fibrillation. Initiaetd on IV Cardizem gtt.                 Action/Plan: CM will continue to monitor for disposition needs. Pt has family support of daughter [name]   Expected Discharge Date:                  Expected Discharge Plan:  Home/Self Care  In-House Referral:     Discharge planning Services  CM Consult  Post Acute Care Choice:    Choice offered to:  NA  DME Arranged:    DME Agency:     HH Arranged:    HH Agency:     Status of Service:  In process, will continue to follow  Medicare Important Message Given:    Date Medicare IM Given:    Medicare IM give by:    Date Additional Medicare IM Given:    Additional Medicare Important Message give by:     If discussed at Long Length of Stay Meetings, dates discussed:    Additional Comments: 1408 09-12-14 Tomi Bamberger, RN,BSN [phone] Plan for cardiac cath 09-12-14. Continues on IV lasix for diuresis and IV heparin gtt. CM will continue to monitor for disposition needs.     1159 09-06-14 Tomi Bamberger, [place] [phone] Per MD notes: plan for aggressive rate control, target ventricular rate in the 60s. Diuresis.Will need surgery with mitral valve repair replacement and LA clot extraction, +/- aortic valve replacement.-Prior to that will need right and left heart cath (once she is able to lie flat, probably Friday) and TEE (assess if MV can be treated with surgical commissurotomy versus replacement with a mechanical prosthesis). CM will continue to monitor for additional needs.    Gala Lewandowsky, RN 09/04/2014, 11:38 AM

## 2014-09-04 NOTE — Clinical Documentation Improvement (Signed)
Hospitalist  Can the diagnosis of Atrial Fibrillation be further specified? Thank you   Chronic Atrial fibrillation  Paroxysmal Atrial fibrillation  Permanent Atrial fibrillation  Persistent Atrial fibrillation  Other  Clinically Undetermined  Document any associated diagnoses/conditions.   Supporting Information:  New onset Atrial Fibrillation started on Cardizem gtt   Please exercise your independent, professional judgment when responding. A specific answer is not anticipated or expected.   Thank You,  Leonette Most Danell [facility] [place]

## 2014-09-04 NOTE — Progress Notes (Signed)
[facility] PROGRESS NOTE   [name] [name] ZOX:[id number] DOB: March 19, 1955 DOA: 09/03/2014 PCP: Delorse Lek, MD  HPI/Subjective: Patient seen with daughter at bedside, still complaining about some cough and left-sided pain. No nausea or vomiting, no burning while passing urine.  Assessment/Plan: Principal Problem:   Atrial fibrillation with rapid ventricular response Active Problems:   Renal infarction   Hyperkalemia   Left atrial thrombus   Acute renal artery occlusion    Atrial fibrillation with RVR New onset atrial fibrillation with RVR patient presented to the hospital with heart rate of 140, EKG showed A. fib. Patient placed on Cardizem drip, currently rate is controlled. Patient is on IV heparin, patient will be on anticoagulation because of left atrial thrombus. Will obtain 2-D echocardiogram. This patients CHA2DS2-VASc Score and unadjusted Ischemic Stroke Rate (% per year) is equal to 0.6 % stroke rate/year from a score of 1 only for female gender  Above score calculated as 1 point each if present [CHF, HTN, DM, Vascular=MI/PAD/Aortic Plaque, Age if 59-74, or Female] Above score calculated as 2 points each if present [Age > 75, or Stroke/TIA/TE]  Left atrial thrombus This is discussed on admission with Dr. Zachery Dakins of the vascular surgery. He recommended no surgical intervention as this probably subacute, start anticoagulation with IV heparin. Obtain 2-D echocardiogram Cardiology consulted, await recommendation for what is better for anticoagulation DOACs or warfarin.  Occlusion of left renal artery CTA of abdomen showed thromboembolic occlusion of left renal artery, likely secondary to thromboembolism from left atrial thrombus. Left atrial thrombus can be explained because of atrial fibrillation but still will need to rule out hypercoagulable state.  Pyuria UTI versus WBC and urine due to left renal infarct. Patient empirically started on Rocephin,  await urine cultures.  Hyperkalemia IV fluids, give Kayexalate If kidney function continues to go down will consult nephrology..   Code Status: Full Code Family Communication: Plan discussed with the patient. Disposition Plan: Remains inpatient Diet:    Consultants:  Cardiology  Procedures:  None  Antibiotics:  None   Objective: Filed Vitals:   09/04/14 0755  BP: 148/106  Pulse: 96  Temp:   Resp: 25    Intake/Output Summary (Last 24 hours) at 09/04/14 1139 Last data filed at 09/04/14 0840  Gross per 24 hour  Intake 1586.1 ml  Output      0 ml  Net 1586.1 ml   Filed Weights   09/03/14 1527 09/03/14 2131  Weight: 106.595 kg (235 lb) 109.317 kg (241 lb)    Exam: General: Alert and awake, oriented x3, not in any acute distress. HEENT: anicteric sclera, pupils reactive to light and accommodation, EOMI CVS: S1-S2 clear, no murmur rubs or gallops Chest: clear to auscultation bilaterally, no wheezing, rales or rhonchi Abdomen: soft nontender, nondistended, normal bowel sounds, no organomegaly Extremities: no cyanosis, clubbing or edema noted bilaterally Neuro: Cranial nerves II-XII intact, no focal neurological deficits  Data Reviewed: Basic Metabolic Panel:  Recent Labs Lab 09/03/14 1600 09/03/14 1740 09/04/14 0009 09/04/14 0410 09/04/14 0750  NA 137 137  --  133*  --   K 5.3* 5.6* 5.4* 5.9* 5.8*  CL 104 105  --  103  --   CO2 22 22  --  18*  --   GLUCOSE 158* 173*  --  177*  --   BUN 21* 22*  --  20  --   CREATININE 1.22* 1.25*  --  1.49*  --   CALCIUM 8.9 8.9  --  8.7*  --  MG 2.1  --   --   --   --    Liver Function Tests:  Recent Labs Lab 09/03/14 1600  AST 60*  ALT 69*  ALKPHOS 143*  BILITOT 1.0  PROT 6.3*  ALBUMIN 3.1*    Recent Labs Lab 09/03/14 1600  LIPASE 20*   No results for input(s): AMMONIA in the last 168 hours. CBC:  Recent Labs Lab 09/03/14 1600 09/04/14 0520  WBC 10.7* 14.4*  NEUTROABS 9.1*  --   HGB 13.4  13.5  HCT 40.1 41.0  MCV 88.1 90.5  PLT 219 226   Cardiac Enzymes: No results for input(s): CKTOTAL, CKMB, CKMBINDEX, TROPONINI in the last 168 hours. BNP (last 3 results) No results for input(s): BNP in the last 8760 hours.  ProBNP (last 3 results) No results for input(s): PROBNP in the last 8760 hours.  CBG:  Recent Labs Lab 09/04/14 0532  GLUCAP 174*    Micro Recent Results (from the past 240 hour(s))  Urine culture     Status: None (Preliminary result)   Collection Time: 09/03/14  8:35 PM  Result Value Ref Range Status   Specimen Description URINE, CLEAN CATCH  Final   Special Requests NONE  Final   Culture   Final    TOO YOUNG TO READ Performed at [facility]    Report Status PENDING  Incomplete  MRSA PCR Screening     Status: None   Collection Time: 09/03/14  9:27 PM  Result Value Ref Range Status   MRSA by PCR NEGATIVE NEGATIVE Final    Comment:        The GeneXpert MRSA Assay (FDA approved for NASAL specimens only), is one component of a comprehensive MRSA colonization surveillance program. It is not intended to diagnose MRSA infection nor to guide or monitor treatment for MRSA infections.      Studies: Dg Abd Acute W/chest  09/03/2014   CLINICAL DATA:  Congestion for 2 weeks. History of heart murmur. Vomiting.  EXAM: DG ABDOMEN ACUTE W/ 1V CHEST  COMPARISON:  08/30/2014 and 05/19/2014  FINDINGS: Interstitial markings are mildly prominent but similar to the previous examination. Heart size is slightly prominent but stable. The trachea is midline. There is no evidence to suggest free air. Gas in the stomach. Small amount of gas and stool in the colon. Nonobstructive bowel gas pattern. Mild degenerative changes at the pubic symphysis.  IMPRESSION: Slightly prominent lung markings which are similar to the previous examinations. Difficult to exclude mild interstitial edema or vascular congestion.  Gaseous distention of the stomach.   Electronically  Signed   By: Richarda Overlie M.D.   On: 09/03/2014 18:51   Ct Cta Abd/pel W/cm &/or W/o Cm  09/03/2014   CLINICAL DATA:  59 year old with left-sided abdominal pain for 2 weeks. New onset of atrial fibrillation.  EXAM: CT ANGIOGRAPHY ABDOMEN AND PELVIS  TECHNIQUE: Multidetector CT imaging of the abdomen and pelvis was performed using the standard protocol during bolus administration of intravenous contrast. Multiplanar reconstructed images including MIPs were obtained and reviewed to evaluate the vascular anatomy.  CONTRAST:  100 mL Omnipaque 350  COMPARISON:  Abdominal radiographs 09/03/2014  FINDINGS: ARTERIAL FINDINGS:  Aorta/Heart: Large filling defects in the posterior left atrium are most compatible with thrombus. The entire left atrium is not imaged. Right atrium appears to be enlarged. Cannot exclude thrombus in the right pulmonary veins. The abdominal aorta is patent without aneurysm or dissection.  Celiac axis: Celiac trunk and main branch  vessels are patent, including the splenic artery.  Superior mesenteric: The superior mesenteric artery is patent. Main branch vessels are patent.  Left renal: Occlusion of the left renal artery just beyond the origin. Minimal blood flow to the left kidney even on the delayed images.  Right renal:         Right renal artery is patent.  Inferior mesenteric: Patent.  Left iliac: Left iliac arteries are patent. The proximal left femoral arteries are patent.  Right iliac: Right iliac arteries are patent. The proximal right femoral arteries are patent.  Venous findings: IVC and renal veins are patent. Portal venous system is patent.  Review of the MIP images confirms the above findings.  NONVASCULAR FINDINGS:  There is a small-moderate sized right pleural effusion. Few patchy densities in the right middle lobe may represent atelectasis. No gross abnormality to the liver. There is a small amount of perihepatic ascites and fluid around the gallbladder. Suspect a small amount of  periportal edema. No gross abnormality to the spleen or adrenal glands. No gross abnormality to the pancreas. Suspect scarring along the right kidney lower pole. The left kidney is edematous with minimal arterial flow. Findings compatible with acute vascular occlusion of the left renal artery with left kidney ischemia.  Some of the images are limited by motion artifact. No gross abnormality to the stomach or duodenum. Small amount of free fluid in the pelvis. Uterus is absent. No gross abnormality to the urinary bladder. No gross abnormality to the small or large bowel.  No acute bone abnormality.  IMPRESSION: Large filling defect in the left atrium is suggestive for thrombus. An underlying lesion cannot be excluded. This may be better characterized with an echocardiogram.  Occlusion of the proximal left renal artery with no significant flow to the left kidney. 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 Findings compatible with thromboembolic disease originating from the left atrium.  Small amount of fluid in the right abdomen and pelvis.  Critical Value/emergent results were called by telephone at the time of interpretation on 09/03/2014 at 8:46 pm to Dr. Benjiman Core , who verbally acknowledged these results.   Electronically Signed   By: Richarda Overlie M.D.   On: 09/03/2014 21:05    Scheduled Meds: . cefTRIAXone (ROCEPHIN)  IV  1 g Intravenous Q24H  . famotidine  20 mg Oral QHS  . mometasone-formoterol  2 puff Inhalation BID  . pantoprazole  40 mg Oral Daily  . pravastatin  40 mg Oral q1800  . sodium chloride  3 mL Intravenous Q12H   Continuous Infusions: . sodium chloride 125 mL/hr at 09/04/14 1020  . diltiazem (CARDIZEM) infusion 15 mg/hr (09/04/14 0836)  . heparin 1,150 Units/hr (09/04/14 0650)       Time spent: 35 minutes    [facility]  [facility] Pager [phone] If 7PM-7AM, please contact night-coverage at [email or web address], password  [facility] 09/04/2014, 11:39 AM  LOS: 1 day

## 2014-09-05 ENCOUNTER — Inpatient Hospital Stay (HOSPITAL_COMMUNITY): Payer: Commercial Managed Care - HMO

## 2014-09-05 ENCOUNTER — Encounter (HOSPITAL_COMMUNITY): Payer: Commercial Managed Care - HMO

## 2014-09-05 DIAGNOSIS — I080 Rheumatic disorders of both mitral and aortic valves: Secondary | ICD-10-CM | POA: Diagnosis present

## 2014-09-05 DIAGNOSIS — I213 ST elevation (STEMI) myocardial infarction of unspecified site: Secondary | ICD-10-CM

## 2014-09-05 DIAGNOSIS — N280 Ischemia and infarction of kidney: Secondary | ICD-10-CM

## 2014-09-05 LAB — CBC
HCT: 40.3 % (ref 36.0–46.0)
Hemoglobin: 13.3 g/dL (ref 12.0–15.0)
MCH: 29.6 pg (ref 26.0–34.0)
MCHC: 33 g/dL (ref 30.0–36.0)
MCV: 89.6 fL (ref 78.0–100.0)
PLATELETS: 226 10*3/uL (ref 150–400)
RBC: 4.5 MIL/uL (ref 3.87–5.11)
RDW: 15.4 % (ref 11.5–15.5)
WBC: 26 10*3/uL — AB (ref 4.0–10.5)

## 2014-09-05 LAB — URINALYSIS, ROUTINE W REFLEX MICROSCOPIC
GLUCOSE, UA: NEGATIVE mg/dL
KETONES UR: 15 mg/dL — AB
Nitrite: POSITIVE — AB
PH: 5.5 (ref 5.0–8.0)
Protein, ur: >300 mg/dL — AB
Specific Gravity, Urine: 1.042 — ABNORMAL HIGH (ref 1.005–1.030)
Urobilinogen, UA: 1 mg/dL (ref 0.0–1.0)

## 2014-09-05 LAB — BASIC METABOLIC PANEL
Anion gap: 11 (ref 5–15)
BUN: 24 mg/dL — AB (ref 6–20)
CALCIUM: 8.5 mg/dL — AB (ref 8.9–10.3)
CO2: 17 mmol/L — ABNORMAL LOW (ref 22–32)
CREATININE: 1.91 mg/dL — AB (ref 0.44–1.00)
Chloride: 104 mmol/L (ref 101–111)
GFR calc Af Amer: 32 mL/min — ABNORMAL LOW (ref >60)
GFR, EST NON AFRICAN AMERICAN: 28 mL/min — AB (ref >60)
GLUCOSE: 123 mg/dL — AB (ref 65–99)
Potassium: 4.9 mmol/L (ref 3.5–5.1)
Sodium: 132 mmol/L — ABNORMAL LOW (ref 135–145)

## 2014-09-05 LAB — HEPARIN LEVEL (UNFRACTIONATED)
Heparin Unfractionated: 0.15 IU/mL — ABNORMAL LOW (ref 0.30–0.70)
Heparin Unfractionated: 0.29 IU/mL — ABNORMAL LOW (ref 0.30–0.70)
Heparin Unfractionated: 0.29 IU/mL — ABNORMAL LOW (ref 0.30–0.70)

## 2014-09-05 LAB — SODIUM, URINE, RANDOM

## 2014-09-05 LAB — URINE CULTURE

## 2014-09-05 LAB — URINE MICROSCOPIC-ADD ON

## 2014-09-05 LAB — ANTITHROMBIN III: AntiThromb III Func: 63 % — ABNORMAL LOW (ref 75–120)

## 2014-09-05 LAB — CREATININE, URINE, RANDOM: Creatinine, Urine: 181.35 mg/dL

## 2014-09-05 MED ORDER — HEPARIN BOLUS VIA INFUSION
1500.0000 [IU] | Freq: Once | INTRAVENOUS | Status: AC
  Administered 2014-09-05: 1500 [IU] via INTRAVENOUS
  Filled 2014-09-05: qty 1500

## 2014-09-05 MED ORDER — FUROSEMIDE 10 MG/ML IJ SOLN
40.0000 mg | Freq: Once | INTRAMUSCULAR | Status: AC
  Administered 2014-09-05: 40 mg via INTRAVENOUS
  Filled 2014-09-05: qty 4

## 2014-09-05 MED ORDER — METOPROLOL TARTRATE 12.5 MG HALF TABLET
12.5000 mg | ORAL_TABLET | Freq: Two times a day (BID) | ORAL | Status: DC
  Administered 2014-09-05 – 2014-09-06 (×3): 12.5 mg via ORAL
  Filled 2014-09-05 (×3): qty 1

## 2014-09-05 MED ORDER — HEPARIN BOLUS VIA INFUSION
2000.0000 [IU] | Freq: Once | INTRAVENOUS | Status: AC
  Administered 2014-09-05: 2000 [IU] via INTRAVENOUS
  Filled 2014-09-05: qty 2000

## 2014-09-05 MED ORDER — MORPHINE SULFATE (PF) 2 MG/ML IV SOLN: 2.0000 mg | INTRAVENOUS | Status: DC | PRN

## 2014-09-05 MED ORDER — MORPHINE SULFATE 15 MG PO TABS
15.0000 mg | ORAL_TABLET | ORAL | Status: DC | PRN
  Administered 2014-09-05 – 2014-09-17 (×5): 15 mg via ORAL
  Filled 2014-09-05 (×6): qty 1

## 2014-09-05 NOTE — Progress Notes (Signed)
ANTICOAGULATION CONSULT NOTE - Follow Up Consult  Pharmacy Consult for Heparin  Indication: atrial clot/infarcted kidney  Allergies  Allergen Reactions  . Codeine Itching    Patient Measurements: Height:  (149.9 cm) Weight: 260 lb 4.8 oz (118.071 kg) IBW/kg (Calculated) : 43.2  Vital Signs: Temp: 97.5 F (36.4 C) (08/24 0748) Temp Source: Oral (08/24 0748) BP: 133/94 mmHg (08/24 0712) Pulse Rate: 94 (08/24 0748)  Labs:  Recent Labs  09/03/14 1600 09/03/14 1740 09/04/14 0410  09/04/14 0520 09/04/14 1420 09/05/14 0010 09/05/14 0350  HGB 13.4  --   --   --  13.5  --   --  13.3  HCT 40.1  --   --   --  41.0  --   --  40.3  PLT 219  --   --   --  226  --   --  226  APTT 25  --   --   --   --   --   --   --   LABPROT 16.6*  --   --   --   --   --   --   --   INR 1.33  --   --   --   --   --   --   --   HEPARINUNFRC  --   --   --   < > 0.16* 0.29* 0.29* 0.29*  CREATININE 1.22* 1.25* 1.49*  --   --   --   --  1.91*  < > = values in this interval not displayed.  Estimated Creatinine Clearance: 37.1 mL/min (by C-G formula based on Cr of 1.91).  Assessment: 58 yof continues on IV heparin for anticoagulation of atrial clot and kidney infarct. Heparin level remains slightly low despite multiple rate adjustments. No bleeding or other problems noted. CBC is WNL.   Goal of Therapy:  Heparin level 0.3-0.7 units/ml Monitor platelets by anticoagulation protocol: Yes   Plan:  - Heparin bolus 1500 units IV x 1 then increase rate to 1500 units/hr - Check a 6 hour heparin level - Continue daily heparin level and CBC - F/u plans for oral anticoagulation  Lysle Pearl, PharmD, BCPS Pager # [phone] 09/05/2014 9:56 AM

## 2014-09-05 NOTE — Progress Notes (Signed)
Subjective: Feeling a little better.  She gets very SOB with short ambulation.    Objective: Vital signs in last 24 hours: Temp:  [97.5 F (36.4 C)-98.5 F (36.9 C)] 98.3 F (36.8 C) (08/24 1112) Pulse Rate:  [55-94] 75 (08/24 1112) Resp:  [24-38] 36 (08/24 1112) BP: (118-146)/(67-100) 134/89 mmHg (08/24 1112) SpO2:  [87 %-98 %] 92 % (08/24 1112) Weight:  [260 lb 4.8 oz (118.071 kg)] 260 lb 4.8 oz (118.071 kg) (08/24 0516) Last BM Date: 09/03/14  Intake/Output from previous day: 08/23 0701 - 08/24 0700 In: 200 [P.O.:200] Out: 800 [Urine:800] Intake/Output this shift: Total I/O In: 360 [P.O.:360] Out: 350 [Urine:350]  Medications Scheduled Meds: . cefTRIAXone (ROCEPHIN)  IV  1 g Intravenous Q24H  . mometasone-formoterol  2 puff Inhalation BID  . pantoprazole  40 mg Oral Daily  . pneumococcal 23 valent vaccine  0.5 mL Intramuscular Tomorrow-1000  . pravastatin  40 mg Oral q1800  . sodium chloride  3 mL Intravenous Q12H   Continuous Infusions: . diltiazem (CARDIZEM) infusion 15 mg/hr (09/05/14 0907)  . heparin 1,500 Units/hr (09/05/14 1027)   PRN Meds:.benzonatate, morphine, morphine injection, ondansetron  PE: General appearance: alert, cooperative and no distress Lungs: Mild basilar crackles.  Decreased BS throughout.  Heart: irregularly irregular rhythm and 1/6 sys MM Extremities: Trace LEE Pulses: 2+ radials and 1+ DP/PT Skin: Warm and dry Neurologic: Grossly normal  Lab Results:   Recent Labs  09/03/14 1600 09/04/14 0520 09/05/14 0350  WBC 10.7* 14.4* 26.0*  HGB 13.4 13.5 13.3  HCT 40.1 41.0 40.3  PLT 219 226 226   BMET  Recent Labs  09/03/14 1740  09/04/14 0410 09/04/14 0750 09/05/14 0350  NA 137  --  133*  --  132*  K 5.6*  < > 5.9* 5.8* 4.9  CL 105  --  103  --  104  CO2 22  --  18*  --  17*  GLUCOSE 173*  --  177*  --  123*  BUN 22*  --  20  --  24*  CREATININE 1.25*  --  1.49*  --  1.91*  CALCIUM 8.9  --  8.7*  --  8.5*  < > =  values in this interval not displayed. PT/INR  Recent Labs  09/03/14 1600  LABPROT 16.6*  INR 1.33   Echocardiogram:  Study Conclusions  - Left ventricle: The cavity size was normal. Wall thickness was increased in a pattern of moderate LVH. Systolic function was normal. The estimated ejection fraction was in the range of 50% to 55%. Wall motion was normal; there were no regional wall motion abnormalities. - Ventricular septum: The contour showed diastolic flattening. - Aortic valve: There was mild to moderate stenosis. There was trivial regurgitation. Valve area (VTI): 1.38 cm^2. Valve area (Vmax): 1.52 cm^2. Valve area (Vmean): 1.41 cm^2. - Mitral valve: The findings are consistent with severe stenosis. Valve area by continuity equation (using LVOT flow): 1.15 cm^2. - Left atrium: The atrium was mildly dilated. There was a largethrombus. - Right ventricle: Systolic function was severely reduced. - Right atrium: The atrium was moderately dilated. - Tricuspid valve: There was moderate regurgitation. - Pulmonary arteries: Systolic pressure was moderately increased. PA peak pressure: 64 mm Hg (S).  Assessment/Plan   59 year old female with past medical history of mitral stenosis, aortic stenosis for evaluation of new-onset atrial fibrillation. Patient denies history of rheumatic fever. Last echocardiogram September 2015 showed normal LV function, elevated left ventricular filling pressures,  moderate aortic stenosis with mean gradient 27 mmHg, trace aortic insufficiency, severe mitral stenosis with mean gradient 20 mmHg and moderately elevated pulmonary pressures  Principal Problem:   Atrial fibrillation with rapid ventricular response Overall poorly controlled HR.   On IV cardizem and IV heparin.  Will need coumadin due to valvular disease.  Will add lopressor for better HR control.   TSH WNL.  If she surgery is required for her valves, perhaps a MAZE procedure can  be completed as well.      MV stenosis, severe Mean gradient .  Area = 0.52cm^2. Previously 0.39 cm^2 .  Based on new echo MV has Not changed much.  Now with Afib she appears very symptomatic.  Surgery maybe required at this point.     Aortic valve stenosis, mild to moderate.    Renal infarction   Hyperkalemia  Resolved.    Left atrial thrombus  IV heparin.   Acute renal artery occlusion  Source LV thrombus   Leukocytosis  ? From UTI   LOS: 2 days    HAGER, BRYAN PA-C 09/05/2014 12:19 PM  I have seen and examined the patient along with HAGER, BRYAN PA-C.  I have reviewed the chart, notes and new data.  I agree with PA's note.  Key new complaints: still orthopneic, but improved Key examination changes: irregular rhythm, marked JVD, >10 cm, loud/palpable S2 and RV heave, loud diastolic rumble, liver is not pulsatile Key new findings / data: echo and CT images reviewed. Very large left atrial thrombus attached to the posterior LA wall, also probably filling the appendage.  Severely decompensated after onset of atrial fibrillation, unmasking severe rheumatic MS.  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 Diuresis. Will need surgery with mitral valve repair replacement and LA clot extraction, +/- aortic valve replacement. Prior to that will need right and left heart cath (once she is able to lie flat, probably Friday) and TEE (assess if MV can be treated with surgical commissurotomy versus replacement with a mechanical prosthesis). Although her PA pressure is moderate to severely elevated, I suspect that this will improve with better rate control and diuresis and will not prohibit valve surgery.  Thurmon Fair, MD, [facility] [phone] 09/05/2014, 2:31 PM

## 2014-09-05 NOTE — Progress Notes (Signed)
[facility] PROGRESS NOTE  [name] [name] WUJ:[id number] DOB: 1955-09-22 DOA: 09/03/2014 PCP: Delorse Lek, MD  Brief Summary  The patient is a 59 year old female with history of asthma/pseudo-asthma who presented to the emergency department with productive cough for 1.5 weeks.  She also had severe left-sided abdominal, flank, and back pain. She was seen 1 week prior to admission by her pulmonologist to gave her medication for bronchitis and respiratory infection.  She was found to be in a-fib with RVR.    Assessment/Plan  A. fib with RVR, new onset, chads 2 vascular score of 1 for female gender, 0.6% chance of stroke per year, does not need anticoagulation other than aspirin but has other indications for full anticoagulation -  Unable to titrate off diltiazem drip secondary to rising heart rate -  Telemetry: Currently heart rate A. 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 Zachery Dakins of the vascular surgery. -  He recommended no surgical intervention as this probably subacute, start anticoagulation with IV heparin. -  Cardiology consulted, await recommendation for what is better for anticoagulation DOACs or warfarin -  Hypercoagulable panel  Acute hypoxic respiratory failure secondary to acute diastolic heart failure secondary to atrial fibrillation.  She also has severely reduced systolic function of the right ventricle and a moderately dilated RA, PA peak pressure of 64 mmHg suggesting moderate pulmonary hypertension. -  CXR with CHF and interstitial edema -  Daily weights -  Strict I/O -  D/c IVF  Acute kidney injury, creatinine baseline of 0.9 and currently 1.91, probably secondary to renal infarct and recent IV contrast -  Check UA and FENa  -  No evidence of obstruction on CT -  Minimize nephrotoxins and renally dose  medications  Severe mitral valve stenosis/mild to moderate aortic valve stenosis -  Defer timing of possible mitral and aortic valve replacements to cardiology and cardiothoracic surgery  Probable peripheral vascular disease -  Follow-up ABI  Hypertension, blood pressure mildly elevated  Hyperkalemia, resolved with IV fluids and Kayexalate  Pyuria -  Continue ceftriaxone and follow-up urine culture  Leukocytosis may be reactive to kidney infarction -  Repeat CXR and UA to eval for underlying infection.  Afebrile -  No recent steroids  Diet:  Full liquid  Access:  PIV IVF:  off Proph:  heparin  Code Status: full Family Communication: patient and her daughter Disposition Plan: pending HR controlled on oral medications, creatinine improving   Consultants:  Cardiology  Vascular surgery by phone  Nephrology  Procedures:  KUB  CT angio abd/pelvis  ECHO  CXR  Antibiotics:  Ceftriaxone   HPI/Subjective:  She continues to have severe left flank and left upper quadrant pain. She has been feeling a little bit Magdalyn Arenivas of breath and her daughter states that she grunts while she sleeps which is new.  Objective: Filed Vitals:   09/05/14 0102 09/05/14 0516 09/05/14 0712 09/05/14 0748  BP:  134/92 133/94   Pulse:  64 55 94  Temp:  98.2 F (36.8 C)  97.5 F (36.4 C)  TempSrc:  Oral  Oral  Resp:  33 38 31  Height:      Weight:  118.071 kg (260 lb 4.8 oz)    SpO2: 98% 93% 93% 97%    Intake/Output Summary (Last 24 hours) at 09/05/14 1036 Last data filed at 09/05/14 0900  Gross per 24 hour  Intake    360 ml  Output    800 ml  Net   -440 ml   Filed Weights   09/03/14 1527 09/03/14 2131 09/05/14 0516  Weight: 106.595 kg (235 lb) 109.317 kg (241 lb) 118.071 kg (260 lb 4.8 oz)   Body mass index is 52.55 kg/(m^2).  Exam:   General:  Adult female, No acute distress  HEENT:  NCAT, MMM  Cardiovascular:  IRRR, 2/6 murmur, rate in the 120s, 2+ pulses, warm  extremities  Respiratory:  Diminished bilateral breath sounds, no obvious wheezes, rales, rhonchi, no increased WOB  Abdomen:   NABS, soft, nondistended, obese, tender to palpation in the left upper quadrant without rebound or guarding  MSK:   Normal tone and bulk, 1+ pitting bilateral LEE  Neuro:  Grossly intact  Data Reviewed: Basic Metabolic Panel:  Recent Labs Lab 09/03/14 1600 09/03/14 1740 09/04/14 0009 09/04/14 0410 09/04/14 0750 09/05/14 0350  NA 137 137  --  133*  --  132*  K 5.3* 5.6* 5.4* 5.9* 5.8* 4.9  CL 104 105  --  103  --  104  CO2 22 22  --  18*  --  17*  GLUCOSE 158* 173*  --  177*  --  123*  BUN 21* 22*  --  20  --  24*  CREATININE 1.22* 1.25*  --  1.49*  --  1.91*  CALCIUM 8.9 8.9  --  8.7*  --  8.5*  MG 2.1  --   --   --   --   --    Liver Function Tests:  Recent Labs Lab 09/03/14 1600  AST 60*  ALT 69*  ALKPHOS 143*  BILITOT 1.0  PROT 6.3*  ALBUMIN 3.1*    Recent Labs Lab 09/03/14 1600  LIPASE 20*   No results for input(s): AMMONIA in the last 168 hours. CBC:  Recent Labs Lab 09/03/14 1600 09/04/14 0520 09/05/14 0350  WBC 10.7* 14.4* 26.0*  NEUTROABS 9.1*  --   --   HGB 13.4 13.5 13.3  HCT 40.1 41.0 40.3  MCV 88.1 90.5 89.6  PLT 219 226 226    Recent Results (from the past 240 hour(s))  Urine culture     Status: None (Preliminary result)   Collection Time: 09/03/14  8:35 PM  Result Value Ref Range Status   Specimen Description URINE, CLEAN CATCH  Final   Special Requests NONE  Final   Culture   Final    TOO YOUNG TO READ Performed at [facility]    Report Status PENDING  Incomplete  MRSA PCR Screening     Status: None   Collection Time: 09/03/14  9:27 PM  Result Value Ref Range Status   MRSA by PCR NEGATIVE NEGATIVE Final    Comment:        The GeneXpert MRSA Assay (FDA approved for NASAL specimens only), is one component of a comprehensive MRSA colonization surveillance program. It is not intended to  diagnose MRSA infection nor to guide or monitor treatment for MRSA infections.      Studies: Dg Chest Port 1 View  09/05/2014   CLINICAL DATA:  Follow-up of elevated white blood cell count  EXAM: PORTABLE CHEST - 1 VIEW  COMPARISON:  Chest x-ray of September 03, 2014  FINDINGS: The patient is positioned in a lordotic matter today which limits the apparent expansion of the lungs. The interstitial markings remain increased and there is subsegmental atelectasis  at the right lung base. There is a small amount of fluid in the minor fissure. The cardiac silhouette remains enlarged. The pulmonary vascularity is more conspicuous today. The bony thorax exhibits no acute abnormality.  IMPRESSION: CHF with mild interstitial edema. Subsegmental atelectasis at the right lung base. When the patient can tolerate the procedure, a PA and lateral chest x-ray would be useful.   Electronically Signed   By: David  [place] M.D.   On: 09/05/2014 08:17   Dg Abd Acute W/chest  09/03/2014   CLINICAL DATA:  Congestion for 2 weeks. History of heart murmur. Vomiting.  EXAM: DG ABDOMEN ACUTE W/ 1V CHEST  COMPARISON:  08/30/2014 and 05/19/2014  FINDINGS: Interstitial markings are mildly prominent but similar to the previous examination. Heart size is slightly prominent but stable. The trachea is midline. There is no evidence to suggest free air. Gas in the stomach. Small amount of gas and stool in the colon. Nonobstructive bowel gas pattern. Mild degenerative changes at the pubic symphysis.  IMPRESSION: Slightly prominent lung markings which are similar to the previous examinations. Difficult to exclude mild interstitial edema or vascular congestion.  Gaseous distention of the stomach.   Electronically Signed   By: Richarda Overlie M.D.   On: 09/03/2014 18:51   Ct Cta Abd/pel W/cm &/or W/o Cm  09/03/2014   CLINICAL DATA:  59 year old with left-sided abdominal pain for 2 weeks. New onset of atrial fibrillation.  EXAM: CT ANGIOGRAPHY ABDOMEN  AND PELVIS  TECHNIQUE: Multidetector CT imaging of the abdomen and pelvis was performed using the standard protocol during bolus administration of intravenous contrast. Multiplanar reconstructed images including MIPs were obtained and reviewed to evaluate the vascular anatomy.  CONTRAST:  100 mL Omnipaque 350  COMPARISON:  Abdominal radiographs 09/03/2014  FINDINGS: ARTERIAL FINDINGS:  Aorta/Heart: Large filling defects in the posterior left atrium are most compatible with thrombus. The entire left atrium is not imaged. Right atrium appears to be enlarged. Cannot exclude thrombus in the right pulmonary veins. The abdominal aorta is patent without aneurysm or dissection.  Celiac axis: Celiac trunk and main branch vessels are patent, including the splenic artery.  Superior mesenteric: The superior mesenteric artery is patent. Main branch vessels are patent.  Left renal: Occlusion of the left renal artery just beyond the origin. Minimal blood flow to the left kidney even on the delayed images.  Right renal:         Right renal artery is patent.  Inferior mesenteric: Patent.  Left iliac: Left iliac arteries are patent. The proximal left femoral arteries are patent.  Right iliac: Right iliac arteries are patent. The proximal right femoral arteries are patent.  Venous findings: IVC and renal veins are patent. Portal venous system is patent.  Review of the MIP images confirms the above findings.  NONVASCULAR FINDINGS:  There is a small-moderate sized right pleural effusion. Few patchy densities in the right middle lobe may represent atelectasis. No gross abnormality to the liver. There is a small amount of perihepatic ascites and fluid around the gallbladder. Suspect a small amount of periportal edema. No gross abnormality to the spleen or adrenal glands. No gross abnormality to the pancreas. Suspect scarring along the right kidney lower pole. The left kidney is edematous with minimal arterial flow. Findings compatible  with acute vascular occlusion of the left renal artery with left kidney ischemia.  Some of the images are limited by motion artifact. No gross abnormality to the stomach or duodenum. Small amount of free fluid  in the pelvis. Uterus is absent. No gross abnormality to the urinary bladder. No gross abnormality to the small or large bowel.  No acute bone abnormality.  IMPRESSION: Large filling defect in the left atrium is suggestive for thrombus. An underlying lesion cannot be excluded. This may be better characterized with an echocardiogram.  Occlusion of the proximal left renal artery with no significant flow to the left kidney. 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 Findings compatible with thromboembolic disease originating from the left atrium.  Small amount of fluid in the right abdomen and pelvis.  Critical Value/emergent results were called by telephone at the time of interpretation on 09/03/2014 at 8:46 pm to Dr. Benjiman Core , who verbally acknowledged these results.   Electronically Signed   By: Richarda Overlie M.D.   On: 09/03/2014 21:05    Scheduled Meds: . cefTRIAXone (ROCEPHIN)  IV  1 g Intravenous Q24H  . mometasone-formoterol  2 puff Inhalation BID  . pantoprazole  40 mg Oral Daily  . pneumococcal 23 valent vaccine  0.5 mL Intramuscular Tomorrow-1000  . pravastatin  40 mg Oral q1800  . sodium chloride  3 mL Intravenous Q12H   Continuous Infusions: . sodium chloride 125 mL/hr at 09/04/14 1913  . diltiazem (CARDIZEM) infusion 15 mg/hr (09/05/14 0907)  . heparin 1,500 Units/hr (09/05/14 1027)    Principal Problem:   Atrial fibrillation with rapid ventricular response Active Problems:   Renal infarction   Hyperkalemia   Left atrial thrombus   Acute renal artery occlusion    Time spent: 30 min    Cameryn Schum, [facility]  [facility] Pager [phone]. If 7PM-7AM, please contact night-coverage at [email or web address], password [facility] 09/05/2014,  10:36 AM  LOS: 2 days

## 2014-09-05 NOTE — Progress Notes (Signed)
ANTICOAGULATION CONSULT NOTE Pharmacy Consult for Heparin  Indication: atrial clot/infarcted kidney  Allergies  Allergen Reactions  . Codeine Itching    Patient Measurements: Height:  (149.9 cm) Weight: 241 lb (109.317 kg) IBW/kg (Calculated) : 43.2  Vital Signs: Temp: 98.5 F (36.9 C) (08/23 2000) Temp Source: Oral (08/23 2000) BP: 118/75 mmHg (08/23 2313) Pulse Rate: 69 (08/23 2313)  Labs:  Recent Labs  09/03/14 1600 09/03/14 1740 09/04/14 0410 09/04/14 0520 09/04/14 1420 09/05/14 0010  HGB 13.4  --   --  13.5  --   --   HCT 40.1  --   --  41.0  --   --   PLT 219  --   --  226  --   --   APTT 25  --   --   --   --   --   LABPROT 16.6*  --   --   --   --   --   INR 1.33  --   --   --   --   --   HEPARINUNFRC  --   --   --  0.16* 0.29* 0.29*  CREATININE 1.22* 1.25* 1.49*  --   --   --     Estimated Creatinine Clearance: 45.2 mL/min (by C-G formula based on Cr of 1.49).  Assessment: 59 yo female with Afib for heparin Goal of Therapy:  Heparin level 0.3-0.7 units/ml Monitor platelets by anticoagulation protocol: Yes   Plan:  Increase Heparin 1400 units/hr Follow-up am labs.  Geannie Risen, PharmD, BCPS   09/05/2014 1:10 AM

## 2014-09-05 NOTE — Progress Notes (Signed)
ANTICOAGULATION CONSULT NOTE - Follow Up Consult  Pharmacy Consult for Heparin  Indication: atrial clot/infarcted kidney  Allergies  Allergen Reactions  . Codeine Itching    Patient Measurements: Height:  (149.9 cm) Weight: 260 lb 4.8 oz (118.071 kg) IBW/kg (Calculated) : 43.2  Heparin dosing weight 70 kg  Vital Signs: Temp: 98.3 F (36.8 C) (08/24 1112) Temp Source: Oral (08/24 1112) BP: 134/89 mmHg (08/24 1112) Pulse Rate: 75 (08/24 1112)  Labs:  Recent Labs  09/03/14 1600 09/03/14 1740 09/04/14 0410 09/04/14 0520  09/05/14 0010 09/05/14 0350 09/05/14 1650  HGB 13.4  --   --  13.5  --   --  13.3  --   HCT 40.1  --   --  41.0  --   --  40.3  --   PLT 219  --   --  226  --   --  226  --   APTT 25  --   --   --   --   --   --   --   LABPROT 16.6*  --   --   --   --   --   --   --   INR 1.33  --   --   --   --   --   --   --   HEPARINUNFRC  --   --   --  0.16*  < > 0.29* 0.29* 0.15*  CREATININE 1.22* 1.25* 1.49*  --   --   --  1.91*  --   < > = values in this interval not displayed.  Estimated Creatinine Clearance: 37.1 mL/min (by C-G formula based on Cr of 1.91).  Assessment: 58 yof continues on IV heparin for anticoagulation of atrial clot and kidney infarct. Heparin level remains low despite rate adjustments. No bleeding or other problems noted. Of note, AT3 levels are also low, which could potentially increase the likelihood for low HL.   Current HL is 0.15 with no interruptions of drip per RN  Goal of Therapy:  Heparin level 0.3-0.7 units/ml Monitor platelets by anticoagulation protocol: Yes   Plan:  - Heparin bolus 2000 units IV x 1 then increase rate to 1750 units/hr - Check a 6 hour heparin level - Continue daily heparin level and CBC - F/u plans for oral anticoagulation  Isaac Bliss, PharmD, BCPS Clinical Pharmacist Pager [phone] 09/05/2014 6:12 PM

## 2014-09-06 ENCOUNTER — Encounter (HOSPITAL_COMMUNITY): Payer: Self-pay | Admitting: Thoracic Surgery (Cardiothoracic Vascular Surgery)

## 2014-09-06 ENCOUNTER — Other Ambulatory Visit: Payer: Self-pay | Admitting: *Deleted

## 2014-09-06 ENCOUNTER — Inpatient Hospital Stay (HOSPITAL_COMMUNITY): Payer: Commercial Managed Care - HMO

## 2014-09-06 DIAGNOSIS — R600 Localized edema: Secondary | ICD-10-CM | POA: Diagnosis present

## 2014-09-06 DIAGNOSIS — I050 Rheumatic mitral stenosis: Secondary | ICD-10-CM

## 2014-09-06 DIAGNOSIS — N3000 Acute cystitis without hematuria: Secondary | ICD-10-CM | POA: Insufficient documentation

## 2014-09-06 DIAGNOSIS — I060 Rheumatic aortic stenosis: Secondary | ICD-10-CM

## 2014-09-06 DIAGNOSIS — I5032 Chronic diastolic (congestive) heart failure: Secondary | ICD-10-CM | POA: Diagnosis present

## 2014-09-06 LAB — RENAL FUNCTION PANEL
ANION GAP: 10 (ref 5–15)
Albumin: 2.4 g/dL — ABNORMAL LOW (ref 3.5–5.0)
BUN: 29 mg/dL — ABNORMAL HIGH (ref 6–20)
CHLORIDE: 99 mmol/L — AB (ref 101–111)
CO2: 19 mmol/L — AB (ref 22–32)
CREATININE: 2.28 mg/dL — AB (ref 0.44–1.00)
Calcium: 8.5 mg/dL — ABNORMAL LOW (ref 8.9–10.3)
GFR, EST AFRICAN AMERICAN: 26 mL/min — AB (ref >60)
GFR, EST NON AFRICAN AMERICAN: 22 mL/min — AB (ref >60)
Glucose, Bld: 108 mg/dL — ABNORMAL HIGH (ref 65–99)
POTASSIUM: 4.9 mmol/L (ref 3.5–5.1)
Phosphorus: 4 mg/dL (ref 2.5–4.6)
Sodium: 128 mmol/L — ABNORMAL LOW (ref 135–145)

## 2014-09-06 LAB — URINE CULTURE: CULTURE: NO GROWTH

## 2014-09-06 LAB — HEPARIN LEVEL (UNFRACTIONATED)
HEPARIN UNFRACTIONATED: 0.45 [IU]/mL (ref 0.30–0.70)
Heparin Unfractionated: 0.24 IU/mL — ABNORMAL LOW (ref 0.30–0.70)
Heparin Unfractionated: 0.16 IU/mL — ABNORMAL LOW (ref 0.30–0.70)

## 2014-09-06 LAB — CBC
HEMATOCRIT: 37 % (ref 36.0–46.0)
HEMOGLOBIN: 12.5 g/dL (ref 12.0–15.0)
MCH: 29.5 pg (ref 26.0–34.0)
MCHC: 33.8 g/dL (ref 30.0–36.0)
MCV: 87.3 fL (ref 78.0–100.0)
PLATELETS: 237 10*3/uL (ref 150–400)
RBC: 4.24 MIL/uL (ref 3.87–5.11)
RDW: 15.4 % (ref 11.5–15.5)
WBC: 25.2 10*3/uL — AB (ref 4.0–10.5)

## 2014-09-06 LAB — HOMOCYSTEINE: HOMOCYSTEINE-NORM: 13.4 umol/L (ref 0.0–15.0)

## 2014-09-06 MED ORDER — DOCUSATE SODIUM 100 MG PO CAPS
100.0000 mg | ORAL_CAPSULE | Freq: Two times a day (BID) | ORAL | Status: DC
  Administered 2014-09-06 – 2014-09-18 (×20): 100 mg via ORAL
  Filled 2014-09-06 (×22): qty 1

## 2014-09-06 MED ORDER — SENNA 8.6 MG PO TABS
2.0000 | ORAL_TABLET | Freq: Every day | ORAL | Status: DC
  Administered 2014-09-06 – 2014-09-17 (×10): 17.2 mg via ORAL
  Filled 2014-09-06 (×11): qty 2

## 2014-09-06 MED ORDER — METOPROLOL TARTRATE 25 MG PO TABS
25.0000 mg | ORAL_TABLET | Freq: Two times a day (BID) | ORAL | Status: DC
  Administered 2014-09-06 – 2014-09-10 (×8): 25 mg via ORAL
  Filled 2014-09-06 (×8): qty 1

## 2014-09-06 MED ORDER — METOPROLOL TARTRATE 12.5 MG HALF TABLET
12.5000 mg | ORAL_TABLET | Freq: Once | ORAL | Status: AC
  Administered 2014-09-06: 12.5 mg via ORAL
  Filled 2014-09-06: qty 1

## 2014-09-06 MED ORDER — LACTULOSE 10 GM/15ML PO SOLN: 30.0000 g | Freq: Every day | ORAL | Status: DC | PRN

## 2014-09-06 MED ORDER — DILTIAZEM HCL 60 MG PO TABS
90.0000 mg | ORAL_TABLET | Freq: Three times a day (TID) | ORAL | Status: DC
  Administered 2014-09-06 – 2014-09-15 (×27): 90 mg via ORAL
  Filled 2014-09-06 (×54): qty 1

## 2014-09-06 NOTE — Evaluation (Signed)
Occupational Therapy Evaluation Patient Details Name: [name] [name] MRN: [id number] DOB: 08/17/1955 Today's Date: 09/06/2014    History of Present Illness   [name] [name] is a 59 y.o. female with past medical history significant for asthma, hypercholesterolemia, valvular disease and obesity. She presented to the emergency department on 8/22 with complaints of a productive cough and also severe left sided abdominal flank and back pain for 3 days. She was noted to be in atrial fibrillation at the time and that was new for her. She underwent a CT of the abdomen with contrast and that was notable for an atrial thrombus and what was felt to be an acute occlusion of the left renal artery with minimal to no flow to the left kidney.   During hospitalization her HR has been difficult to control with symptoms felt to be related to severe mitral stenosis.  It is felt that she will ultimately need a mitral valve repair/replacement.      Clinical Impression   Pt admitted with above. She demonstrates the below listed deficits and will benefit from continued OT to maximize safety and independence with BADLs.  Pt presents with generalized weakness.  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  Currently, she requires mod A overall for ADLs and min - mod A for functional mobility.   She requires multiple rest breaks during eval.  HR 122; 02 sats 95% on RA, and DOE 3/4.   Anticipate she will likely require post acute rehab at discharge (after surgery), possibly CIR depending on progress.   Will follow acutely       Follow Up Recommendations  CIR;Supervision/Assistance - 24 hour    Equipment Recommendations  3 in 1 bedside comode;Tub/shower bench    Recommendations for Other Services Rehab consult     Precautions / Restrictions Precautions Precautions: Fall Restrictions Weight Bearing Restrictions: No      Mobility Bed Mobility               General bed mobility  comments: pt was sitting up in chair   Transfers Overall transfer level: Needs assistance Equipment used: Rolling walker (2 wheeled) Transfers: Sit to/from [employer] Sit to Stand: Min assist;Mod assist Stand pivot transfers: Min assist       General transfer comment: min A to move sit to stand from recliner and mod  A from commode     Balance Overall balance assessment: Needs assistance Sitting-balance support: Feet supported Sitting balance-Leahy Scale: Good     Standing balance support: During functional activity;Single extremity supported Standing balance-Leahy Scale: Poor Standing balance comment: requires min A                            ADL Overall ADL's : Needs assistance/impaired Eating/Feeding: Independent;Sitting   Grooming: Wash/dry hands;Wash/dry face;Oral care;Minimal assistance;Sitting   Upper Body Bathing: Minimal assitance;Sitting   Lower Body Bathing: Moderate assistance;Sit to/from stand   Upper Body Dressing : Minimal assistance;Sitting   Lower Body Dressing: Moderate assistance;Sit to/from stand   Toilet Transfer: Moderate assistance;Ambulation;Comfort height toilet;RW;Grab bars Toilet Transfer Details (indicate cue type and reason): requires mod A to move into standing  Toileting- Clothing Manipulation and Hygiene: Minimal assistance;Sit to/from stand       Functional mobility during ADLs: Minimal assistance;Rolling walker General ADL Comments: Pt fatigues and requires rest breaks, but is very motivated      Nurse, adult  Praxis      Pertinent Vitals/Pain Pain Assessment: No/denies pain     Hand Dominance Right   Extremity/Trunk Assessment Upper Extremity Assessment Upper Extremity Assessment: Generalized weakness   Lower Extremity Assessment Lower Extremity Assessment: Defer to PT evaluation       Communication Communication Communication: No difficulties   Cognition  Arousal/Alertness: Awake/alert Behavior During Therapy: WFL for tasks assessed/performed Overall Cognitive Status: Within Functional Limits for tasks assessed                     General Comments       Exercises       Shoulder Instructions      Home Living Family/patient expects to be discharged to:: Private residence Living Arrangements: Children;Spouse/significant other (daughter) Available Help at Discharge: Family;Available PRN/intermittently Type of Home: House Home Access: Stairs to enter [employer] of Steps: 2   Home Layout: One level     Bathroom Shower/Tub: Tub/shower unit;Curtain Shower/tub characteristics: Engineer, building services: Standard     Home Equipment: None   Additional Comments: spouse and daughter work during the day       Prior Functioning/Environment Level of Independence: Independent        Comments: Pt was fully independent.  Worked as a custodian full time     OT Diagnosis: Generalized weakness   OT Problem List: Decreased strength;Decreased activity tolerance;Impaired balance (sitting and/or standing);Decreased safety awareness;Decreased knowledge of use of DME or AE;Decreased knowledge of precautions;Cardiopulmonary status limiting activity;Obesity   OT Treatment/Interventions: Self-care/ADL training;DME and/or AE instruction;Therapeutic activities;Patient/family education;Balance training;Therapeutic exercise    OT Goals(Current goals can be found in the care plan section) Acute Rehab OT Goals Patient Stated Goal: to regain strength and inependence  OT Goal Formulation: With patient Time For Goal Achievement: 09/20/14 Potential to Achieve Goals: Good ADL Goals Pt Will Perform Grooming: with min guard assist;standing Pt Will Perform Upper Body Bathing: with set-up;sitting Pt Will Perform Lower Body Bathing: with min guard assist;sit to/from stand Pt Will Perform Upper Body Dressing: with set-up;sitting Pt Will  Perform Lower Body Dressing: with min guard assist;sit to/from stand Pt Will Transfer to Toilet: with min guard assist;ambulating;bedside commode;grab bars;regular height toilet Pt Will Perform Toileting - Clothing Manipulation and hygiene: with min guard assist;sit to/from stand Pt/caregiver will Perform Home Exercise Program: Increased strength;Both right and left upper extremity;With theraband;With written HEP provided;Independently  OT Frequency: Min 2X/week   Barriers to D/C: Decreased caregiver support          Co-evaluation PT/OT/SLP Co-Evaluation/Treatment: Yes (OT/PT overlapped) Reason for Co-Treatment: For patient/therapist safety   OT goals addressed during session: ADL's and self-care      End of Session Equipment Utilized During Treatment: Rolling walker Nurse Communication: Mobility status  Activity Tolerance: Patient limited by fatigue Patient left: in chair;with call bell/phone within reach;with family/visitor present   Time: 1610-9604 OT Time Calculation (min): 49 min Charges:  OT General Charges $OT Visit: 1 Procedure OT Evaluation $Initial OT Evaluation Tier I: 1 Procedure OT Treatments $Self Care/Home Management : 8-22 mins G-Codes:    Kinlie Janice M 2014/10/06, 3:42 PM

## 2014-09-06 NOTE — Interval H&P Note (Signed)
Cath Lab Visit (complete for each Cath Lab visit)  Clinical Evaluation Leading to the Procedure:   ACS: No.  Non-ACS:    Anginal Classification: No Symptoms  Anti-ischemic medical therapy: Maximal Therapy (2 or more classes of medications)  Non-Invasive Test Results: No non-invasive testing performed  Prior CABG: No previous CABG      History and Physical Interval Note:  09/06/2014 6:51 PM  The clinical data has been reviewed including the nephrology recommendations. Will speak with Dr. Royann Shivers and will likely delay angiography until felt safe.  [name] [name]  has presented today for surgery, with the diagnosis of mitrial stenosis  The various methods of treatment have been discussed with the patient and family. After consideration of risks, benefits and other options for treatment, the patient has consented to  Procedure(s): Right/Left Heart Cath and Coronary Angiography (N/A) as a surgical intervention .  The patient's history has been reviewed, patient examined, no change in status, stable for surgery.  I have reviewed the patient's chart and labs.  Questions were answered to the patient's satisfaction.     Lesleigh Noe

## 2014-09-06 NOTE — Progress Notes (Addendum)
[facility] BRIEF CONSULT NOTE   Patient seen and examined, chart, ECHO and CT reviewed.  Await results of cath.  Full consult to follow.  This patient has reportedly been very lethargic all day.  Although there are no focal findings on exam, given her clinical presentation she needs MRI of brain to r/o stroke.  This probably should be done prior to cath.  Purcell Nails 09/06/2014 7:00 PM

## 2014-09-06 NOTE — H&P (View-Only) (Signed)
[facility] Renal Consultation Note  Requesting MD: Short Indication for Consultation: elevated creatinine- acute thrombosis of left renal artery  HPI:  [name] [name] is a 59 y.o. female  with past medical history significant for asthma, hypercholesterolemia, valvular disease and obesity. She presented to the emergency department on 8/22 with complaints of a productive cough and also severe left sided abdominal flank and back pain for 3 days. She was noted to be in atrial fibrillation at the time and that was new for her. She underwent a CT of the abdomen with contrast and that was notable for an atrial thrombus and what was felt to be an acute occlusion of the left renal artery with minimal to no flow to the left kidney. Therefore, an acute left renal infarct. She was admitted and placed on heparin as well as diltiazem.  She has not thrived very well, her heart rate has been difficult to control she is having symptoms that are felt to be related to her severe mitral stenosis.  It is felt that she will ultimately need a mitral valve repair / replacement with a clot extraction plus or minus aortic valve replacement. They want to do cardiac catheterization prior to that surgery. The issue is that her creatinine has been steadily worsening day by day. Her baseline creatinine is 0.9. He was noted to be 1.22 time of admission and has steadily worsened to a level of 2.28 today. Her urine output has been adequate at around a liter. Her blood pressure has been lowish. Urine obtained yesterday is very remarkable for hematuria and proteinuria as well as pyuria, is on ceftriaxone. First urine culture just showed multiple species the second is pending.  Patient denies any dysuria. She has a limited understanding of what is going on. She's falling asleep while I'm talking to her.   CREATININE, SER  Date/Time Value Ref Range Status  09/06/2014 07:12 AM 2.28* 0.44 - 1.00 mg/dL Final  09/05/2014 03:50  AM 1.91* 0.44 - 1.00 mg/dL Final  09/04/2014 04:10 AM 1.49* 0.44 - 1.00 mg/dL Final  09/03/2014 05:40 PM 1.25* 0.44 - 1.00 mg/dL Final  09/03/2014 04:00 PM 1.22* 0.44 - 1.00 mg/dL Final  05/19/2014 10:53 PM 0.90 0.44 - 1.00 mg/dL Final  03/20/2012 12:30 PM 0.70 0.50 - 1.10 mg/dL Final     PMHx:   Past Medical History  Diagnosis Date  . Asthma   . Obesity   . Hypercholesteremia   . Heart murmur   . Atrial fibrillation with rapid ventricular response 09/03/2014  . Bifascicular block 09/12/2013  . 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 Pure hypercholesterolemia 09/12/2013  . Severe obesity (BMI >= 40) 09/12/2013  . 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 Chronic diastolic heart failure     Past Surgical History  Procedure Laterality Date  . Abdominal hysterectomy    . Knee arthroscopy      Family Hx:  Family History  Problem Relation Age of Onset  . Stroke Mother   . Prostate cancer Father     Social History:  reports that she has never smoked. She has never used smokeless tobacco. She reports that she drinks alcohol. She reports that she does not use illicit drugs.  Allergies:  Allergies  Allergen Reactions  . Codeine Itching    Medications: Prior to Admission medications   Medication Sig Start Date End Date Taking? Authorizing Provider  b complex vitamins capsule Take 1 capsule by mouth daily.  Yes Historical Provider, MD  benzonatate (TESSALON) 200 MG capsule Take 1 capsule (200 mg total) by mouth 3 (three) times daily as needed for cough. 08/31/14  Yes Juanito Doom, MD  cholecalciferol (VITAMIN D) 1000 UNITS tablet Take 2,000 Units by mouth daily.    Yes Historical Provider, MD  Coenzyme Q10 (COQ-10) 10 MG CAPS Take 1 capsule by mouth daily.   Yes Historical Provider, MD  mometasone-formoterol (DULERA) 100-5 MCG/ACT AERO Inhale 2 puffs into the lungs as needed for wheezing.   Yes Historical Provider, MD  OMEGA-3 FATTY ACIDS PO  Take 1,600 mg by mouth every Monday, Wednesday, and Friday.    Yes Historical Provider, MD  pantoprazole (PROTONIX) 40 MG tablet Take 1 tablet (40 mg total) by mouth daily. Take 30-60 min before first meal of the day 08/28/14  Yes Tanda Rockers, MD  pravastatin (PRAVACHOL) 40 MG tablet Take 40 mg by mouth daily.  09/11/13  Yes Historical Provider, MD  predniSONE (DELTASONE) 20 MG tablet Take 20 mg by mouth 2 (two) times daily with a meal.  08/27/14  Yes Historical Provider, MD  Respiratory Therapy Supplies (FLUTTER) DEVI Use as directed 08/28/14   Tanda Rockers, MD    I have reviewed the patient's current medications.  Labs:  Results for orders placed or performed during the hospital encounter of 09/03/14 (from the past 48 hour(s))  Heparin level (unfractionated)     Status: Abnormal   Collection Time: 09/04/14  2:20 PM  Result Value Ref Range   Heparin Unfractionated 0.29 (L) 0.30 - 0.70 IU/mL    Comment:        IF HEPARIN RESULTS ARE BELOW EXPECTED VALUES, AND PATIENT DOSAGE HAS BEEN CONFIRMED, SUGGEST FOLLOW UP TESTING OF ANTITHROMBIN III LEVELS.   TSH     Status: None   Collection Time: 09/04/14  2:20 PM  Result Value Ref Range   TSH 1.978 0.350 - 4.500 uIU/mL  Heparin level (unfractionated)     Status: Abnormal   Collection Time: 09/05/14 12:10 AM  Result Value Ref Range   Heparin Unfractionated 0.29 (L) 0.30 - 0.70 IU/mL    Comment:        IF HEPARIN RESULTS ARE BELOW EXPECTED VALUES, AND PATIENT DOSAGE HAS BEEN CONFIRMED, SUGGEST FOLLOW UP TESTING OF ANTITHROMBIN III LEVELS.   Heparin level (unfractionated)     Status: Abnormal   Collection Time: 09/05/14  3:50 AM  Result Value Ref Range   Heparin Unfractionated 0.29 (L) 0.30 - 0.70 IU/mL    Comment:        IF HEPARIN RESULTS ARE BELOW EXPECTED VALUES, AND PATIENT DOSAGE HAS BEEN CONFIRMED, SUGGEST FOLLOW UP TESTING OF ANTITHROMBIN III LEVELS.   CBC     Status: Abnormal   Collection Time: 09/05/14  3:50 AM   Result Value Ref Range   WBC 26.0 (H) 4.0 - 10.5 K/uL   RBC 4.50 3.87 - 5.11 MIL/uL   Hemoglobin 13.3 12.0 - 15.0 g/dL   HCT 40.3 36.0 - 46.0 %   MCV 89.6 78.0 - 100.0 fL   MCH 29.6 26.0 - 34.0 pg   MCHC 33.0 30.0 - 36.0 g/dL   RDW 15.4 11.5 - 15.5 %   Platelets 226 150 - 400 K/uL  Basic metabolic panel     Status: Abnormal   Collection Time: 09/05/14  3:50 AM  Result Value Ref Range   Sodium 132 (L) 135 - 145 mmol/L   Potassium 4.9 3.5 - 5.1 mmol/L  Chloride 104 101 - 111 mmol/L   CO2 17 (L) 22 - 32 mmol/L   Glucose, Bld 123 (H) 65 - 99 mg/dL   BUN 24 (H) 6 - 20 mg/dL   Creatinine, Ser 1.91 (H) 0.44 - 1.00 mg/dL   Calcium 8.5 (L) 8.9 - 10.3 mg/dL   GFR calc non Af Amer 28 (L) >60 mL/min   GFR calc Af Amer 32 (L) >60 mL/min    Comment: (NOTE) The eGFR has been calculated using the CKD EPI equation. This calculation has not been validated in all clinical situations. eGFR's persistently <60 mL/min signify possible Chronic Kidney Disease.    Anion gap 11 5 - 15  Urinalysis, Routine w reflex microscopic (not at [facility])     Status: Abnormal   Collection Time: 09/05/14  9:59 AM  Result Value Ref Range   Color, Urine RED (A) YELLOW    Comment: BIOCHEMICALS MAY BE AFFECTED BY COLOR   APPearance TURBID (A) CLEAR   Specific Gravity, Urine 1.042 (H) 1.005 - 1.030   pH 5.5 5.0 - 8.0   Glucose, UA NEGATIVE NEGATIVE mg/dL   Hgb urine dipstick LARGE (A) NEGATIVE   Bilirubin Urine LARGE (A) NEGATIVE   Ketones, ur 15 (A) NEGATIVE mg/dL   Protein, ur >300 (A) NEGATIVE mg/dL   Urobilinogen, UA 1.0 0.0 - 1.0 mg/dL   Nitrite POSITIVE (A) NEGATIVE   Leukocytes, UA MODERATE (A) NEGATIVE  Urine microscopic-add on     Status: Abnormal   Collection Time: 09/05/14  9:59 AM  Result Value Ref Range   Squamous Epithelial / LPF FEW (A) RARE   WBC, UA 21-50 <3 WBC/hpf   RBC / HPF 11-20 <3 RBC/hpf   Bacteria, UA MANY (A) RARE   Urine-Other AMORPHOUS URATES/PHOSPHATES   Sodium, urine, random      Status: None   Collection Time: 09/05/14  9:59 AM  Result Value Ref Range   Sodium, Ur <10 mmol/L  Creatinine, urine, random     Status: None   Collection Time: 09/05/14  9:59 AM  Result Value Ref Range   Creatinine, Urine 181.35 mg/dL  Urine culture     Status: None (Preliminary result)   Collection Time: 09/05/14  9:59 AM  Result Value Ref Range   Specimen Description URINE, RANDOM    Special Requests ADDED [id number]    Culture NO GROWTH < 24 HOURS    Report Status PENDING   Antithrombin III     Status: Abnormal   Collection Time: 09/05/14 12:52 PM  Result Value Ref Range   AntiThromb III Func 63 (L) 75 - 120 %  Homocysteine, serum     Status: None   Collection Time: 09/05/14 12:52 PM  Result Value Ref Range   Homocysteine 13.4 0.0 - 15.0 umol/L    Comment: (NOTE) Performed At: [facility] [place], [place] [id number] Lindon Romp MD GY:[id number]   Heparin level (unfractionated)     Status: Abnormal   Collection Time: 09/05/14  4:50 PM  Result Value Ref Range   Heparin Unfractionated 0.15 (L) 0.30 - 0.70 IU/mL    Comment:        IF HEPARIN RESULTS ARE BELOW EXPECTED VALUES, AND PATIENT DOSAGE HAS BEEN CONFIRMED, SUGGEST FOLLOW UP TESTING OF ANTITHROMBIN III LEVELS.   Heparin level (unfractionated)     Status: None   Collection Time: 09/06/14 12:50 AM  Result Value Ref Range   Heparin Unfractionated 0.45 0.30 - 0.70 IU/mL  Comment:        IF HEPARIN RESULTS ARE BELOW EXPECTED VALUES, AND PATIENT DOSAGE HAS BEEN CONFIRMED, SUGGEST FOLLOW UP TESTING OF ANTITHROMBIN III LEVELS.   Heparin level (unfractionated)     Status: Abnormal   Collection Time: 09/06/14  7:12 AM  Result Value Ref Range   Heparin Unfractionated 0.24 (L) 0.30 - 0.70 IU/mL    Comment:        IF HEPARIN RESULTS ARE BELOW EXPECTED VALUES, AND PATIENT DOSAGE HAS BEEN CONFIRMED, SUGGEST FOLLOW UP TESTING OF ANTITHROMBIN III LEVELS.   CBC     Status: Abnormal    Collection Time: 09/06/14  7:12 AM  Result Value Ref Range   WBC 25.2 (H) 4.0 - 10.5 K/uL   RBC 4.24 3.87 - 5.11 MIL/uL   Hemoglobin 12.5 12.0 - 15.0 g/dL   HCT 37.0 36.0 - 46.0 %   MCV 87.3 78.0 - 100.0 fL   MCH 29.5 26.0 - 34.0 pg   MCHC 33.8 30.0 - 36.0 g/dL   RDW 15.4 11.5 - 15.5 %   Platelets 237 150 - 400 K/uL  Renal function panel     Status: Abnormal   Collection Time: 09/06/14  7:12 AM  Result Value Ref Range   Sodium 128 (L) 135 - 145 mmol/L   Potassium 4.9 3.5 - 5.1 mmol/L   Chloride 99 (L) 101 - 111 mmol/L   CO2 19 (L) 22 - 32 mmol/L   Glucose, Bld 108 (H) 65 - 99 mg/dL   BUN 29 (H) 6 - 20 mg/dL   Creatinine, Ser 2.28 (H) 0.44 - 1.00 mg/dL   Calcium 8.5 (L) 8.9 - 10.3 mg/dL   Phosphorus 4.0 2.5 - 4.6 mg/dL   Albumin 2.4 (L) 3.5 - 5.0 g/dL   GFR calc non Af Amer 22 (L) >60 mL/min   GFR calc Af Amer 26 (L) >60 mL/min    Comment: (NOTE) The eGFR has been calculated using the CKD EPI equation. This calculation has not been validated in all clinical situations. eGFR's persistently <60 mL/min signify possible Chronic Kidney Disease.    Anion gap 10 5 - 15     ROS:  A comprehensive review of systems was negative except for: Respiratory: positive for dyspnea on exertion but she says is slightly better than previously   Physical Exam: Filed Vitals:   09/06/14 0712  BP: 108/60  Pulse: 72  Temp:   Resp: 22     General:  obese. 1-2 word answers. Falling asleep - does also seem to be short of breath with increased respiratory rate  HEENT: pupils are equal round reactive to light, extra motions are intact, mucous membranes are moist  Neck: there is jugular venous distention  Heart: irregular  Lungs: poor effort. 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 She has suffered acute kidney injury  1.Renal- acute kidney injury. Mostly likely secondary to this acute left renal infarct. However, patient also underwent a CT scan with contrast early on and has had atrial fibrillation with low blood pressures which could be impacting kidney function as well. Her urinalysis is somewhat uninterpretable in the setting. She has suggestion of a urinary tract infection and is on ceftriaxone. She is making a reasonable amount of urine. I feel like with supportive care  her renal function should improve eventually ,  however, some of these other issues are hampering recovery right now. I definitely would not proceed with cardiac catheterization with renal function worsening daily as that will hamper recovery even more. I do understand that she needs a cardiac catheterization but would wait for things to "cool off" before proceeding. 2. Hypertension/volume  - blood pressure is lowish which could be affecting kidney perfusion. Unfortunately, she requires Cardizem and metoprolol at this time to keep her rate controlled. She has received intermittent doses of Lasix. Is very difficult to determine her volume status at this time given her obesity. I did not does with any additional Lasix today.  3.Anemia-  Fortunately does not appear to be an issue at this time 4. Possible UTI- second culture is pending and she is on ceftriaxone    Becky Colan A 09/06/2014, 12:41 PM

## 2014-09-06 NOTE — Progress Notes (Signed)
ANTICOAGULATION CONSULT NOTE - Follow Up Consult  Pharmacy Consult for Heparin  Indication: atrial clot/infarcted kidney  Allergies  Allergen Reactions  . Codeine Itching    Patient Measurements: Height:  (149.9 cm) Weight: 260 lb 4.8 oz (118.071 kg) IBW/kg (Calculated) : 43.2  Heparin dosing weight 70 kg  Vital Signs: Temp: 98.3 F (36.8 C) (08/24 2000) Temp Source: Oral (08/24 2000) BP: 115/75 mmHg (08/24 2240) Pulse Rate: 93 (08/24 2240)  Labs:  Recent Labs  09/03/14 1600 09/03/14 1740 09/04/14 0410 09/04/14 0520  09/05/14 0350 09/05/14 1650 09/06/14 0050  HGB 13.4  --   --  13.5  --  13.3  --   --   HCT 40.1  --   --  41.0  --  40.3  --   --   PLT 219  --   --  226  --  226  --   --   APTT 25  --   --   --   --   --   --   --   LABPROT 16.6*  --   --   --   --   --   --   --   INR 1.33  --   --   --   --   --   --   --   HEPARINUNFRC  --   --   --  0.16*  < > 0.29* 0.15* 0.45  CREATININE 1.22* 1.25* 1.49*  --   --  1.91*  --   --   < > = values in this interval not displayed.  Estimated Creatinine Clearance: 37.1 mL/min (by C-G formula based on Cr of 1.91).  Assessment: 58 yof continues on IV heparin for anticoagulation of atrial clot and kidney infarct. Heparin level now therapeutic at 0.45 on 1750 units/hr. No bleeding noted.   Goal of Therapy:  Heparin level 0.3-0.7 units/ml Monitor platelets by anticoagulation protocol: Yes   Plan:  - Continue heparin at 1750 units/hr - Continue daily heparin level and CBC  Christoper Fabian, PharmD, BCPS Clinical pharmacist, pager [phone] 09/06/2014 1:28 AM

## 2014-09-06 NOTE — Progress Notes (Signed)
Dear Doctor: Short This patient has been identified as a candidate for PICC for the following reason (s): IV therapy over 48 hours and poor veins/poor circulatory system (CHF, COPD, emphysema, diabetes, steroid use, IV drug abuse, etc.) If you agree, please write an order for the indicated device. For any questions contact the Vascular Access Team at [phone] if no answer, please leave a message.  Thank you for supporting the early vascular access assessment program. 

## 2014-09-06 NOTE — Evaluation (Signed)
Physical Therapy Evaluation Patient Details Name: [name] [name] MRN: [id number] DOB: 04-Mar-1955 Today's Date: 09/06/2014   History of Present Illness    [name] [name] is a 59 y.o. female with past medical history significant for asthma, hypercholesterolemia, valvular disease and obesity. She presented to the emergency department on 8/22 with complaints of a productive cough and also severe left sided abdominal flank and back pain for 3 days. She was noted to be in atrial fibrillation at the time and that was new for her. She underwent a CT of the abdomen with contrast and that was notable for an atrial thrombus and what was felt to be an acute occlusion of the left renal artery with minimal to no flow to the left kidney.   During hospitalization her HR has been difficult to control with symptoms felt to be related to severe mitral stenosis.  It is felt that she will ultimately need a mitral valve repair/replacement.     Clinical Impression  Pt admitted with above diagnosis and presents to PT with functional limitations due to deficits listed below (See PT problem list). Pt needs skilled PT to maximize independence and safety prior to dc. Pt may have surgery. Expect she will need post acute rehab (possibly CIR) prior to return home. Prior to admission pt working as Arboriculturist.     Follow Up Recommendations CIR    Equipment Recommendations  Rolling walker with 5" wheels    Recommendations for Other Services       Precautions / Restrictions Precautions Precautions: Fall Restrictions Weight Bearing Restrictions: No      Mobility  Bed Mobility               General bed mobility comments: pt was sitting up in chair   Transfers Overall transfer level: Needs assistance Equipment used: Rolling walker (2 wheeled) Transfers: Sit to/from Stand Sit to Stand: Min assist;Mod assist Stand pivot transfers: Min assist       General transfer comment: min A to move sit to stand  from recliner and mod  A from commode   Ambulation/Gait Ambulation/Gait assistance: Min assist;+2 safety/equipment (followed with recliner) Ambulation Distance (Feet): 40 Feet Assistive device: Rolling walker (2 wheeled) Gait Pattern/deviations: Step-through pattern;Decreased step length - right;Decreased step length - left;Shuffle;Trunk flexed Gait velocity: decr Gait velocity interpretation: Below normal speed for age/gender General Gait Details: Verbal cues to stay closer to walker  Stairs            Wheelchair Mobility    Modified Rankin (Stroke Patients Only)       Balance Overall balance assessment: Needs assistance Sitting-balance support: Feet supported;No upper extremity supported Sitting balance-Leahy Scale: Good     Standing balance support: Single extremity supported;During functional activity Standing balance-Leahy Scale: Poor Standing balance comment: requires min A                             Pertinent Vitals/Pain Pain Assessment: No/denies pain    Home Living Family/patient expects to be discharged to:: Private residence Living Arrangements: Children;Spouse/significant other (daughter) Available Help at Discharge: Family;Available PRN/intermittently Type of Home: House Home Access: Stairs to enter   [employer] of Steps: 2 Home Layout: One level Home Equipment: None Additional Comments: spouse and daughter work during the day     Prior Function Level of Independence: Independent         Comments: Pt was fully independent.  Worked as a Psychologist, educational  time      Hand Dominance   Dominant Hand: Right    Extremity/Trunk Assessment   Upper Extremity Assessment: Defer to OT evaluation           Lower Extremity Assessment: Generalized weakness         Communication   Communication: No difficulties  Cognition Arousal/Alertness: Awake/alert Behavior During Therapy: WFL for tasks assessed/performed Overall  Cognitive Status: Within Functional Limits for tasks assessed                      General Comments General comments (skin integrity, edema, etc.): DOE 3/4; 02 sats 96% on RA; HR 122    Exercises        Assessment/Plan    PT Assessment Patient needs continued PT services  PT Diagnosis Difficulty walking;Generalized weakness   PT Problem List Decreased strength;Decreased activity tolerance;Decreased balance;Decreased mobility;Decreased knowledge of use of DME;Obesity  PT Treatment Interventions DME instruction;Gait training;Functional mobility training;Therapeutic activities;Therapeutic exercise;Balance training;Patient/family education   PT Goals (Current goals can be found in the Care Plan section) Acute Rehab PT Goals Patient Stated Goal: to regain strength and inependence  PT Goal Formulation: With patient Time For Goal Achievement: 09/20/14 Potential to Achieve Goals: Good    Frequency Min 3X/week   Barriers to discharge        Co-evaluation PT/OT/SLP Co-Evaluation/Treatment: Yes Reason for Co-Treatment: For patient/therapist safety PT goals addressed during session: Mobility/safety with mobility OT goals addressed during session: ADL's and self-care       End of Session   Activity Tolerance: Patient limited by fatigue Patient left: in chair;with call bell/phone within reach;with family/visitor present Nurse Communication: Mobility status (OT spoke with nursing)         Time: 4098-1191 PT Time Calculation (min) (ACUTE ONLY): 8 min   Charges:   PT Evaluation $Initial PT Evaluation Tier I: 1 Procedure     PT G Codes:        Celinda Dethlefs 2014/09/12, 4:13 PM  [facility] PT [phone]

## 2014-09-06 NOTE — Progress Notes (Signed)
ANTICOAGULATION CONSULT NOTE - Follow Up Consult  Pharmacy Consult for Heparin Indication: Atrial clot and infarcted kidney  Allergies  Allergen Reactions  . Codeine Itching    Patient Measurements: Height: $RemoveBe .9 cm) Weight: 248 lb 14.4 oz (112.9 kg) IBW/kg (Calculated) : 43.2 Heparin Dosing Weight:   Vital Signs: Temp: 97.8 F (36.6 C) (08/25 0400) Temp Source: Oral (08/25 0400) BP: 108/60 mmHg (08/25 0712) Pulse Rate: 72 (08/25 0712)  Labs:  Recent Labs  09/03/14 1600  09/04/14 0410 09/04/14 0520  09/05/14 0350 09/05/14 1650 09/06/14 0050 09/06/14 0712  HGB 13.4  --   --  13.5  --  13.3  --   --  12.5  HCT 40.1  --   --  41.0  --  40.3  --   --  37.0  PLT 219  --   --  226  --  226  --   --  237  APTT 25  --   --   --   --   --   --   --   --   LABPROT 16.6*  --   --   --   --   --   --   --   --   INR 1.33  --   --   --   --   --   --   --   --   HEPARINUNFRC  --   --   --  0.16*  < > 0.29* 0.15* 0.45 0.24*  CREATININE 1.22*  < > 1.49*  --   --  1.91*  --   --  2.28*  < > = values in this interval not displayed.  Estimated Creatinine Clearance: 30.2 mL/min (by C-G formula based on Cr of 2.28).   Medications:  Scheduled:  . cefTRIAXone (ROCEPHIN)  IV  1 g Intravenous Q24H  . metoprolol tartrate  12.5 mg Oral BID  . mometasone-formoterol  2 puff Inhalation BID  . pantoprazole  40 mg Oral Daily  . pneumococcal 23 valent vaccine  0.5 mL Intramuscular Tomorrow-1000  . pravastatin  40 mg Oral q1800  . sodium chloride  3 mL Intravenous Q12H    Assessment: 59yo female with atrial clot and infarcted kidney, on heparin and awaiting cath with Mitral and Aortic stenosis and need for lifelong anticoagulation.  Plan for cath when able to lie flat.  Heparin level is low again at 0.24 (AT3 low on 8/24), Hg 12.5, and pltc wnl.  Pt with bloody urine with UTI (awaiting repeat cx results) and kidney infarct.  Will increase heparin but hold on bolus.  Goal of  Therapy:  Heparin level 0.3-0.7 units/ml Monitor platelets by anticoagulation protocol: Yes   Plan:  Increase heparin to 1900 units/hr  Heparin level in 8hr Daily HL and CBC Continue to monitor for bleeding  Marisue Humble, PharmD Clinical Pharmacist [facility]

## 2014-09-06 NOTE — Consult Note (Signed)
[facility] Renal Consultation Note  Requesting MD: Short Indication for Consultation: elevated creatinine- acute thrombosis of left renal artery  HPI:  [name] [name] is a 59 y.o. female  with past medical history significant for asthma, hypercholesterolemia, valvular disease and obesity. She presented to the emergency department on 8/22 with complaints of a productive cough and also severe left sided abdominal flank and back pain for 3 days. She was noted to be in atrial fibrillation at the time and that was new for her. She underwent a CT of the abdomen with contrast and that was notable for an atrial thrombus and what was felt to be an acute occlusion of the left renal artery with minimal to no flow to the left kidney. Therefore, an acute left renal infarct. She was admitted and placed on heparin as well as diltiazem.  She has not thrived very well, her heart rate has been difficult to control she is having symptoms that are felt to be related to her severe mitral stenosis.  It is felt that she will ultimately need a mitral valve repair / replacement with a clot extraction plus or minus aortic valve replacement. They want to do cardiac catheterization prior to that surgery. The issue is that her creatinine has been steadily worsening day by day. Her baseline creatinine is 0.9. He was noted to be 1.22 time of admission and has steadily worsened to a level of 2.28 today. Her urine output has been adequate at around a liter. Her blood pressure has been lowish. Urine obtained yesterday is very remarkable for hematuria and proteinuria as well as pyuria, is on ceftriaxone. First urine culture just showed multiple species the second is pending.  Patient denies any dysuria. She has a limited understanding of what is going on. She's falling asleep while I'm talking to her.   CREATININE, SER  Date/Time Value Ref Range Status  09/06/2014 07:12 AM 2.28* 0.44 - 1.00 mg/dL Final  09/05/2014 03:50  AM 1.91* 0.44 - 1.00 mg/dL Final  09/04/2014 04:10 AM 1.49* 0.44 - 1.00 mg/dL Final  09/03/2014 05:40 PM 1.25* 0.44 - 1.00 mg/dL Final  09/03/2014 04:00 PM 1.22* 0.44 - 1.00 mg/dL Final  05/19/2014 10:53 PM 0.90 0.44 - 1.00 mg/dL Final  03/20/2012 12:30 PM 0.70 0.50 - 1.10 mg/dL Final     PMHx:   Past Medical History  Diagnosis Date  . Asthma   . Obesity   . Hypercholesteremia   . Heart murmur   . Atrial fibrillation with rapid ventricular response 09/03/2014  . Bifascicular block 09/12/2013  . 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 Pure hypercholesterolemia 09/12/2013  . Severe obesity (BMI >= 40) 09/12/2013  . 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 Chronic diastolic heart failure     Past Surgical History  Procedure Laterality Date  . Abdominal hysterectomy    . Knee arthroscopy      Family Hx:  Family History  Problem Relation Age of Onset  . Stroke Mother   . Prostate cancer Father     Social History:  reports that she has never smoked. She has never used smokeless tobacco. She reports that she drinks alcohol. She reports that she does not use illicit drugs.  Allergies:  Allergies  Allergen Reactions  . Codeine Itching    Medications: Prior to Admission medications   Medication Sig Start Date End Date Taking? Authorizing Provider  b complex vitamins capsule Take 1 capsule by mouth daily.  Yes Historical Provider, MD  benzonatate (TESSALON) 200 MG capsule Take 1 capsule (200 mg total) by mouth 3 (three) times daily as needed for cough. 08/31/14  Yes Juanito Doom, MD  cholecalciferol (VITAMIN D) 1000 UNITS tablet Take 2,000 Units by mouth daily.    Yes Historical Provider, MD  Coenzyme Q10 (COQ-10) 10 MG CAPS Take 1 capsule by mouth daily.   Yes Historical Provider, MD  mometasone-formoterol (DULERA) 100-5 MCG/ACT AERO Inhale 2 puffs into the lungs as needed for wheezing.   Yes Historical Provider, MD  OMEGA-3 FATTY ACIDS PO  Take 1,600 mg by mouth every Monday, Wednesday, and Friday.    Yes Historical Provider, MD  pantoprazole (PROTONIX) 40 MG tablet Take 1 tablet (40 mg total) by mouth daily. Take 30-60 min before first meal of the day 08/28/14  Yes Tanda Rockers, MD  pravastatin (PRAVACHOL) 40 MG tablet Take 40 mg by mouth daily.  09/11/13  Yes Historical Provider, MD  predniSONE (DELTASONE) 20 MG tablet Take 20 mg by mouth 2 (two) times daily with a meal.  08/27/14  Yes Historical Provider, MD  Respiratory Therapy Supplies (FLUTTER) DEVI Use as directed 08/28/14   Tanda Rockers, MD    I have reviewed the patient's current medications.  Labs:  Results for orders placed or performed during the hospital encounter of 09/03/14 (from the past 48 hour(s))  Heparin level (unfractionated)     Status: Abnormal   Collection Time: 09/04/14  2:20 PM  Result Value Ref Range   Heparin Unfractionated 0.29 (L) 0.30 - 0.70 IU/mL    Comment:        IF HEPARIN RESULTS ARE BELOW EXPECTED VALUES, AND PATIENT DOSAGE HAS BEEN CONFIRMED, SUGGEST FOLLOW UP TESTING OF ANTITHROMBIN III LEVELS.   TSH     Status: None   Collection Time: 09/04/14  2:20 PM  Result Value Ref Range   TSH 1.978 0.350 - 4.500 uIU/mL  Heparin level (unfractionated)     Status: Abnormal   Collection Time: 09/05/14 12:10 AM  Result Value Ref Range   Heparin Unfractionated 0.29 (L) 0.30 - 0.70 IU/mL    Comment:        IF HEPARIN RESULTS ARE BELOW EXPECTED VALUES, AND PATIENT DOSAGE HAS BEEN CONFIRMED, SUGGEST FOLLOW UP TESTING OF ANTITHROMBIN III LEVELS.   Heparin level (unfractionated)     Status: Abnormal   Collection Time: 09/05/14  3:50 AM  Result Value Ref Range   Heparin Unfractionated 0.29 (L) 0.30 - 0.70 IU/mL    Comment:        IF HEPARIN RESULTS ARE BELOW EXPECTED VALUES, AND PATIENT DOSAGE HAS BEEN CONFIRMED, SUGGEST FOLLOW UP TESTING OF ANTITHROMBIN III LEVELS.   CBC     Status: Abnormal   Collection Time: 09/05/14  3:50 AM   Result Value Ref Range   WBC 26.0 (H) 4.0 - 10.5 K/uL   RBC 4.50 3.87 - 5.11 MIL/uL   Hemoglobin 13.3 12.0 - 15.0 g/dL   HCT 40.3 36.0 - 46.0 %   MCV 89.6 78.0 - 100.0 fL   MCH 29.6 26.0 - 34.0 pg   MCHC 33.0 30.0 - 36.0 g/dL   RDW 15.4 11.5 - 15.5 %   Platelets 226 150 - 400 K/uL  Basic metabolic panel     Status: Abnormal   Collection Time: 09/05/14  3:50 AM  Result Value Ref Range   Sodium 132 (L) 135 - 145 mmol/L   Potassium 4.9 3.5 - 5.1 mmol/L  Chloride 104 101 - 111 mmol/L   CO2 17 (L) 22 - 32 mmol/L   Glucose, Bld 123 (H) 65 - 99 mg/dL   BUN 24 (H) 6 - 20 mg/dL   Creatinine, Ser 1.91 (H) 0.44 - 1.00 mg/dL   Calcium 8.5 (L) 8.9 - 10.3 mg/dL   GFR calc non Af Amer 28 (L) >60 mL/min   GFR calc Af Amer 32 (L) >60 mL/min    Comment: (NOTE) The eGFR has been calculated using the CKD EPI equation. This calculation has not been validated in all clinical situations. eGFR's persistently <60 mL/min signify possible Chronic Kidney Disease.    Anion gap 11 5 - 15  Urinalysis, Routine w reflex microscopic (not at [facility])     Status: Abnormal   Collection Time: 09/05/14  9:59 AM  Result Value Ref Range   Color, Urine RED (A) YELLOW    Comment: BIOCHEMICALS MAY BE AFFECTED BY COLOR   APPearance TURBID (A) CLEAR   Specific Gravity, Urine 1.042 (H) 1.005 - 1.030   pH 5.5 5.0 - 8.0   Glucose, UA NEGATIVE NEGATIVE mg/dL   Hgb urine dipstick LARGE (A) NEGATIVE   Bilirubin Urine LARGE (A) NEGATIVE   Ketones, ur 15 (A) NEGATIVE mg/dL   Protein, ur >300 (A) NEGATIVE mg/dL   Urobilinogen, UA 1.0 0.0 - 1.0 mg/dL   Nitrite POSITIVE (A) NEGATIVE   Leukocytes, UA MODERATE (A) NEGATIVE  Urine microscopic-add on     Status: Abnormal   Collection Time: 09/05/14  9:59 AM  Result Value Ref Range   Squamous Epithelial / LPF FEW (A) RARE   WBC, UA 21-50 <3 WBC/hpf   RBC / HPF 11-20 <3 RBC/hpf   Bacteria, UA MANY (A) RARE   Urine-Other AMORPHOUS URATES/PHOSPHATES   Sodium, urine, random      Status: None   Collection Time: 09/05/14  9:59 AM  Result Value Ref Range   Sodium, Ur <10 mmol/L  Creatinine, urine, random     Status: None   Collection Time: 09/05/14  9:59 AM  Result Value Ref Range   Creatinine, Urine 181.35 mg/dL  Urine culture     Status: None (Preliminary result)   Collection Time: 09/05/14  9:59 AM  Result Value Ref Range   Specimen Description URINE, RANDOM    Special Requests ADDED [id number]    Culture NO GROWTH < 24 HOURS    Report Status PENDING   Antithrombin III     Status: Abnormal   Collection Time: 09/05/14 12:52 PM  Result Value Ref Range   AntiThromb III Func 63 (L) 75 - 120 %  Homocysteine, serum     Status: None   Collection Time: 09/05/14 12:52 PM  Result Value Ref Range   Homocysteine 13.4 0.0 - 15.0 umol/L    Comment: (NOTE) Performed At: [facility] [place], [place] [id number] Lindon Romp MD HR:[id number]   Heparin level (unfractionated)     Status: Abnormal   Collection Time: 09/05/14  4:50 PM  Result Value Ref Range   Heparin Unfractionated 0.15 (L) 0.30 - 0.70 IU/mL    Comment:        IF HEPARIN RESULTS ARE BELOW EXPECTED VALUES, AND PATIENT DOSAGE HAS BEEN CONFIRMED, SUGGEST FOLLOW UP TESTING OF ANTITHROMBIN III LEVELS.   Heparin level (unfractionated)     Status: None   Collection Time: 09/06/14 12:50 AM  Result Value Ref Range   Heparin Unfractionated 0.45 0.30 - 0.70 IU/mL  Comment:        IF HEPARIN RESULTS ARE BELOW EXPECTED VALUES, AND PATIENT DOSAGE HAS BEEN CONFIRMED, SUGGEST FOLLOW UP TESTING OF ANTITHROMBIN III LEVELS.   Heparin level (unfractionated)     Status: Abnormal   Collection Time: 09/06/14  7:12 AM  Result Value Ref Range   Heparin Unfractionated 0.24 (L) 0.30 - 0.70 IU/mL    Comment:        IF HEPARIN RESULTS ARE BELOW EXPECTED VALUES, AND PATIENT DOSAGE HAS BEEN CONFIRMED, SUGGEST FOLLOW UP TESTING OF ANTITHROMBIN III LEVELS.   CBC     Status: Abnormal    Collection Time: 09/06/14  7:12 AM  Result Value Ref Range   WBC 25.2 (H) 4.0 - 10.5 K/uL   RBC 4.24 3.87 - 5.11 MIL/uL   Hemoglobin 12.5 12.0 - 15.0 g/dL   HCT 37.0 36.0 - 46.0 %   MCV 87.3 78.0 - 100.0 fL   MCH 29.5 26.0 - 34.0 pg   MCHC 33.8 30.0 - 36.0 g/dL   RDW 15.4 11.5 - 15.5 %   Platelets 237 150 - 400 K/uL  Renal function panel     Status: Abnormal   Collection Time: 09/06/14  7:12 AM  Result Value Ref Range   Sodium 128 (L) 135 - 145 mmol/L   Potassium 4.9 3.5 - 5.1 mmol/L   Chloride 99 (L) 101 - 111 mmol/L   CO2 19 (L) 22 - 32 mmol/L   Glucose, Bld 108 (H) 65 - 99 mg/dL   BUN 29 (H) 6 - 20 mg/dL   Creatinine, Ser 2.28 (H) 0.44 - 1.00 mg/dL   Calcium 8.5 (L) 8.9 - 10.3 mg/dL   Phosphorus 4.0 2.5 - 4.6 mg/dL   Albumin 2.4 (L) 3.5 - 5.0 g/dL   GFR calc non Af Amer 22 (L) >60 mL/min   GFR calc Af Amer 26 (L) >60 mL/min    Comment: (NOTE) The eGFR has been calculated using the CKD EPI equation. This calculation has not been validated in all clinical situations. eGFR's persistently <60 mL/min signify possible Chronic Kidney Disease.    Anion gap 10 5 - 15     ROS:  A comprehensive review of systems was negative except for: Respiratory: positive for dyspnea on exertion but she says is slightly better than previously   Physical Exam: Filed Vitals:   09/06/14 0712  BP: 108/60  Pulse: 72  Temp:   Resp: 22     General:  obese. 1-2 word answers. Falling asleep - does also seem to be short of breath with increased respiratory rate  HEENT: pupils are equal round reactive to light, extra motions are intact, mucous membranes are moist  Neck: there is jugular venous distention  Heart: irregular  Lungs: poor effort. 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 She has suffered acute kidney injury  1.Renal- acute kidney injury. Mostly likely secondary to this acute left renal infarct. However, patient also underwent a CT scan with contrast early on and has had atrial fibrillation with low blood pressures which could be impacting kidney function as well. Her urinalysis is somewhat uninterpretable in the setting. She has suggestion of a urinary tract infection and is on ceftriaxone. She is making a reasonable amount of urine. I feel like with supportive care  her renal function should improve eventually ,  however, some of these other issues are hampering recovery right now. I definitely would not proceed with cardiac catheterization with renal function worsening daily as that will hamper recovery even more. I do understand that she needs a cardiac catheterization but would wait for things to "cool off" before proceeding. 2. Hypertension/volume  - blood pressure is lowish which could be affecting kidney perfusion. Unfortunately, she requires Cardizem and metoprolol at this time to keep her rate controlled. She has received intermittent doses of Lasix. Is very difficult to determine her volume status at this time given her obesity. I did not does with any additional Lasix today.  3.Anemia-  Fortunately does not appear to be an issue at this time 4. Possible UTI- second culture is pending and she is on ceftriaxone    Dylon Correa A 09/06/2014, 12:41 PM

## 2014-09-06 NOTE — Progress Notes (Signed)
[facility] PROGRESS NOTE  [name] [name] KGM:[id number] DOB: 1955/02/18 DOA: 09/03/2014 PCP: [name] Lek, MD  Brief Summary  The patient is a 59 year old female with history of asthma/pseudo-asthma who presented to the emergency department with productive cough for 1.5 weeks.  She also had severe left-sided abdominal, flank, and back pain. She was seen 1 week prior to admission by her pulmonologist to gave her medication for bronchitis and respiratory infection.  She was found to be in a-fib with RVR.  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  She was anticoagulated with heparin.  ECHO demonstrated severe mitral valve stenosis and moderate AV stenosis.  Her acute hypoxic respiratory failure, dyspnea, left atrial thrombus, atrial fibrillation, and moderate pulmonary hypertension are all probably secondary to her severe mitral stenosis.  Her course is also complicated by AKI secondary to a complete left renal infarction and her creatinine continues to rise.  She may also have had some contrast induced nephropathy from her CT scan a few days ago.  Plan is to address her primary problem which is valvular during this admission.    Step 1:  Left and right heart catheterizations once breathing and kidney function are stable Step 2:  Cardiothoracic surgery consultation for definitive management of valvular issues  In the meantime, continuing to work on volume status, rate control and Nephrology is consulting regarding AKI.    Assessment/Plan   Severe mitral valve stenosis/mild to moderate aortic valve stenosis.  She also has severely reduced systolic function of the right ventricle and a moderately dilated RA, PA peak pressure of 64 mmHg suggesting moderate pulmonary hypertension. -  Cardiothoracic surgery consultation pending for definitive management, likely  during this hospitalization  A. fib with RVR, new onset and likely a complication from severe MR, chads2vasc score of 1 for female gender, 0.6% chance of stroke per year.  She does not need anticoagulation other than aspirin but has other indications for full anticoagulation as below.  Due to her valvular issues, she will require coumadin at discharge.   -  Rate control per cardiology:  continue diltiazem drip to goal HR of 60s as blood pressure tolerates -  Telemetry: a-fib in 76s -  Appreciate cardiology assistance -  Anticoagulation as below  Large left atrial thrombus due to severe LAD from severe mitral valve stenosis.  Hypercoagulable state less likely given valvular issue but panel sent for completeness sake.    -  IV heparin -  If she undergoes valve surgery, anticipate they would perform thrombectomy at time of procedure. -  F/u hypercoagulable panel  Acute hypoxic respiratory failure secondary to acute diastolic heart failure.   -  CXR with CHF and interstitial edema -  Daily weights:  Wt down 5kg -  Strict I/O:  + yesterday -  No further lasix today in anticipation of possible cardiac cath on Friday and because of borderline hypotension  Left renal infarction likely due to thromboembolic event from left atrial thrombus.  Complete loss of left kidney.  Pain improving somewhat.   -  Heparin as above  Acute kidney injury, creatinine baseline of 0.9 and currently 2.28 and rising, probably secondary to renal infarct and recent IV contrast.   -  FENa < 1%, but may be due to cardiorenal syndrome -  No evidence of obstruction on CT -  Minimize nephrotoxins and renally dose medications -  Appreciate nephrology assistance  Hyponatremia, likely hypovolemic from heart failure -  Holding on diuresis for now in anticipation of cath  Asymmetric bilateral lower extremity edema -  Venous duplex LE  Probable peripheral vascular disease -  Follow-up ABI  Hypertension, blood  pressure low normal  Hyperkalemia, resolved with IV fluids and Kayexalate  Pyuria -  First urine culture with mixed flora -  Continue ceftriaxone and follow-up second urine culture  Leukocytosis may be reactive to kidney infarction -  Repeat CXR without pneumonia -  UA positive nitrite and moderate LE, many bacteria and 21-50 WBC -  F/u second urine culture -  No recent steroids  Constipation -  Start colace, senna -  Lactulose prn  Diet:  Healthy heart Access:  PIV IVF:  off Proph:  heparin  Code Status: full Family Communication: patient alone today Disposition Plan:  Pending improvement in kidney function, heart catheterization, probable definitive treatment of her severe MR.  Anticipate prolonged hospitalization and disposition probably to SNF depending on progression.   Consultants:  Cardiology, Dr. Royann [name]  Vascular surgery by phone, Dr. Zachery [name]  Nephrology, Dr. Arrie [name]  Cardiothoracic surgery consult pending  Procedures:  KUB  CT angio abd/pelvis  ECHO  CXR  Planned:  [facility] on 8/26  Antibiotics:  Ceftriaxone 8/22 >  HPI/Subjective:  Left flank and left upper quadrant pain have improved somewhat. She continues to feel fatigued and [name] [name] of breath.  No bowel movement in several days.    Objective: Filed Vitals:   09/06/14 0400 09/06/14 0600 09/06/14 0712 09/06/14 0713  BP:   108/60   Pulse:   72   Temp: 97.8 F (36.6 C)     TempSrc: Oral     Resp:   22   Height:      Weight:  112.9 kg (248 lb 14.4 oz)    SpO2:   92% 92%    Intake/Output Summary (Last 24 hours) at 09/06/14 0800 Last data filed at 09/06/14 0700  Gross per 24 hour  Intake   1160 ml  Output   1000 ml  Net    160 ml   Filed Weights   09/03/14 2131 09/05/14 0516 09/06/14 0600  Weight: 109.317 kg (241 lb) 118.071 kg (260 lb 4.8 oz) 112.9 kg (248 lb 14.4 oz)   Body mass index is 50.24 kg/(m^2).  Exam:   General:  Adult female, No acute distress, ill-appearing  slouched in chair and tachypneic  HEENT:  NCAT, MMM, JVP to preauricular space while mostly upright  Cardiovascular:  IRRR, 2/6 murmur, rate in the 70s  Respiratory:  Diminished bilateral breath sounds with rales at bases  Abdomen:   NABS, soft, nondistended, obese, tender to palpation in the left upper quadrant without rebound or guarding  MSK:   Normal tone and bulk, 2+ LLE edema and 1+ RLE edema  Neuro:  Grossly intact  Data Reviewed: Basic Metabolic Panel:  Recent Labs Lab 09/03/14 1600 09/03/14 1740 09/04/14 0009 09/04/14 0410 09/04/14 0750 09/05/14 0350  NA 137 137  --  133*  --  132*  K 5.3* 5.6* 5.4* 5.9* 5.8* 4.9  CL 104 105  --  103  --  104  CO2 22 22  --  18*  --  17*  GLUCOSE 158* 173*  --  177*  --  123*  BUN 21* 22*  --  20  --  24*  CREATININE 1.22* 1.25*  --  1.49*  --  1.91*  CALCIUM 8.9 8.9  --  8.7*  --  8.5*  MG 2.1  --   --   --   --   --    Liver Function Tests:  Recent Labs Lab 09/03/14 1600  AST 60*  ALT 69*  ALKPHOS 143*  BILITOT 1.0  PROT 6.3*  ALBUMIN 3.1*    Recent Labs Lab 09/03/14 1600  LIPASE 20*   No results for input(s): AMMONIA in the last 168 hours. CBC:  Recent Labs Lab 09/03/14 1600 09/04/14 0520 09/05/14 0350  WBC 10.7* 14.4* 26.0*  NEUTROABS 9.1*  --   --   HGB 13.4 13.5 13.3  HCT 40.1 41.0 40.3  MCV 88.1 90.5 89.6  PLT 219 226 226    Recent Results (from the past 240 hour(s))  Urine culture     Status: None   Collection Time: 09/03/14  8:35 PM  Result Value Ref Range Status   Specimen Description URINE, CLEAN CATCH  Final   Special Requests NONE  Final   Culture   Final    MULTIPLE SPECIES PRESENT, SUGGEST RECOLLECTION Performed at [facility]    Report Status 09/05/2014 FINAL  Final  MRSA PCR Screening     Status: None   Collection Time: 09/03/14  9:27 PM  Result Value Ref Range Status   MRSA by PCR NEGATIVE NEGATIVE Final    Comment:        The GeneXpert MRSA Assay (FDA approved  for NASAL specimens only), is one component of a comprehensive MRSA colonization surveillance program. It is not intended to diagnose MRSA infection nor to guide or monitor treatment for MRSA infections.      Studies: Dg Chest Port 1 View  09/05/2014   CLINICAL DATA:  Follow-up of elevated white blood cell count  EXAM: PORTABLE CHEST - 1 VIEW  COMPARISON:  Chest x-ray of September 03, 2014  FINDINGS: The patient is positioned in a lordotic matter today which limits the apparent expansion of the lungs. The interstitial markings remain increased and there is subsegmental atelectasis at the right lung base. There is a small amount of fluid in the minor fissure. The cardiac silhouette remains enlarged. The pulmonary vascularity is more conspicuous today. The bony thorax exhibits no acute abnormality.  IMPRESSION: CHF with mild interstitial edema. Subsegmental atelectasis at the right lung base. When the patient can tolerate the procedure, a PA and lateral chest x-ray would be useful.   Electronically Signed   By: David  [place] M.D.   On: 09/05/2014 08:17    Scheduled Meds: . cefTRIAXone (ROCEPHIN)  IV  1 g Intravenous Q24H  . metoprolol tartrate  12.5 mg Oral BID  . mometasone-formoterol  2 puff Inhalation BID  . pantoprazole  40 mg Oral Daily  . pneumococcal 23 valent vaccine  0.5 mL Intramuscular Tomorrow-1000  . pravastatin  40 mg Oral q1800  . sodium chloride  3 mL Intravenous Q12H   Continuous Infusions: . diltiazem (CARDIZEM) infusion 15 mg/hr (09/06/14 0415)  . heparin 1,750 Units/hr (09/06/14 0414)    Principal Problem:   Atrial fibrillation with rapid ventricular response Active Problems:   Renal infarction   Hyperkalemia   Left atrial thrombus   Acute renal artery occlusion   Mitral valve stenosis and aortic valve stenosis   Rheumatic mitral stenosis   Rheumatic aortic stenosis   Chronic diastolic heart failure    Time spent: 30 min    Dany Walther, [facility]  [facility] Pager [phone]. If 7PM-7AM, please contact night-coverage at [email or web address], password  [email or web address] 09/06/2014, 8:00 AM  LOS: 3 days

## 2014-09-06 NOTE — Progress Notes (Signed)
ANTICOAGULATION CONSULT NOTE - FOLLOW UP    HL = 0.16 (goal 0.3 - 0.7 units/mL) Heparin dosing weight = 70 kg   Assessment: 58 YOF with an atrial clot and infarcted kidney to continue on IV heparin.  Heparin level is sub-therapeutic and has trended down.  RN reported patient's IV line came out for an unknown period of time and a new IV was placed around 1715.  There were some oozing around IV site but it has stopped.  CVTS concerned with a possible stroke and recommended an MRI while continuing IV heparin.   Plan: - Continue heparin gtt at 1900 units/hr - Recheck 6 hr HL    [name] [name] D. Laney Potash, PharmD, BCPS 09/06/2014, 7:51 PM

## 2014-09-06 NOTE — Progress Notes (Signed)
Patient Name: [name] [name] Date of Encounter: 09/06/2014   SUBJECTIVE  Feeling better. Able to recline but still unable to completley lay flat.   CURRENT MEDS . cefTRIAXone (ROCEPHIN)  IV  1 g Intravenous Q24H  . metoprolol tartrate  12.5 mg Oral BID  . mometasone-formoterol  2 puff Inhalation BID  . pantoprazole  40 mg Oral Daily  . pneumococcal 23 valent vaccine  0.5 mL Intramuscular Tomorrow-1000  . pravastatin  40 mg Oral q1800  . sodium chloride  3 mL Intravenous Q12H    OBJECTIVE  Filed Vitals:   09/06/14 0400 09/06/14 0600 09/06/14 0712 09/06/14 0713  BP:   108/60   Pulse:   72   Temp: 97.8 F (36.6 C)     TempSrc: Oral     Resp:   22   Height:      Weight:  248 lb 14.4 oz (112.9 kg)    SpO2:   92% 92%    Intake/Output Summary (Last 24 hours) at 09/06/14 0925 Last data filed at 09/06/14 0700  Gross per 24 hour  Intake    800 ml  Output   1000 ml  Net   -200 ml   Filed Weights   09/03/14 2131 09/05/14 0516 09/06/14 0600  Weight: 241 lb (109.317 kg) 260 lb 4.8 oz (118.071 kg) 248 lb 14.4 oz (112.9 kg)    PHYSICAL EXAM  General: Pleasant, NAD. Neuro: Alert and oriented X 3. Moves all extremities spontaneously. Psych: Normal affect. HEENT:  Normal  Neck: Supple without bruits/ + JVD. Lungs:  Resp regular and unlabored. Faint bibasilar crackles.  Heart: Ir Ir  no s3, s4. Diastolic murmurs. Abdomen: Soft, non-tender, non-distended, BS + x 4.  Extremities: No clubbing, cyanosis or edema. DP/PT/Radials 2+ and equal bilaterally.  Accessory Clinical Findings  CBC  Recent Labs  09/03/14 1600  09/05/14 0350 09/06/14 0712  WBC 10.7*  < > 26.0* 25.2*  NEUTROABS 9.1*  --   --   --   HGB 13.4  < > 13.3 12.5  HCT 40.1  < > 40.3 37.0  MCV 88.1  < > 89.6 87.3  PLT 219  < > 226 237  < > = values in this interval not displayed. Basic Metabolic Panel  Recent Labs  09/03/14 1600  09/05/14 0350 09/06/14 0712  NA 137  < > 132* 128*  K 5.3*  < >  4.9 4.9  CL 104  < > 104 99*  CO2 22  < > 17* 19*  GLUCOSE 158*  < > 123* 108*  BUN 21*  < > 24* 29*  CREATININE 1.22*  < > 1.91* 2.28*  CALCIUM 8.9  < > 8.5* 8.5*  MG 2.1  --   --   --   PHOS  --   --   --  4.0  < > = values in this interval not displayed. Liver Function Tests  Recent Labs  09/03/14 1600 09/06/14 0712  AST 60*  --   ALT 69*  --   ALKPHOS 143*  --   BILITOT 1.0  --   PROT 6.3*  --   ALBUMIN 3.1* 2.4*    Recent Labs  09/03/14 1600  LIPASE 20*   Thyroid Function Tests  Recent Labs  09/04/14 1420  TSH 1.978    TELE  Afib at rate of 70-90s.   Radiology/Studies  Dg Chest 2 View  08/28/2014   CLINICAL DATA:  Shortness of breath and chest  pain.  Mild fever.  EXAM: CHEST  2 VIEW  COMPARISON:  May 19, 2014  FINDINGS: There is no edema or consolidation. Heart is upper normal in size with pulmonary vascularity within normal limits. No adenopathy. No pneumothorax. No bone lesions.  IMPRESSION: No edema or consolidation.   Electronically Signed   By: Bretta Bang III M.D.   On: 08/28/2014 09:47   Dg Chest Port 1 View  09/05/2014   CLINICAL DATA:  Follow-up of elevated white blood cell count  EXAM: PORTABLE CHEST - 1 VIEW  COMPARISON:  Chest x-ray of September 03, 2014  FINDINGS: The patient is positioned in a lordotic matter today which limits the apparent expansion of the lungs. The interstitial markings remain increased and there is subsegmental atelectasis at the right lung base. There is a small amount of fluid in the minor fissure. The cardiac silhouette remains enlarged. The pulmonary vascularity is more conspicuous today. The bony thorax exhibits no acute abnormality.  IMPRESSION: CHF with mild interstitial edema. Subsegmental atelectasis at the right lung base. When the patient can tolerate the procedure, a PA and lateral chest x-ray would be useful.   Electronically Signed   By: David  [place] M.D.   On: 09/05/2014 08:17   Dg Abd Acute  W/chest  09/03/2014   CLINICAL DATA:  Congestion for 2 weeks. History of heart murmur. Vomiting.  EXAM: DG ABDOMEN ACUTE W/ 1V CHEST  COMPARISON:  08/30/2014 and 05/19/2014  FINDINGS: Interstitial markings are mildly prominent but similar to the previous examination. Heart size is slightly prominent but stable. The trachea is midline. There is no evidence to suggest free air. Gas in the stomach. Small amount of gas and stool in the colon. Nonobstructive bowel gas pattern. Mild degenerative changes at the pubic symphysis.  IMPRESSION: Slightly prominent lung markings which are similar to the previous examinations. Difficult to exclude mild interstitial edema or vascular congestion.  Gaseous distention of the stomach.   Electronically Signed   By: Richarda Overlie M.D.   On: 09/03/2014 18:51   Ct Cta Abd/pel W/cm &/or W/o Cm  09/03/2014   CLINICAL DATA:  59 year old with left-sided abdominal pain for 2 weeks. New onset of atrial fibrillation.  EXAM: CT ANGIOGRAPHY ABDOMEN AND PELVIS  TECHNIQUE: Multidetector CT imaging of the abdomen and pelvis was performed using the standard protocol during bolus administration of intravenous contrast. Multiplanar reconstructed images including MIPs were obtained and reviewed to evaluate the vascular anatomy.  CONTRAST:  100 mL Omnipaque 350  COMPARISON:  Abdominal radiographs 09/03/2014  FINDINGS: ARTERIAL FINDINGS:  Aorta/Heart: Large filling defects in the posterior left atrium are most compatible with thrombus. The entire left atrium is not imaged. Right atrium appears to be enlarged. Cannot exclude thrombus in the right pulmonary veins. The abdominal aorta is patent without aneurysm or dissection.  Celiac axis: Celiac trunk and main branch vessels are patent, including the splenic artery.  Superior mesenteric: The superior mesenteric artery is patent. Main branch vessels are patent.  Left renal: Occlusion of the left renal artery just beyond the origin. Minimal blood flow to the  left kidney even on the delayed images.  Right renal:         Right renal artery is patent.  Inferior mesenteric: Patent.  Left iliac: Left iliac arteries are patent. The proximal left femoral arteries are patent.  Right iliac: Right iliac arteries are patent. The proximal right femoral arteries are patent.  Venous findings: IVC and renal veins are patent. Portal venous  system is patent.  Review of the MIP images confirms the above findings.  NONVASCULAR FINDINGS:  There is a small-moderate sized right pleural effusion. Few patchy densities in the right middle lobe may represent atelectasis. No gross abnormality to the liver. There is a small amount of perihepatic ascites and fluid around the gallbladder. Suspect a small amount of periportal edema. No gross abnormality to the spleen or adrenal glands. No gross abnormality to the pancreas. Suspect scarring along the right kidney lower pole. The left kidney is edematous with minimal arterial flow. Findings compatible with acute vascular occlusion of the left renal artery with left kidney ischemia.  Some of the images are limited by motion artifact. No gross abnormality to the stomach or duodenum. Small amount of free fluid in the pelvis. Uterus is absent. No gross abnormality to the urinary bladder. No gross abnormality to the small or large bowel.  No acute bone abnormality.  IMPRESSION: Large filling defect in the left atrium is suggestive for thrombus. An underlying lesion cannot be excluded. This may be better characterized with an echocardiogram.  Occlusion of the proximal left renal artery with no significant flow to the left kidney. 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 Findings compatible with thromboembolic disease originating from the left atrium.  Small amount of fluid in the right abdomen and pelvis.  Critical Value/emergent results were called by telephone at the time of interpretation on 09/03/2014 at 8:46 pm  to Dr. Benjiman Core , who verbally acknowledged these results.   Electronically Signed   By: Richarda Overlie M.D.   On: 09/03/2014 21:05    ASSESSMENT AND PLAN  59 year old female with past medical history of mitral stenosis, aortic stenosis for evaluation of new-onset atrial fibrillation. Patient denies history of rheumatic fever. Echocardiogram September 2015 showed normal LV function, elevated left ventricular filling pressures, moderate aortic stenosis with mean gradient 27 mmHg, trace aortic insufficiency, severe mitral stenosis with mean gradient 20 mmHg and moderately elevated pulmonary pressures.  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 Target rate of 60s. Continue IV dilt (57ml/hr) and lopressor 12.5mg  BID. Transition to PO dilt once rate is more stable. BP stable. Breathing improved, able to recline. Cath once able to lay flat [facility] tomorrow) and and TEE (assess if MV can be treated with surgical commissurotomy versus replacement with a mechanical prosthesis). Will need surgery with mitral valve repair replacement and LA clot extraction, +/- aortic valve replacement. Continue IV heparin. Will need coumadin due to valvular disease.  Signed, Bhagat,Bhavinkumar PA-C Pager [phone]  I have seen and examined the patient along with Bhagat,Bhavinkumar PA-C.  I have reviewed the chart, notes and new data.  I agree with PA's note.  Key new complaints: breathing better, but still orthopneic. Pain has lessened Key examination changes: still with marked JVD to jaw, RV heave, ventricular rate now in the 80s Key new findings / data: creatinine steadily deteriorating  (renal infarction, diuresis +/- contrast nephrotoxicity from the CT)  PLAN: Unless renal function shows even more marked rate of deterioration tomorrow, I would proceed with coronary angio with minimum contrast load, as part of R/L heart  cath for MS. Target ventricular rates in the 60s - increase AV blocking agents slightly and switch to PO. Reviewed echo. Unfortunately, right ventricular function looks pretty bad. Hope for improvement with improved rate control and diuresis.   Thurmon Fair, MD, [facility] [phone] 09/06/2014, 11:14 AM

## 2014-09-07 ENCOUNTER — Inpatient Hospital Stay (HOSPITAL_COMMUNITY): Payer: Commercial Managed Care - HMO

## 2014-09-07 ENCOUNTER — Encounter (HOSPITAL_COMMUNITY)
Admission: EM | Disposition: A | Discharge status: Home Health | Payer: Self-pay | Source: Home / Self Care | Attending: Internal Medicine

## 2014-09-07 ENCOUNTER — Encounter (HOSPITAL_COMMUNITY): Payer: Self-pay | Admitting: Thoracic Surgery (Cardiothoracic Vascular Surgery)

## 2014-09-07 ENCOUNTER — Encounter (HOSPITAL_COMMUNITY): Payer: Commercial Managed Care - HMO

## 2014-09-07 DIAGNOSIS — Z01810 Encounter for preprocedural cardiovascular examination: Secondary | ICD-10-CM

## 2014-09-07 DIAGNOSIS — R600 Localized edema: Secondary | ICD-10-CM

## 2014-09-07 DIAGNOSIS — I350 Nonrheumatic aortic (valve) stenosis: Secondary | ICD-10-CM

## 2014-09-07 DIAGNOSIS — I342 Nonrheumatic mitral (valve) stenosis: Secondary | ICD-10-CM

## 2014-09-07 LAB — CBC
HCT: 35.8 % — ABNORMAL LOW (ref 36.0–46.0)
Hemoglobin: 12 g/dL (ref 12.0–15.0)
MCH: 28.9 pg (ref 26.0–34.0)
MCHC: 33.5 g/dL (ref 30.0–36.0)
MCV: 86.3 fL (ref 78.0–100.0)
PLATELETS: 233 10*3/uL (ref 150–400)
RBC: 4.15 MIL/uL (ref 3.87–5.11)
RDW: 15.4 % (ref 11.5–15.5)
WBC: 23.2 10*3/uL — AB (ref 4.0–10.5)

## 2014-09-07 LAB — PROTEIN C ACTIVITY: Protein C Activity: 25 % — ABNORMAL LOW (ref 74–151)

## 2014-09-07 LAB — PROTEIN S, TOTAL: PROTEIN S AG TOTAL: 111 % (ref 58–150)

## 2014-09-07 LAB — CARDIOLIPIN ANTIBODIES, IGG, IGM, IGA: Anticardiolipin IgG: <9 GPL U/mL (ref 0–14)

## 2014-09-07 LAB — LUPUS ANTICOAGULANT PANEL
DRVVT: 40.1 s (ref 0.0–55.1)
PTT LA: 42.7 s (ref 0.0–50.0)

## 2014-09-07 LAB — RENAL FUNCTION PANEL
ALBUMIN: 2.3 g/dL — AB (ref 3.5–5.0)
Anion gap: 9 (ref 5–15)
BUN: 32 mg/dL — AB (ref 6–20)
CHLORIDE: 96 mmol/L — AB (ref 101–111)
CO2: 20 mmol/L — ABNORMAL LOW (ref 22–32)
CREATININE: 2.38 mg/dL — AB (ref 0.44–1.00)
Calcium: 8.2 mg/dL — ABNORMAL LOW (ref 8.9–10.3)
GFR calc Af Amer: 25 mL/min — ABNORMAL LOW (ref >60)
GFR, EST NON AFRICAN AMERICAN: 21 mL/min — AB (ref >60)
GLUCOSE: 107 mg/dL — AB (ref 65–99)
PHOSPHORUS: 4.4 mg/dL (ref 2.5–4.6)
Potassium: 4.6 mmol/L (ref 3.5–5.1)
Sodium: 125 mmol/L — ABNORMAL LOW (ref 135–145)

## 2014-09-07 LAB — BETA-2-GLYCOPROTEIN I ABS, IGG/M/A
Beta-2 Glyco I IgG: <9 GPI IgG units (ref 0–20)
Beta-2-Glycoprotein I IgA: <9 GPI IgA units (ref 0–25)

## 2014-09-07 LAB — HEPARIN LEVEL (UNFRACTIONATED)
HEPARIN UNFRACTIONATED: 0.32 [IU]/mL (ref 0.30–0.70)
Heparin Unfractionated: 0.44 IU/mL (ref 0.30–0.70)

## 2014-09-07 LAB — PROTEIN C, TOTAL: PROTEIN C, TOTAL: 54 % — AB (ref 70–140)

## 2014-09-07 LAB — PROTEIN S ACTIVITY: Protein S Activity: 60 % (ref 60–145)

## 2014-09-07 SURGERY — RIGHT/LEFT HEART CATH AND CORONARY ANGIOGRAPHY
Anesthesia: LOCAL

## 2014-09-07 MED ORDER — FUROSEMIDE 10 MG/ML IJ SOLN
40.0000 mg | Freq: Two times a day (BID) | INTRAMUSCULAR | Status: DC
  Administered 2014-09-07 – 2014-09-12 (×11): 40 mg via INTRAVENOUS
  Filled 2014-09-07 (×11): qty 4

## 2014-09-07 MED ORDER — SODIUM CHLORIDE 0.9 % IJ SOLN: 3.0000 mL | INTRAMUSCULAR | Status: DC | PRN

## 2014-09-07 MED ORDER — SODIUM CHLORIDE 0.9 % IV SOLN
INTRAVENOUS | Status: DC
Start: 2014-09-08 — End: 2014-09-09
  Administered 2014-09-07: 05:00:00 via INTRAVENOUS

## 2014-09-07 MED ORDER — CIPROFLOXACIN HCL 500 MG PO TABS: 250.0000 mg | ORAL_TABLET | Freq: Two times a day (BID) | ORAL | Status: DC

## 2014-09-07 MED ORDER — SODIUM CHLORIDE 0.9 % IJ SOLN
3.0000 mL | Freq: Two times a day (BID) | INTRAMUSCULAR | Status: DC
  Administered 2014-09-07: 3 mL via INTRAVENOUS

## 2014-09-07 MED ORDER — ASPIRIN 81 MG PO CHEW: 81.0000 mg | CHEWABLE_TABLET | ORAL | Status: DC

## 2014-09-07 MED ORDER — SODIUM CHLORIDE 0.9 % IV SOLN: 250.0000 mL | INTRAVENOUS | Status: DC | PRN

## 2014-09-07 MED ORDER — ASPIRIN 81 MG PO CHEW
81.0000 mg | CHEWABLE_TABLET | ORAL | Status: AC
  Administered 2014-09-07: 81 mg via ORAL
  Filled 2014-09-07: qty 1

## 2014-09-07 NOTE — Progress Notes (Signed)
CARDIAC REHAB PHASE I   PRE:  Rate/Rhythm: 65 afib  BP:  Supine:   Sitting: 117/72  Standing:    SaO2: 94%RA  MODE:  Ambulation: 160 ft   POST:  Rate/Rhythm: 76 afib  BP:  Supine:   Sitting: 127/87  Standing:    SaO2: 98% 2L 1347-1435 Pt walked 160 ft on 2L with gait belt use, rolling walker, asst x 1 and one asst to follow with recliner. Pt sat twice to rest. Denied SOB but DOE noted during walk. Sats in hallway at 98% 2L. Pt did not want to use oxygen in room. Pt is motivated to walk. C/o feeling very sleepy today. Deconditioned. Will keep as asst x 2.    Luetta Nutting, RN BSN  09/07/2014 2:32 PM

## 2014-09-07 NOTE — Progress Notes (Addendum)
[facility] PROGRESS NOTE  [name] [name] ZOX:[id number] DOB: 1955-08-08 DOA: 09/03/2014 PCP: Delorse Lek, MD  Brief Summary  The patient is a 59 year old female with history of asthma/pseudo-asthma who presented to the emergency department with productive cough for 1.5 weeks.  She also had severe left-sided abdominal, flank, and back pain. She was seen 1 week prior to admission by her pulmonologist to gave her medication for bronchitis and respiratory infection.  She was found to be in a-fib with RVR.  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  She was anticoagulated with heparin.  ECHO demonstrated severe mitral valve stenosis and moderate AV stenosis.  Her acute hypoxic respiratory failure, dyspnea, left atrial thrombus, atrial fibrillation, and moderate pulmonary hypertension are all probably secondary to her severe mitral stenosis.  Her course is also complicated by AKI secondary to a complete left renal infarction and her creatinine continues to rise.  She may also have had some contrast induced nephropathy from her CT scan a few days ago.  In the meantime, continuing to work on volume status, rate control and Nephrology is consulting regarding AKI.    Assessment/Plan  Severe mitral valve stenosis/mild to moderate aortic valve stenosis.  She also has severely reduced systolic function of the right ventricle and a moderately dilated RA, PA peak pressure of 64 mmHg suggesting moderate pulmonary hypertension. -  Cardiothoracic surgery recommends anticoagulation for at least a month prior to proceeding with surgery -  Beginning cardiac rehab/PT -  PFTs unable to be completed today due to fatigue -  Carotid arteries 1-39% with antegrade VA flow -  preop RHC/LHC postponed until kidney function improves  A. fib with RVR, new onset and likely a complication  from severe MR, chads2vasc score of 1 for female gender, 0.6% chance of stroke per year.  She does not need anticoagulation other than aspirin but has other indications for full anticoagulation as below.  Due to her valvular issues, she will require coumadin at discharge.   -  Rate control per cardiology:  continue diltiazem PO -  Telemetry: a-fib in 64s -  Appreciate cardiology assistance -  Anticoagulation as below  Large left atrial thrombus due to severe LAD from severe mitral valve stenosis.  Hypercoagulable state less likely given valvular issue but panel sent for completeness sake.    -  IV heparin -  F/u hypercoagulable panel:  Cardiolipin neg, homocysteine neg, beta2-glycoprotein neg, lupus ac neg, protein S activity and total wnl, protein C activity and total low, antithrombin III low 63. -  Prothrombin and factor V leiden pending  Acute hypoxic respiratory failure secondary to acute diastolic heart failure.   -  Starting lasix 40mg  IV BID  Left renal infarction likely due to thromboembolic event from left atrial thrombus.  Complete loss of left kidney.   -  Pain improving  -  Heparin as above  Acute kidney injury, creatinine baseline of 0.9 and currently 2.28 and continuing to rise -  Probably secondary to renal infarct and recent IV contrast -  No evidence of obstruction on CT -  Heart cath deferred for now until renal function improves -  uop recorded yesterday  Hyponatremia, likely hypovolemic from heart failure and trending down -  Lasix resumed -  Repeat BMP in AM  Asymmetric bilateral lower extremity edema -  Venous duplex LE neg for DVT  Probable peripheral vascular disease -  Follow-up ABI  Hypertension, blood pressure low normal  Hyperkalemia, resolved with IV fluids and Kayexalate  Pyuria, urine culture negative -  D/c ceftriaxone  Leukocytosis may be reactive to kidney infarction and resolving -  Repeat CXR without pneumonia and UCx  neg  Constipation, unresolved -  Start colace, senna -  Lactulose prn  Diet:  Healthy heart Access:  PIV IVF:  off Proph:  heparin  Code Status: full Family Communication: patient alone  Disposition Plan:  Pending improvement in kidney function, heart catheterization, off dilt gtt, stable on warfarin.  Will likely have outpatient valve replacement.  Will need rehab.    Consultants:  Cardiology, Dr. Royann Shivers  Vascular surgery by phone, Dr. Zachery Dakins  Nephrology, Dr. Arrie Aran  Cardiothoracic surgery consult pending  Procedures:  KUB  CT angio abd/pelvis  ECHO  CXR  Planned:  [facility] once kidney function improved  Carotid duplex:  1-39% stenosis bilaterally  Vascular duplex LE:  Neg for DVT  Antibiotics:  Ceftriaxone 8/22 > 8/26  HPI/Subjective:  Left flank and left upper quadrant pain continue to improve.  She still feels very SOB with exertion.  No bowel movement yet.    Objective: Filed Vitals:   09/07/14 0854 09/07/14 1123 09/07/14 1315 09/07/14 1613  BP: 93/56  122/72   Pulse: 67     Temp:  98.2 F (36.8 C)  97.9 F (36.6 C)  TempSrc:  Oral  Oral  Resp: 21     Height:      Weight:      SpO2: 97%       Intake/Output Summary (Last 24 hours) at 09/07/14 2026 Last data filed at 09/07/14 1900  Gross per 24 hour  Intake 1333.33 ml  Output      0 ml  Net 1333.33 ml   Filed Weights   09/06/14 0600 09/07/14 0400 09/07/14 0735  Weight: 112.9 kg (248 lb 14.4 oz) 118.343 kg (260 lb 14.4 oz) 118.207 kg (260 lb 9.6 oz)   Body mass index is 52.61 kg/(m^2).  Exam:   General:  Adult female, No acute distress, ill-appearing.  Obese but cachectic around face  HEENT:  NCAT, MMM, JVP to preauricular space while mostly upright  Cardiovascular:  IRRR, 2/6 murmur, rate in the 70s  Respiratory:  Diminished bilateral breath sounds with rales at bases  Abdomen:   NABS, soft, nondistended, obese, tender to palpation in the left upper quadrant without  rebound or guarding  MSK:   Normal tone and bulk, 2+ LLE edema and 1+ RLE edema  Neuro:  Grossly intact  Data Reviewed: Basic Metabolic Panel:  Recent Labs Lab 09/03/14 1600 09/03/14 1740  09/04/14 0410 09/04/14 0750 09/05/14 0350 09/06/14 0712 09/07/14 0306  NA 137 137  --  133*  --  132* 128* 125*  K 5.3* 5.6*  < > 5.9* 5.8* 4.9 4.9 4.6  CL 104 105  --  103  --  104 99* 96*  CO2 22 22  --  18*  --  17* 19* 20*  GLUCOSE 158* 173*  --  177*  --  123* 108* 107*  BUN 21* 22*  --  20  --  24* 29* 32*  CREATININE 1.22* 1.25*  --  1.49*  --  1.91* 2.28* 2.38*  CALCIUM 8.9 8.9  --  8.7*  --  8.5* 8.5* 8.2*  MG 2.1  --   --   --   --   --   --   --  PHOS  --   --   --   --   --   --  4.0 4.4  < > = values in this interval not displayed. Liver Function Tests:  Recent Labs Lab 09/03/14 1600 09/06/14 0712 09/07/14 0306  AST 60*  --   --   ALT 69*  --   --   ALKPHOS 143*  --   --   BILITOT 1.0  --   --   PROT 6.3*  --   --   ALBUMIN 3.1* 2.4* 2.3*    Recent Labs Lab 09/03/14 1600  LIPASE 20*   No results for input(s): AMMONIA in the last 168 hours. CBC:  Recent Labs Lab 09/03/14 1600 09/04/14 0520 09/05/14 0350 09/06/14 0712 09/07/14 0306  WBC 10.7* 14.4* 26.0* 25.2* 23.2*  NEUTROABS 9.1*  --   --   --   --   HGB 13.4 13.5 13.3 12.5 12.0  HCT 40.1 41.0 40.3 37.0 35.8*  MCV 88.1 90.5 89.6 87.3 86.3  PLT 219 226 226 237 233    Recent Results (from the past 240 hour(s))  Urine culture     Status: None   Collection Time: 09/03/14  8:35 PM  Result Value Ref Range Status   Specimen Description URINE, CLEAN CATCH  Final   Special Requests NONE  Final   Culture   Final    MULTIPLE SPECIES PRESENT, SUGGEST RECOLLECTION Performed at [facility]    Report Status 09/05/2014 FINAL  Final  MRSA PCR Screening     Status: None   Collection Time: 09/03/14  9:27 PM  Result Value Ref Range Status   MRSA by PCR NEGATIVE NEGATIVE Final    Comment:         The GeneXpert MRSA Assay (FDA approved for NASAL specimens only), is one component of a comprehensive MRSA colonization surveillance program. It is not intended to diagnose MRSA infection nor to guide or monitor treatment for MRSA infections.   Urine culture     Status: None   Collection Time: 09/05/14  9:59 AM  Result Value Ref Range Status   Specimen Description URINE, RANDOM  Final   Special Requests ADDED [id number]  Final   Culture NO GROWTH 1 DAY  Final   Report Status 09/06/2014 FINAL  Final     Studies: Mr Brain Wo Contrast  09/07/2014   CLINICAL DATA:  Initial valuation for the increased gassiness, history of AFib, concern for stroke.  EXAM: MRI HEAD WITHOUT CONTRAST  TECHNIQUE: Multiplanar, multiecho pulse sequences of the brain and surrounding structures were obtained without intravenous contrast.  COMPARISON:  Prior study from 12/22/2004.  FINDINGS: No abnormal foci of restricted diffusion to suggest acute intracranial infarct. Gray-white matter differentiation maintained. Normal intravascular flow voids preserved. No acute or chronic intracranial hemorrhage.  Cerebral volume within normal limits for patient age. Minimal T2/FLAIR hyperintensity within the periventricular white matter, like related to chronic small vessel ischemic disease, felt to be within normal limits for patient age. No mass lesion, midline shift, or mass effect. No hydrocephalus. No extra-axial fluid collection.  Craniocervical junction within normal limits. Incidental note made of a partially empty sella. Pituitary gland otherwise unremarkable.  No acute abnormality about the orbits.  Paranasal sinuses are clear. No mastoid effusion. Inner ear structures grossly normal.  Bone marrow signal intensity normal. Scalp soft tissues within normal limits.  IMPRESSION: Negative brain MRI with no acute intracranial infarct or other process identified.   Electronically Signed  By: Rise Mu M.D.   On:  09/07/2014 04:43    Scheduled Meds: . diltiazem  90 mg Oral 3 times per day  . docusate sodium  100 mg Oral BID  . furosemide  40 mg Intravenous Q12H  . metoprolol tartrate  25 mg Oral BID  . mometasone-formoterol  2 puff Inhalation BID  . pantoprazole  40 mg Oral Daily  . pneumococcal 23 valent vaccine  0.5 mL Intramuscular Tomorrow-1000  . pravastatin  40 mg Oral q1800  . senna  2 tablet Oral QHS  . sodium chloride  3 mL Intravenous Q12H  . sodium chloride  3 mL Intravenous Q12H   Continuous Infusions: . [START ON 09/08/2014] sodium chloride 10 mL/hr at 09/07/14 0516  . 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 If 7PM-7AM, please contact night-coverage at [email or web address], password [facility] 09/07/2014, 8:26 PM  LOS: 4 days

## 2014-09-07 NOTE — Progress Notes (Signed)
Attempted to do PFT on pt. ,she was very tired an Administrator, [employer] an unable to perform breathing manuvers correctly. Will attempt to try again on Mon. 29 for better effort.

## 2014-09-07 NOTE — Progress Notes (Signed)
VASCULAR LAB PRELIMINARY  PRELIMINARY  PRELIMINARY  PRELIMINARY  Pre-op Cardiac Surgery  Carotid Findings:  Bilateral:  1-39% ICA stenosis.  Vertebral artery flow is antegrade.     Thereasa Parkin, RVT 09/07/2014 11:43 AM     Upper Extremity Right Left  Brachial Pressures    Radial Waveforms    Ulnar Waveforms pending   Palmar Arch (Allen's Test)     Findings:      Lower  Extremity Right Left  Dorsalis Pedis    Anterior Tibial pending   Posterior Tibial    Ankle/Brachial Indices      Findings:

## 2014-09-07 NOTE — Consult Note (Signed)
[address]       [place] [address]             [phone]          CARDIOTHORACIC SURGERY CONSULTATION REPORT  PCP is Delorse Lek, MD Referring Provider is Thurmon Fair, MD Primary Cardiologist is Cassell Clement, MD  Reason for consultation:  Severe mitral stenosis, moderate aortic stenosis, atrial fibrillation and left atrial thrombus  HPI:  Patient is a 59 year old morbidly obese African-American female with known history of severe mitral stenosis, moderate aortic stenosis, chronic dyspnea who was admitted to the hospital with acute exacerbation of chronic diastolic congestive heart failure and new onset persistent atrial fibrillation with 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 The patient states that she has been told that she had a heart murmur since childhood. She denies any known history of rheumatic fever.  She has been morbidly obese all of her life and reportedly took phentermine in the distant past.   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 She has a long history of shortness of breath, wheezing, and cough that has been attributed to reactive airway disease, pseudoasthma, GERD and obesity.  In September 2015 she was referred to Dr. Patty Sermons for formal cardiology evaluation and underwent an echocardiogram that revealed severe mitral stenosis (mean transvalvular gradient 18 mmHg) and moderate aortic stenosis (mean transvalvular gradient 27 mmHg).  She was in sinus rhythm at the time and reportedly claimed to have minimal symptoms of congestive heart failure. The likelihood that she might need surgical intervention in the future was discussed, but a decision was made to continue to  follow her closely. She was seen in follow-up by Dr. Patty Sermons on 05/15/2014 at which time she remained clinically stable.  Over the last 3 weeks the patient has developed fairly sudden onset of severe shortness of breath, productive cough, orthopnea, lower extremity edema.  She was seen in follow-up by Dr. Sherene Sires who prescribed anabiotic for presumed bronchitis on 08/28/2014.  Shortly after that she developed severe left-sided abdominal pain flank pain and back pain which ultimately prompted hospital admission on 09/03/2014.  She was found to be in atrial fibrillation with rapid ventricular response. 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 An echocardiogram was obtained that confirmed the presence of moderate aortic stenosis and severe mitral stenosis with clot in the left atrium.  There was normal left ventricular systolic function.  There was severe right ventricular dysfunction and moderate tricuspid regurgitation.  Cardiothoracic surgical consultation was requested.  The patient is married and lives in [place] with her husband and one of her daughters. She works full-time as a Arboriculturist. She has been morbidly obese all of her life. She lives a sedentary lifestyle when she is not at work but she has not experienced any significant physical limitations other than long-standing exertional shortness of breath. At the time of admission the patient had resting shortness of breath, orthopnea, and lower extremity edema. Her breathing has improved somewhat since admission. She has never had any chest pain or chest tightness. She denies any history of palpitations, dizzy spells, or syncope.  Past Medical History  Diagnosis Date  . Asthma   . Obesity   . Hypercholesteremia   .  Heart murmur   . Atrial fibrillation with rapid ventricular response 09/03/2014  . Bifascicular block 09/12/2013  . 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 Pure  hypercholesterolemia 09/12/2013  . Severe obesity (BMI >= 40) 09/12/2013  . 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 Chronic diastolic heart failure     Past Surgical History  Procedure Laterality Date  . Abdominal hysterectomy    . Knee arthroscopy      Family History  Problem Relation Age of Onset  . Stroke Mother   . Prostate cancer Father     Social History   Social History  . Marital Status: Divorced    Spouse Name: N/A  . Number of Children: 3  . Years of Education: N/A   Occupational History  . Not on file.   Social History Main Topics  . Smoking status: Never Smoker   . Smokeless tobacco: Never Used  . Alcohol Use: 0.0 oz/week    0 Standard drinks or equivalent per week     Comment: Rare  . Drug Use: No  . Sexual Activity: Not on file   Other Topics Concern  . Not on file   Social History Narrative    Prior to Admission medications   Medication Sig Start Date End Date Taking? Authorizing Provider  b complex vitamins capsule Take 1 capsule by mouth daily.   Yes Historical Provider, MD  benzonatate (TESSALON) 200 MG capsule Take 1 capsule (200 mg total) by mouth 3 (three) times daily as needed for cough. 08/31/14  Yes Lupita Leash, MD  cholecalciferol (VITAMIN D) 1000 UNITS tablet Take 2,000 Units by mouth daily.    Yes Historical Provider, MD  Coenzyme Q10 (COQ-10) 10 MG CAPS Take 1 capsule by mouth daily.   Yes Historical Provider, MD  mometasone-formoterol (DULERA) 100-5 MCG/ACT AERO Inhale 2 puffs into the lungs as needed for wheezing.   Yes Historical Provider, MD  OMEGA-3 FATTY ACIDS PO Take 1,600 mg by mouth every Monday, Wednesday, and Friday.    Yes Historical Provider, MD  pantoprazole (PROTONIX) 40 MG tablet Take 1 tablet (40 mg total) by mouth daily. Take 30-60 min before first meal of the day 08/28/14  Yes Nyoka Cowden, MD  pravastatin (PRAVACHOL) 40 MG tablet Take 40 mg by mouth daily.  09/11/13  Yes  Historical Provider, MD  predniSONE (DELTASONE) 20 MG tablet Take 20 mg by mouth 2 (two) times daily with a meal.  08/27/14  Yes Historical Provider, MD  Respiratory Therapy Supplies (FLUTTER) DEVI Use as directed 08/28/14   Nyoka Cowden, MD    Current Facility-Administered Medications  Medication Dose Route Frequency Provider Last Rate Last Dose  . 0.9 %  sodium chloride infusion  250 mL Intravenous PRN Mihai Croitoru, MD      . Melene Muller ON 09/08/2014] 0.9 %  sodium chloride infusion   Intravenous Continuous Mihai Croitoru, MD 10 mL/hr at 09/07/14 0516    . benzonatate (TESSALON) capsule 200 mg  200 mg Oral TID PRN Hillary Bow, DO   200 mg at 09/05/14 2104  . diltiazem (CARDIZEM) tablet 90 mg  90 mg Oral 3 times per day Thurmon Fair, MD   90 mg at 09/07/14 1315  . docusate sodium (COLACE) capsule 100 mg  100 mg Oral BID Renae Fickle, MD   100 mg at 09/07/14 0853  . furosemide (LASIX) injection 40 mg  40 mg Intravenous Q12H Mihai Croitoru, MD   40 mg  at 09/07/14 1318  . heparin ADULT infusion 100 units/mL (25000 units/250 mL)  1,900 Units/hr Intravenous Continuous Marlane Mingle Hiatt, RPH 19 mL/hr at 09/07/14 1800 1,900 Units/hr at 09/07/14 1800  . lactulose (CHRONULAC) 10 GM/15ML solution 30 g  30 g Oral Daily PRN Renae Fickle, MD      . metoprolol tartrate (LOPRESSOR) tablet 25 mg  25 mg Oral BID Thurmon Fair, MD   25 mg at 09/07/14 0854  . mometasone-formoterol (DULERA) 100-5 MCG/ACT inhaler 2 puff  2 puff Inhalation BID Hillary Bow, DO   2 puff at 09/06/14 1940  . morphine (MSIR) tablet 15-30 mg  15-30 mg Oral Q4H PRN Renae Fickle, MD   15 mg at 09/06/14 1007  . morphine 2 MG/ML injection 2-4 mg  2-4 mg Intravenous Q4H PRN Renae Fickle, MD      . ondansetron [facility]) injection 4 mg  4 mg Intravenous Q6H PRN Leda Gauze, NP   4 mg at 09/04/14 2058  . pantoprazole (PROTONIX) EC tablet 40 mg  40 mg Oral Daily Hillary Bow, DO   40 mg at 09/07/14 0853  . pneumococcal  23 valent vaccine (PNU-IMMUNE) injection 0.5 mL  0.5 mL Intramuscular Tomorrow-1000 Clydia Llano, MD   0.5 mL at 09/05/14 1106  . pravastatin (PRAVACHOL) tablet 40 mg  40 mg Oral q1800 Hillary Bow, DO   40 mg at 09/07/14 1710  . senna (SENOKOT) tablet 17.2 mg  2 tablet Oral QHS Renae Fickle, MD   17.2 mg at 09/06/14 2300  . sodium chloride 0.9 % injection 3 mL  3 mL Intravenous Q12H Hillary Bow, DO   3 mL at 09/05/14 2242  . sodium chloride 0.9 % injection 3 mL  3 mL Intravenous Q12H Mihai Croitoru, MD   3 mL at 09/07/14 0854  . sodium chloride 0.9 % injection 3 mL  3 mL Intravenous PRN Thurmon Fair, MD        Allergies  Allergen Reactions  . Codeine Itching      Review of Systems:   General:  normal appetite, decreased energy, + weight gain, + weight loss, no fever  Cardiac:  no chest pain with exertion, no chest pain at rest, + SOB with exertion, + resting SOB, no PND, + orthopnea, no palpitations, + arrhythmia, + atrial fibrillation, + LE edema, no dizzy spells, no syncope  Respiratory:  + shortness of breath, no home oxygen, + productive cough, + dry cough, + bronchitis, + wheezing, no hemoptysis, + asthma, no pain with inspiration or cough, no sleep apnea, no CPAP at night  GI:   no difficulty swallowing, no reflux, no frequent heartburn, no hiatal hernia, no abdominal pain, + constipation, no diarrhea, no hematochezia, no hematemesis, no melena  GU:   + dysuria,  no frequency, + urinary tract infection, no hematuria, no kidney stones, no kidney disease  Vascular:  no pain suggestive of claudication, no pain in feet, no leg cramps, no varicose veins, no DVT, no non-healing foot ulcer  Neuro:   no stroke, no TIA's, no seizures, no headaches, no temporary blindness one eye,  no slurred speech, no peripheral neuropathy, no chronic pain, no instability of gait, no memory/cognitive dysfunction  Musculoskeletal: + arthritis - primarily involving the knees, no joint swelling, no  myalgias, no difficulty walking, normal mobility   Skin:   no rash, no itching, no skin infections, no pressure sores or ulcerations  Psych:   no anxiety, no depression, no nervousness,  no unusual recent stress  Eyes:   no blurry vision, no floaters, no recent vision changes, + wears glasses for distance  ENT:   no hearing loss, no loose or painful teeth, no dentures, last saw dentist within 6 months  Hematologic:  no easy bruising, no abnormal bleeding, no clotting disorder, no frequent epistaxis  Endocrine:  no diabetes, does not check CBG's at home     Physical Exam:   BP 122/72 mmHg  Pulse 67  Temp(Src) 97.9 F (36.6 C) (Oral)  Resp 21  Ht 4\' 11"  (1.499 m)  Wt 118.207 kg (260 lb 9.6 oz)  BMI 52.61 kg/m2  SpO2 97%  General:  Morbidly obese but o/w  well-appearing  HEENT:  Unremarkable   Neck:   no JVD, no bruits, no adenopathy   Chest:   clear to auscultation, symmetrical breath sounds, no wheezes, no rhonchi   CV:   RRR, grade III/VI systolic murmur best along sternal border  Abdomen:  soft, non-tender, no masses   Extremities:  warm, well-perfused, pulses not palpable, + lower extremity edema  Rectal/GU  Deferred  Neuro:   Grossly non-focal and symmetrical throughout  Skin:   Clean and dry, no rashes, no breakdown  Diagnostic Tests:  Transthoracic Echocardiography  Patient:  Delayla, Hoffmaster MR #:    [id number] Study Date: 09/28/2013 Gender:   F Age:    73 Height:   152.4 cm Weight:   112.5 kg BSA:    2.26 m^2 Pt. Status: Room:  ATTENDING  Cassell Clement ORDERING   Maisie Fus Brackbill REFERRING  Cassell Clement SONOGRAPHER Aida Raider, RDCS PERFORMING  [facility]  cc:  ------------------------------------------------------------------- LV EF: 60% -  65%  ------------------------------------------------------------------- Indications:   Murmur  785.2.  ------------------------------------------------------------------- History:  PMH: Acquired from the patient and from the patient&'s chart. PMH: Shortness of Breath. Murmur. Risk factors: Morbidly obese. Hypercholesterolemia.  ------------------------------------------------------------------- Study Conclusions  - Left ventricle: The cavity size was normal. Wall thickness was increased in a pattern of moderate LVH. Systolic function was normal. The estimated ejection fraction was in the range of 60% to 65%. Wall motion was normal; there were no regional wall motion abnormalities. Doppler parameters are consistent with high ventricular filling pressure. - Aortic valve: Valve mobility was restricted. There was moderate stenosis. There was trivial regurgitation. - Mitral valve: Calcified annulus. Severe calcification, consistent with rheumatic disease. Mobility was restricted. The findings are consistent with severe stenosis. - Left atrium: The atrium was moderately to severely dilated. - Pulmonary arteries: Systolic pressure was moderately increased.  Impressions:  - Normal LV function; elevated left ventricular filling pressures; moderate to severe LAE; calcified, rheumatic MV with severe MS (mean gradient 20 mmHg); calcified aortic valve with moderate AS and trace AI; moderately elevated pulmonary pressures.  Transthoracic echocardiography. M-mode, complete 2D, spectral Doppler, and color Doppler. Birthdate: Patient birthdate: 1955-08-04. Age: Patient is 59 yr old. Sex: Gender: female. BMI: 48.4 kg/m^2. Blood pressure:   128/90 Patient status: Outpatient. Study date: Study date: 09/28/2013. Study time: 11:07 AM. Location: [place] [facility]  -------------------------------------------------------------------  ------------------------------------------------------------------- Left ventricle: The cavity size was normal. Wall  thickness was increased in a pattern of moderate LVH. Systolic function was normal. The estimated ejection fraction was in the range of 60% to 65%. Wall motion was normal; there were no regional wall motion abnormalities. Doppler parameters are consistent with high ventricular filling pressure.  ------------------------------------------------------------------- Aortic valve:  Trileaflet; severely calcified leaflets. Valve mobility was restricted. Doppler:  There was moderate  stenosis. There was trivial regurgitation.  VTI ratio of LVOT to aortic valve: 0.56. Valve area (VTI): 1.46 cm^2. Indexed valve area (VTI): 0.65 cm^2/m^2. Peak velocity ratio of LVOT to aortic valve: 0.43. Valve area (Vmax): 1.36 cm^2. Indexed valve area (Vmax): 0.6 cm^2/m^2. Mean velocity ratio of LVOT to aortic valve: 0.53. Valve area (Vmean): 1.65 cm^2. Indexed valve area (Vmean): 0.73 cm^2/m^2.  Mean gradient (S): 27 mm Hg. Peak gradient (S): 44 mm Hg.  ------------------------------------------------------------------- Aorta: Aortic root: The aortic root was normal in size.  ------------------------------------------------------------------- Mitral valve:  Calcified annulus. Severe calcification, consistent with rheumatic disease. Mobility was restricted. Doppler:  The findings are consistent with severe stenosis. There was no regurgitation.  Valve area by pressure half-time: 1.21 cm^2. Indexed valve area by pressure half-time: 0.54 cm^2/m^2. Valve area by continuity equation (using LVOT flow): 0.88 cm^2. Indexed valve area by continuity equation (using LVOT flow): 0.39 cm^2/m^2.  Mean gradient (D): 18 mm Hg. Peak gradient (D): 26 mm Hg.  ------------------------------------------------------------------- Left atrium: The atrium was moderately to severely dilated.  ------------------------------------------------------------------- Right ventricle: The cavity size was normal. Systolic  function was normal.  ------------------------------------------------------------------- Pulmonic valve:  Doppler: Transvalvular velocity was within the normal range. There was no evidence for stenosis.  ------------------------------------------------------------------- Tricuspid valve:  Structurally normal valve.  Doppler: Transvalvular velocity was within the normal range. There was mild regurgitation.  ------------------------------------------------------------------- Pulmonary artery:  Systolic pressure was moderately increased.  ------------------------------------------------------------------- Right atrium: The atrium was normal in size.  ------------------------------------------------------------------- Pericardium: There was no pericardial effusion.  ------------------------------------------------------------------- Systemic veins: Inferior vena cava: The vessel was normal in size.  ------------------------------------------------------------------- Measurements  Left ventricle              Value     Reference LV ID, ED, PLAX chordal      (L)   30.9 mm    43 - 52 LV ID, ES, PLAX chordal      (L)   20.8 mm    23 - 38 LV fx shortening, PLAX chordal      33  %    >=29 LV PW thickness, ED            15.5 mm    --------- IVS/LV PW ratio, ED            1.06      <=1.3 Stroke volume, 2D             100  ml    --------- Stroke volume/bsa, 2D           44  ml/m^2  --------- LV e&', lateral              4.74 cm/s   --------- LV E/e&', lateral             53.59     --------- LV e&', medial               3.91 cm/s   --------- LV E/e&', medial              64.96     --------- LV e&', average              4.33 cm/s   --------- LV E/e&', average              58.73     ---------  Ventricular septum            Value     Reference IVS thickness, ED  16.5 mm    ---------  LVOT                   Value     Reference LVOT ID, S                20  mm    --------- LVOT area                 3.14 cm^2   --------- LVOT peak velocity, S           143  cm/s   --------- LVOT mean velocity, S           109  cm/s   --------- LVOT VTI, S                31.9 cm    --------- LVOT peak gradient, S           8   mm Hg  ---------  Aortic valve               Value     Reference Aortic valve peak velocity, S       330  cm/s   --------- Aortic valve mean velocity, S       207  cm/s   --------- Aortic valve VTI, S            56.7 cm    --------- Aortic mean gradient, S          27  mm Hg  --------- Aortic peak gradient, S          44  mm Hg  --------- VTI ratio, LVOT/AV            0.56      --------- Aortic valve area, VTI          1.46 cm^2   --------- Aortic valve area/bsa, VTI        0.65 cm^2/m^2 --------- Velocity ratio, peak, LVOT/AV       0.43      --------- Aortic valve area, peak velocity     1.36 cm^2   --------- Aortic valve area/bsa, peak        0.6  cm^2/m^2 --------- velocity Velocity ratio, mean, LVOT/AV       0.53      --------- Aortic valve area, mean velocity     1.65 cm^2   --------- Aortic valve area/bsa, mean        0.73 cm^2/m^2 --------- velocity Aortic regurg pressure half-time     400  ms    ---------  Aorta                   Value     Reference Aortic root ID, ED            27  mm    ---------  Left  atrium                Value     Reference LA ID, A-P, ES              48  mm    --------- LA ID/bsa, A-P              2.13 cm/m^2  <=2.2  Mitral valve               Value     Reference Mitral E-wave peak velocity        254  cm/s   --------- Mitral  A-wave peak velocity        234  cm/s   --------- Mitral mean velocity, D          205  cm/s   --------- Mitral deceleration time     (H)   616  ms    150 - 230 Mitral pressure half-time         179  ms    --------- Mitral mean gradient, D          18  mm Hg  --------- Mitral peak gradient, D          26  mm Hg  --------- Mitral E/A ratio, peak          1.1      --------- Mitral valve area, PHT, DP        1.21 cm^2   --------- Mitral valve area/bsa, PHT, DP      0.54 cm^2/m^2 --------- Mitral valve area, LVOT          0.88 cm^2   --------- continuity Mitral valve area/bsa, LVOT        0.39 cm^2/m^2 --------- continuity Mitral annulus VTI, D           99.6 cm    ---------  Tricuspid valve              Value     Reference Tricuspid regurg peak velocity      341  cm/s   --------- Tricuspid peak RV-RA gradient       47  mm Hg  --------- Tricuspid maximal regurg         341  cm/s   --------- velocity, PISA  Right ventricle              Value     Reference RV s&', lateral, S             11  cm/s   ---------  Legend: (L) and (H) mark values outside specified reference range.  ------------------------------------------------------------------- Prepared and Electronically Authenticated by  Olga Millers 2015-09-17T15:13:23   Transthoracic Echocardiography  Patient:  Zykera, Abella MR #:     [id number] Study Date: 09/04/2014 Gender:   F Age:    48 Height:   149.9 cm Weight:   109.3 kg BSA:    2.21 m^2 Pt. Status: Room:    3W04C  ATTENDING  Billee Cashing SONOGRAPHER [address] REFERRING  Hillary Bow PERFORMING  [facility] ADMITTING  Bobette Mo  cc:  ------------------------------------------------------------------- LV EF: 50% -  55%  ------------------------------------------------------------------- Indications:   Atrial fibrillation - 427.31.  ------------------------------------------------------------------- History:  PMH: Left Atrial Thrombus. Mitral Stenosis. Dyspnea and murmur. Atrial fibrillation.  ------------------------------------------------------------------- Study Conclusions  - Left ventricle: The cavity size was normal. Wall thickness was increased in a pattern of moderate LVH. Systolic function was normal. The estimated ejection fraction was in the range of 50% to 55%. Wall motion was normal; there were no regional wall motion abnormalities. - Ventricular septum: The contour showed diastolic flattening. - Aortic valve: There was mild to moderate stenosis. There was trivial regurgitation. Valve area (VTI): 1.38 cm^2. Valve area (Vmax): 1.52 cm^2. Valve area (Vmean): 1.41 cm^2. - Mitral valve: The findings are consistent with severe stenosis. Valve area by continuity equation (using LVOT flow): 1.15 cm^2. - Left atrium: The atrium was mildly dilated. There was a largethrombus. - Right ventricle: Systolic function was severely reduced. - Right atrium: The atrium was moderately  dilated. - Tricuspid valve: There was moderate regurgitation. - Pulmonary arteries: Systolic pressure was moderately increased. PA peak pressure: 64 mm Hg (S).  Transthoracic echocardiography. M-mode, complete 2D, spectral Doppler, and color  Doppler. Birthdate: Patient birthdate: 08-Feb-1955. Age: Patient is 59 yr old. Sex: Gender: female. BMI: 48.7 kg/m^2. Blood pressure:   148/106 Patient status: Inpatient. Study date: Study date: 09/04/2014. Study time: 04:15 PM. Location: Bedside.  -------------------------------------------------------------------  ------------------------------------------------------------------- Left ventricle: The cavity size was normal. Wall thickness was increased in a pattern of moderate LVH. Systolic function was normal. The estimated ejection fraction was in the range of 50% to 55%. Wall motion was normal; there were no regional wall motion abnormalities.  ------------------------------------------------------------------- Aortic valve:  Mildly thickened, moderately calcified leaflets. Doppler:  There was mild to moderate stenosis.  There was trivial regurgitation.  VTI ratio of LVOT to aortic valve: 0.44. Valve area (VTI): 1.38 cm^2. Indexed valve area (VTI): 0.63 cm^2/m^2. Peak velocity ratio of LVOT to aortic valve: 0.48. Valve area (Vmax): 1.52 cm^2. Indexed valve area (Vmax): 0.69 cm^2/m^2. Mean velocity ratio of LVOT to aortic valve: 0.45. Valve area (Vmean): 1.41 cm^2. Indexed valve area (Vmean): 0.64 cm^2/m^2.  Mean gradient (S): 15 mm Hg. Peak gradient (S): 26 mm Hg.  ------------------------------------------------------------------- Aorta: Aortic root: The aortic root was normal in size. Ascending aorta: The ascending aorta was normal in size.  ------------------------------------------------------------------- Mitral valve:  Severely thickened, severely calcified leaflets . Doppler:  The findings are consistent with severe stenosis. Valve area by continuity equation (using LVOT flow): 1.15 cm^2. Indexed valve area by continuity equation (using LVOT flow): 0.52 cm^2/m^2.  Mean gradient (D): 18 mm  Hg.  ------------------------------------------------------------------- Left atrium: There is a large mass in the left atrium that appears to be a large thrombus. This thrombus was not present during her previous echo in Nov. 2015. The atrium was mildly dilated. There was a largethrombus.  ------------------------------------------------------------------- Right ventricle: The cavity size was normal. Systolic function was severely reduced.  ------------------------------------------------------------------- Ventricular septum:  The contour showed diastolic flattening.  ------------------------------------------------------------------- Pulmonic valve:  The valve appears to be grossly normal. Doppler: There was no significant regurgitation.  ------------------------------------------------------------------- Tricuspid valve:  Structurally normal valve.  Leaflet separation was normal. Doppler: Transvalvular velocity was within the normal range. There was moderate regurgitation.  ------------------------------------------------------------------- Pulmonary artery:  Systolic pressure was moderately increased.  ------------------------------------------------------------------- Right atrium: The atrium was moderately dilated.  ------------------------------------------------------------------- Pericardium: There was no pericardial effusion.  ------------------------------------------------------------------- Systemic veins: Inferior vena cava: The vessel was dilated. The respirophasic diameter changes were blunted (< 50%), consistent with elevated central venous pressure.  ------------------------------------------------------------------- Measurements  Left ventricle              Value     Reference LV ID, ED, PLAX chordal      (L)   22.7 mm    43 - 52 LV ID, ES, PLAX chordal      (L)   17.1 mm    23 - 38 LV fx  shortening, PLAX chordal  (L)   25  %    >=29 LV PW thickness, ED            15.5 mm    --------- IVS/LV PW ratio, ED            0.95      <=1.3 Stroke volume, 2D             60  ml    --------- Stroke volume/bsa, 2D  27  ml/m^2  --------- LV e&', lateral              5.21 cm/s   ---------  Ventricular septum            Value     Reference IVS thickness, ED             14.7 mm    ---------  LVOT                   Value     Reference LVOT ID, S                20  mm    --------- LVOT area                 3.14 cm^2   --------- LVOT peak velocity, S           124  cm/s   --------- LVOT mean velocity, S           81.1 cm/s   --------- LVOT VTI, S                19.2 cm    --------- LVOT peak gradient, S           6   mm Hg  ---------  Aortic valve               Value     Reference Aortic valve peak velocity, S       257  cm/s   --------- Aortic valve mean velocity, S       181  cm/s   --------- Aortic valve VTI, S            43.7 cm    --------- Aortic mean gradient, S          15  mm Hg  --------- Aortic peak gradient, S          26  mm Hg  --------- VTI ratio, LVOT/AV            0.44      --------- Aortic valve area, VTI          1.38 cm^2   --------- Aortic valve area/bsa, VTI        0.63 cm^2/m^2 --------- Velocity ratio, peak, LVOT/AV       0.48      --------- Aortic valve area, peak velocity     1.52 cm^2   --------- Aortic valve area/bsa, peak        0.69 cm^2/m^2 --------- velocity Velocity ratio, mean, LVOT/AV       0.45      --------- Aortic  valve area, mean velocity     1.41 cm^2   --------- Aortic valve area/bsa, mean        0.64 cm^2/m^2 --------- velocity Aortic regurg pressure half-time     336  ms    ---------  Aorta                   Value     Reference Aortic root ID, ED            26  mm    ---------  Left atrium                Value     Reference LA ID, A-P, ES              41  mm    --------- LA ID/bsa,  A-P              1.86 cm/m^2  <=2.2 LA volume, S               73  ml    --------- LA volume/bsa, S             33.1 ml/m^2  --------- LA volume, ES, 1-p A4C          55  ml    --------- LA volume/bsa, ES, 1-p A4C        24.9 ml/m^2  --------- LA volume, ES, 1-p A2C          87  ml    --------- LA volume/bsa, ES, 1-p A2C        39.4 ml/m^2  ---------  Mitral valve               Value     Reference Mitral mean velocity, D          201  cm/s   --------- Mitral mean gradient, D          18  mm Hg  --------- Mitral valve area, LVOT          1.15 cm^2   --------- continuity Mitral valve area/bsa, LVOT        0.52 cm^2/m^2 --------- continuity Mitral annulus VTI, D           52.2 cm    ---------  Pulmonary arteries            Value     Reference PA pressure, S, DP        (H)   64  mm Hg  <=30  Tricuspid valve              Value     Reference Tricuspid regurg peak velocity      391  cm/s   --------- Tricuspid peak RV-RA gradient       61  mm Hg  ---------  Systemic veins              Value     Reference Estimated CVP               3   mm Hg  ---------  Right ventricle               Value     Reference RV pressure, S, DP        (H)   64  mm Hg  <=30  Legend: (L) and (H) mark values outside specified reference range.  ------------------------------------------------------------------- Prepared and Electronically Authenticated by  Kristeen Miss, M.D. 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 She presents with fairly recent acute exacerbation of symptoms of chronic diastolic congestive heart failure that developed in the setting of new onset atrial fibrillation.  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 I have personally reviewed the patient's most recent transthoracic echocardiogram. The patient's mitral valve is severely thickened and partially calcified with extremely restricted leaflet motion, all consistent with likely underlying rheumatic disease. The patient also has probably moderate aortic stenosis with mild thickening and restricted leaflet mobility involving all 3 leaflets of the aortic valve. There is only trace aortic insufficiency. Left ventricular systolic function is normal but there is severe right  ventricular dysfunction and moderate tricuspid regurgitation.  There is no question that she will need surgery, but I do not feel it would be wise to proceed in the immediate future because of the presence of left atrial thrombus and recent acute thromboembolic infarction of the left kidney.    Plan:  I agree with plans for diagnostic left and right heart catheterization when the patient's renal function has stabilized. I agree with plans for treating her atrial fibrillation with rate control and anticoagulation.   I would favor keeping her on therapeutic  warfarin anticoagulation for at least 4-6 weeks as long as her heart failure can be stabilized medically.  Follow-up transesophageal echocardiogram could be performed to verify resolution of the left atrial thrombus.   At some point prior to surgery I would plan to load her with amiodarone to improve her chances for maintaining sinus rhythm postoperatively.  We will continue to follow along while the patient is in the hospital.  I discussed matters at length with the patient and her 2 daughters at the bedside this evening. All of their questions have been answered.   I spent in excess of 120 minutes during the conduct of this hospital consultation and >50% of this time involved direct face-to-face encounter for counseling and/or coordination of the patient's care.    Salvatore Decent. Cornelius Moras, MD 09/07/2014 7:08 PM

## 2014-09-07 NOTE — Progress Notes (Signed)
ANTICOAGULATION CONSULT NOTE - Follow Up Consult  Pharmacy Consult for Heparin Indication: Atrial clot and infarcted kidney  Allergies  Allergen Reactions  . Codeine Itching    Patient Measurements: Height:  (149.9 cm) Weight: 248 lb 14.4 oz (112.9 kg) IBW/kg (Calculated) : 43.2 Heparin Dosing Weight: 70 kg  Vital Signs: Temp: 97.6 F (36.4 C) (08/26 0020) Temp Source: Oral (08/26 0020) BP: 117/79 mmHg (08/25 2300) Pulse Rate: 78 (08/25 2300)  Labs:  Recent Labs  09/04/14 0410  09/05/14 0350  09/06/14 0712 09/06/14 1902 09/07/14 0306  HGB  --   < > 13.3  --  12.5  --  12.0  HCT  --   < > 40.3  --  37.0  --  35.8*  PLT  --   < > 226  --  237  --  233  HEPARINUNFRC  --   < > 0.29*  < > 0.24* 0.16* 0.32  CREATININE 1.49*  --  1.91*  --  2.28*  --   --   < > = values in this interval not displayed.  Estimated Creatinine Clearance: 30.2 mL/min (by C-G formula based on Cr of 2.28).  Assessment: 59yo female with atrial clot and infarcted kidney continues on IV heparin. Heparin level 0.32 (therapeutic) on 1900 units/hr. No bleeding noted. Awaiting MRI results of brain to r/o stroke.  Goal of Therapy:  Heparin level 0.3-0.7 units/ml Monitor platelets by anticoagulation protocol: Yes   Plan:  Continue heparin at 1900 units/hr  Will f/u 6 hr heparin level to confirm therapeutic Daily HL and CBC  Christoper Fabian, PharmD, BCPS Clinical pharmacist, pager [phone] 09/07/2014 4:01 AM

## 2014-09-07 NOTE — Progress Notes (Signed)
Rehab Admissions Coordinator Note:  Patient was screened by Clois Dupes for appropriateness for an Inpatient Acute Rehab Consult per PT and OT recommendations.  At this time, we are recommending await further medical workup and plan. [employer] unliklely to approve an inpt rehab admission for current diagnosis. I will follow.Clois Dupes 09/07/2014, 9:42 AM  I can be reached at [phone].

## 2014-09-07 NOTE — Progress Notes (Signed)
Subjective: No complaints, but does not appear well.  Not understanding her condition very well.  Objective: Vital signs in last 24 hours: Temp:  [97.6 F (36.4 C)-98.4 F (36.9 C)] 98.4 F (36.9 C) (08/26 0735) Pulse Rate:  [68-79] 79 (08/26 0300) Resp:  [18-20] 18 (08/26 0300) BP: (108-117)/(73-89) 115/73 mmHg (08/26 0300) SpO2:  [91 %-98 %] 98 % (08/26 0300) Weight:  [260 lb 14.4 oz (118.343 kg)] 260 lb 14.4 oz (118.343 kg) (08/26 0400) Weight change: 12 lb (5.443 kg) Last BM Date:  (patient reports its been a while; been on liquid diet) Intake/Output from previous day: +472 08/25 0701 - 08/26 0700 In: 975.3 [P.O.:680; I.V.:245.3; IV Piggyback:50] Out: 300 [Urine:300] Intake/Output this shift:    PE: General:Pleasant affect, SOB with talking Skin:cool and moist, brisk capillary refill HEENT:normocephalic, sclera clear, mucus membranes moist Neck:supple, + JVD  Heart:irreg irreg  With + murmur,no gallup, rub or click Lungs:diminished without rales, rhonchi, or wheezes ZOX:WRUEA, soft, non tender, + BS, do not palpate liver spleen or masses Ext:no lower ext edema, 2+ pedal pulses, 2+ radial pulses Neuro:alert and oriented X 3, MAE, follows commands, + facial symmetry Tele: a flutter rate controlled  Lab Results:  Recent Labs  09/06/14 0712 09/07/14 0306  WBC 25.2* 23.2*  HGB 12.5 12.0  HCT 37.0 35.8*  PLT 237 233   BMET  Recent Labs  09/06/14 0712 09/07/14 0306  NA 128* 125*  K 4.9 4.6  CL 99* 96*  CO2 19* 20*  GLUCOSE 108* 107*  BUN 29* 32*  CREATININE 2.28* 2.38*  CALCIUM 8.5* 8.2*   No results for input(s): TROPONINI in the last 72 hours.  Invalid input(s): CK, MB  No results found for: CHOL, HDL, LDLCALC, LDLDIRECT, TRIG, CHOLHDL No results found for: [id number]   Lab Results  Component Value Date   TSH 1.978 09/04/2014    Hepatic Function Panel  Recent Labs  09/07/14 0306  ALBUMIN 2.3*   No results for input(s): CHOL in  the last 72 hours. No results for input(s): PROTIME in the last 72 hours.     Studies/Results: Mr Sherrin Daisy Contrast  09/07/2014   CLINICAL DATA:  Initial valuation for the increased gassiness, history of AFib, concern for stroke.  EXAM: MRI HEAD WITHOUT CONTRAST  TECHNIQUE: Multiplanar, multiecho pulse sequences of the brain and surrounding structures were obtained without intravenous contrast.  COMPARISON:  Prior study from 12/22/2004.  FINDINGS: No abnormal foci of restricted diffusion to suggest acute intracranial infarct. Gray-white matter differentiation maintained. Normal intravascular flow voids preserved. No acute or chronic intracranial hemorrhage.  Cerebral volume within normal limits for patient age. Minimal T2/FLAIR hyperintensity within the periventricular white matter, like related to chronic small vessel ischemic disease, felt to be within normal limits for patient age. No mass lesion, midline shift, or mass effect. No hydrocephalus. No extra-axial fluid collection.  Craniocervical junction within normal limits. Incidental note made of a partially empty sella. Pituitary gland otherwise unremarkable.  No acute abnormality about the orbits.  Paranasal sinuses are clear. No mastoid effusion. Inner ear structures grossly normal.  Bone marrow signal intensity normal. Scalp soft tissues within normal limits.  IMPRESSION: Negative brain MRI with no acute intracranial infarct or other process identified.   Electronically Signed   By: Rise Mu M.D.   On: 09/07/2014 04:43   Dg Chest Port 1 View  09/05/2014   CLINICAL DATA:  Follow-up of elevated white blood cell count  EXAM: PORTABLE CHEST - 1 VIEW  COMPARISON:  Chest x-ray of September 03, 2014  FINDINGS: The patient is positioned in a lordotic matter today which limits the apparent expansion of the lungs. The interstitial markings remain increased and there is subsegmental atelectasis at the right lung base. There is a small amount of  fluid in the minor fissure. The cardiac silhouette remains enlarged. The pulmonary vascularity is more conspicuous today. The bony thorax exhibits no acute abnormality.  IMPRESSION: CHF with mild interstitial edema. Subsegmental atelectasis at the right lung base. When the patient can tolerate the procedure, a PA and lateral chest x-ray would be useful.   Electronically Signed   By: David  [place] M.D.   On: 09/05/2014 08:17    Medications: I have reviewed the patient's current medications. Scheduled Meds: . diltiazem  90 mg Oral 3 times per day  . docusate sodium  100 mg Oral BID  . metoprolol tartrate  25 mg Oral BID  . mometasone-formoterol  2 puff Inhalation BID  . pantoprazole  40 mg Oral Daily  . pneumococcal 23 valent vaccine  0.5 mL Intramuscular Tomorrow-1000  . pravastatin  40 mg Oral q1800  . senna  2 tablet Oral QHS  . sodium chloride  3 mL Intravenous Q12H  . sodium chloride  3 mL Intravenous Q12H   Continuous Infusions: . [START ON 09/08/2014] sodium chloride 10 mL/hr at 09/07/14 0516  . heparin 1,900 Units/hr (09/06/14 1039)   PRN Meds:.sodium chloride, benzonatate, lactulose, morphine, morphine injection, ondansetron, sodium chloride  Assessment/Plan: 59 year old female with past medical history of mitral stenosis, aortic stenosis for evaluation of new-onset atrial fibrillation. Patient denies history of rheumatic fever. Echocardiogram September 2015 showed normal LV function, elevated left ventricular filling pressures, moderate aortic stenosis with mean gradient 27 mmHg, trace aortic insufficiency, severe mitral stenosis with mean gradient 20 mmHg and moderately elevated pulmonary pressures, now EF 50-55% mild to mod AS and severe MS, mod TR and large atrial thrombus.   Principal Problem:   Atrial fibrillation with rapid ventricular response Active Problems:   Renal infarction   Hyperkalemia   Left atrial thrombus   Acute renal artery occlusion   Mitral valve stenosis  and aortic valve stenosis   Rheumatic mitral stenosis   Rheumatic aortic stenosis   Chronic diastolic heart failure   Acute cystitis without hematuria   Lower extremity edema   Rate is stable, on lopressor 25 bid and po dilt 90 mg TID, BP mildly depressed.  Lethargy yesterday, MRI without acute process   Acute kidney injury, CKD 4 Cr today 2.38 with new Lt renal infarction.  Cath held for today. Ok to eat.   +leukocytosis mild decrease in WBC, most likely due to renal infarct, ? Component of UTI  No fever.  UTI, + nitrites,on the 24th  rec'd 4 days of rocephin prior to the U/A- add cipro 250 mg BID (culture pending) Vs continue rocephin. Will leave to IM  Hyponatremia Na 125    Cath postponed today due to elevated Cr.   Diastolic Heart failure.  With 20 lb wt gain, different scales are being used.  I & O + 1621 since admit. I have asked nursing staff to use same scales each day.  Pt not understanding what is happening to her, most likely due to severity of illness..      LOS: 4 days   Time spent with pt. : 15 minutes. [facility] R  Nurse Practitioner Certified Pager [phone] or after 5pm  and on weekends call [phone] 09/07/2014, 7:56 AM   I have seen and examined the patient along with [facility] R, NP.  I have reviewed the chart, notes and new data.  I agree with NP's note.  Key new complaints: some confusion (hyponatremia related?) , still with moderate orthopnea Key examination changes: JVP to jaw, reduced breath sounds in bases, no moist rales Key new findings / data: Na 125, creat slightly higher but has probably reached a plateau  PLAN: She is markedly hypervolemic and her hyponatremia is heart failure related. Fluid restriction. Gradual diuresis over the weekend with a plan for R/L heart cath early next week.  Thurmon Fair, MD, [facility] [phone] 09/07/2014, 10:05 AM

## 2014-09-07 NOTE — Progress Notes (Signed)
Patient ID: [name] [name] [name], female   DOB: January 31, 1955, 59 y.o.   MRN: [id number] S:feels better today O:BP 93/56 mmHg  Pulse 67  Temp(Src) 98.4 F (36.9 C) (Oral)  Resp 18  Ht 4\' 11"  (1.499 m)  Wt 118.207 kg (260 lb 9.6 oz)  BMI 52.61 kg/m2  SpO2 98%  Intake/Output Summary (Last 24 hours) at 09/07/14 0857 Last data filed at 09/07/14 0854  Gross per 24 hour  Intake 978.33 ml  Output    300 ml  Net 678.33 ml   Intake/Output: I/O last 3 completed shifts: In: 1775.3 [P.O.:1040; I.V.:635.3; IV Piggyback:100] Out: 950 [Urine:950]  Intake/Output this shift:  Total I/O In: 3 [I.V.:3] Out: -  Weight change: 5.443 kg (12 lb) Gen:WD obese AAF in NAD CVS:IRR IRR, + systolic murmer at LUSB and apex, no rub Resp:decreased BS at bases WUJ:WJXBJ +BS, soft Ext:1+ edema   Recent Labs Lab 09/03/14 1600 09/03/14 1740 09/04/14 0009 09/04/14 0410 09/04/14 0750 09/05/14 0350 09/06/14 0712 09/07/14 0306  NA 137 137  --  133*  --  132* 128* 125*  K 5.3* 5.6* 5.4* 5.9* 5.8* 4.9 4.9 4.6  CL 104 105  --  103  --  104 99* 96*  CO2 22 22  --  18*  --  17* 19* 20*  GLUCOSE 158* 173*  --  177*  --  123* 108* 107*  BUN 21* 22*  --  20  --  24* 29* 32*  CREATININE 1.22* 1.25*  --  1.49*  --  1.91* 2.28* 2.38*  ALBUMIN 3.1*  --   --   --   --   --  2.4* 2.3*  CALCIUM 8.9 8.9  --  8.7*  --  8.5* 8.5* 8.2*  PHOS  --   --   --   --   --   --  4.0 4.4  AST 60*  --   --   --   --   --   --   --   ALT 69*  --   --   --   --   --   --   --    Liver Function Tests:  Recent Labs Lab 09/03/14 1600 09/06/14 0712 09/07/14 0306  AST 60*  --   --   ALT 69*  --   --   ALKPHOS 143*  --   --   BILITOT 1.0  --   --   PROT 6.3*  --   --   ALBUMIN 3.1* 2.4* 2.3*    Recent Labs Lab 09/03/14 1600  LIPASE 20*   No results for input(s): AMMONIA in the last 168 hours. CBC:  Recent Labs Lab 09/03/14 1600 09/04/14 0520 09/05/14 0350 09/06/14 0712 09/07/14 0306  WBC 10.7* 14.4* 26.0* 25.2*  23.2*  NEUTROABS 9.1*  --   --   --   --   HGB 13.4 13.5 13.3 12.5 12.0  HCT 40.1 41.0 40.3 37.0 35.8*  MCV 88.1 90.5 89.6 87.3 86.3  PLT 219 226 226 237 233   Cardiac Enzymes: No results for input(s): CKTOTAL, CKMB, CKMBINDEX, TROPONINI in the last 168 hours. CBG:  Recent Labs Lab 09/04/14 0532  GLUCAP 174*    Iron Studies: No results for input(s): IRON, TIBC, TRANSFERRIN, FERRITIN in the last 72 hours. Studies/Results: Mr [name] [name] [name] Contrast  09/07/2014   CLINICAL DATA:  Initial valuation for the increased gassiness, history of AFib, concern for stroke.  EXAM: MRI HEAD WITHOUT CONTRAST  TECHNIQUE: Multiplanar, multiecho pulse sequences of the brain and surrounding structures were obtained without intravenous contrast.  COMPARISON:  Prior study from 12/22/2004.  FINDINGS: No abnormal foci of restricted diffusion to suggest acute intracranial infarct. Gray-white matter differentiation maintained. Normal intravascular flow voids preserved. No acute or chronic intracranial hemorrhage.  Cerebral volume within normal limits for patient age. Minimal T2/FLAIR hyperintensity within the periventricular white matter, like related to chronic small vessel ischemic disease, felt to be within normal limits for patient age. No mass lesion, midline shift, or mass effect. No hydrocephalus. No extra-axial fluid collection.  Craniocervical junction within normal limits. Incidental note made of [name] [name] partially empty sella. Pituitary gland otherwise unremarkable.  No acute abnormality about the orbits.  Paranasal sinuses are clear. No mastoid effusion. Inner ear structures grossly normal.  Bone marrow signal intensity normal. Scalp soft tissues within normal limits.  IMPRESSION: Negative brain MRI with no acute intracranial infarct or other process identified.   Electronically Signed   By: Rise Mu M.D.   On: 09/07/2014 04:43   . diltiazem  90 mg Oral 3 times per day  . docusate sodium  100 mg Oral BID  .  metoprolol tartrate  25 mg Oral BID  . mometasone-formoterol  2 puff Inhalation BID  . pantoprazole  40 mg Oral Daily  . pneumococcal 23 valent vaccine  0.5 mL Intramuscular Tomorrow-1000  . pravastatin  40 mg Oral q1800  . senna  2 tablet Oral QHS  . sodium chloride  3 mL Intravenous Q12H  . sodium chloride  3 mL Intravenous Q12H    BMET    Component Value Date/Time   NA 125* 09/07/2014 0306   K 4.6 09/07/2014 0306   CL 96* 09/07/2014 0306   CO2 20* 09/07/2014 0306   GLUCOSE 107* 09/07/2014 0306   BUN 32* 09/07/2014 0306   CREATININE 2.38* 09/07/2014 0306   CALCIUM 8.2* 09/07/2014 0306   GFRNONAA 21* 09/07/2014 0306   GFRAA 25* 09/07/2014 0306   CBC    Component Value Date/Time   WBC 23.2* 09/07/2014 0306   RBC 4.15 09/07/2014 0306   HGB 12.0 09/07/2014 0306   HCT 35.8* 09/07/2014 0306   PLT 233 09/07/2014 0306   MCV 86.3 09/07/2014 0306   MCH 28.9 09/07/2014 0306   MCHC 33.5 09/07/2014 0306   RDW 15.4 09/07/2014 0306   LYMPHSABS 1.0 09/03/2014 1600   MONOABS 0.6 09/03/2014 1600   EOSABS 0.0 09/03/2014 1600   BASOSABS 0.0 09/03/2014 1600   Assessment/Plan:  1. AKI in setting of acute left renal infarct as well as hypotension and IV contrast.  Non-oliguric.  Multiple renal insults and Scr continues to climb however the rate of rise has slowed.  Would not recommend proceeding with cardiac cath in light of rising Scr 2. 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  Difficult situation as she will need cardiac cath and likely valve repair but has ARF.  Continue to follow. 3. HTN- stable 4. ABLA- transfuse prn 5. Possible UTI per primary 6. Hyponatremia- in setting of ABLA and ARF and edema.   1. Will start iv furosemide and follow sodium levels and I's/O's 7. Mitral and aortic valve stenosis due to rheumatic heart disease- cardiology following 8.   [name] [name] [name]

## 2014-09-07 NOTE — Progress Notes (Signed)
IV heparin off for over 1hour due to loss of access. Emelda Brothers RN

## 2014-09-07 NOTE — Progress Notes (Signed)
ANTICOAGULATION CONSULT NOTE - Follow Up Consult  Pharmacy Consult for Heparin Indication: Atrial clot and infarcted kidney  Allergies  Allergen Reactions  . Codeine Itching    Patient Measurements: Height:  (149.9 cm) Weight: 260 lb 9.6 oz (118.207 kg) IBW/kg (Calculated) : 43.2 Heparin Dosing Weight:   Vital Signs: Temp: 98.2 F (36.8 C) (08/26 1123) Temp Source: Oral (08/26 1123) BP: 122/72 mmHg (08/26 1315) Pulse Rate: 67 (08/26 0854)  Labs:  Recent Labs  09/05/14 0350  09/06/14 0712 09/06/14 1902 09/07/14 0306 09/07/14 0949  HGB 13.3  --  12.5  --  12.0  --   HCT 40.3  --  37.0  --  35.8*  --   PLT 226  --  237  --  233  --   HEPARINUNFRC 0.29*  < > 0.24* 0.16* 0.32 0.44  CREATININE 1.91*  --  2.28*  --  2.38*  --   < > = values in this interval not displayed.  Estimated Creatinine Clearance: 29.8 mL/min (by C-G formula based on Cr of 2.38).   Medications:  Scheduled:  . diltiazem  90 mg Oral 3 times per day  . docusate sodium  100 mg Oral BID  . furosemide  40 mg Intravenous Q12H  . metoprolol tartrate  25 mg Oral BID  . mometasone-formoterol  2 puff Inhalation BID  . pantoprazole  40 mg Oral Daily  . pneumococcal 23 valent vaccine  0.5 mL Intramuscular Tomorrow-1000  . pravastatin  40 mg Oral q1800  . senna  2 tablet Oral QHS  . sodium chloride  3 mL Intravenous Q12H  . sodium chloride  3 mL Intravenous Q12H    Assessment: 58yo female on heparin with therapeutic heparin level this AM on 1900 units/hr.  Hg and pltc are stable.  No bleeding noted.  Doppler (-)DVT.  Goal of Therapy:  Heparin level 0.3-0.7 units/ml Monitor platelets by anticoagulation protocol: Yes   Plan:  Continue Heparin at current rate Monitor for s/s of bleeding FU in AM  Marisue Humble, PharmD Clinical Pharmacist [facility]

## 2014-09-07 NOTE — Progress Notes (Signed)
VASCULAR LAB PRELIMINARY  PRELIMINARY  PRELIMINARY  PRELIMINARY  Bilateral lower extremity venous duplex completed.    Preliminary report:  Bilateral:  No evidence of DVT, superficial thrombosis, or Baker's Cyst.    Tyrion Glaude, RVT 09/07/2014, 11:41 AM

## 2014-09-08 ENCOUNTER — Inpatient Hospital Stay (HOSPITAL_COMMUNITY): Payer: Commercial Managed Care - HMO

## 2014-09-08 DIAGNOSIS — R600 Localized edema: Secondary | ICD-10-CM

## 2014-09-08 DIAGNOSIS — I10 Essential (primary) hypertension: Secondary | ICD-10-CM

## 2014-09-08 DIAGNOSIS — N289 Disorder of kidney and ureter, unspecified: Secondary | ICD-10-CM | POA: Insufficient documentation

## 2014-09-08 LAB — HEPARIN LEVEL (UNFRACTIONATED)
Heparin Unfractionated: 0.23 IU/mL — ABNORMAL LOW (ref 0.30–0.70)
Heparin Unfractionated: 0.23 IU/mL — ABNORMAL LOW (ref 0.30–0.70)

## 2014-09-08 LAB — RENAL FUNCTION PANEL
ANION GAP: 14 (ref 5–15)
Albumin: 2.5 g/dL — ABNORMAL LOW (ref 3.5–5.0)
BUN: 35 mg/dL — ABNORMAL HIGH (ref 6–20)
CHLORIDE: 99 mmol/L — AB (ref 101–111)
CO2: 16 mmol/L — AB (ref 22–32)
Calcium: 8.3 mg/dL — ABNORMAL LOW (ref 8.9–10.3)
Creatinine, Ser: 2.09 mg/dL — ABNORMAL HIGH (ref 0.44–1.00)
GFR calc non Af Amer: 25 mL/min — ABNORMAL LOW (ref >60)
GFR, EST AFRICAN AMERICAN: 29 mL/min — AB (ref >60)
GLUCOSE: 80 mg/dL (ref 65–99)
Phosphorus: 3.7 mg/dL (ref 2.5–4.6)
Potassium: 4.2 mmol/L (ref 3.5–5.1)
Sodium: 129 mmol/L — ABNORMAL LOW (ref 135–145)

## 2014-09-08 LAB — CBC
HEMATOCRIT: 37.6 % (ref 36.0–46.0)
HEMOGLOBIN: 12.8 g/dL (ref 12.0–15.0)
MCH: 29.2 pg (ref 26.0–34.0)
MCHC: 34 g/dL (ref 30.0–36.0)
MCV: 85.6 fL (ref 78.0–100.0)
PLATELETS: 255 10*3/uL (ref 150–400)
RBC: 4.39 MIL/uL (ref 3.87–5.11)
RDW: 15 % (ref 11.5–15.5)
WBC: 18.9 10*3/uL — AB (ref 4.0–10.5)

## 2014-09-08 NOTE — Progress Notes (Addendum)
SUBJECTIVE: Says breathing is better than yesterday. Denies chest pain.     Intake/Output Summary (Last 24 hours) at 09/08/14 1003 Last data filed at 09/08/14 0800  Gross per 24 hour  Intake 1123.44 ml  Output   2250 ml  Net -1126.56 ml    Current Facility-Administered Medications  Medication Dose Route Frequency Provider Last Rate Last Dose  . 0.9 %  sodium chloride infusion  250 mL Intravenous PRN Mihai Croitoru, MD      . 0.9 %  sodium chloride infusion   Intravenous Continuous Thurmon Fair, MD 10 mL/hr at 09/07/14 0516    . benzonatate (TESSALON) capsule 200 mg  200 mg Oral TID PRN Hillary Bow, DO   200 mg at 09/05/14 2104  . diltiazem (CARDIZEM) tablet 90 mg  90 mg Oral 3 times per day Thurmon Fair, MD   90 mg at 09/08/14 0526  . docusate sodium (COLACE) capsule 100 mg  100 mg Oral BID Renae Fickle, MD   100 mg at 09/08/14 0949  . furosemide (LASIX) injection 40 mg  40 mg Intravenous Q12H Mihai Croitoru, MD   40 mg at 09/08/14 0948  . heparin ADULT infusion 100 units/mL (25000 units/250 mL)  2,050 Units/hr Intravenous Continuous Titus Mould, RPH 20.5 mL/hr at 09/08/14 0629 2,050 Units/hr at 09/08/14 0629  . lactulose (CHRONULAC) 10 GM/15ML solution 30 g  30 g Oral Daily PRN Renae Fickle, MD      . metoprolol tartrate (LOPRESSOR) tablet 25 mg  25 mg Oral BID Thurmon Fair, MD   25 mg at 09/08/14 0949  . mometasone-formoterol (DULERA) 100-5 MCG/ACT inhaler 2 puff  2 puff Inhalation BID Hillary Bow, DO   2 puff at 09/08/14 [phone]  . morphine (MSIR) tablet 15-30 mg  15-30 mg Oral Q4H PRN Renae Fickle, MD   15 mg at 09/06/14 1007  . morphine 2 MG/ML injection 2-4 mg  2-4 mg Intravenous Q4H PRN Renae Fickle, MD      . ondansetron [facility]) injection 4 mg  4 mg Intravenous Q6H PRN Leda Gauze, NP   4 mg at 09/04/14 2058  . pantoprazole (PROTONIX) EC tablet 40 mg  40 mg Oral Daily Hillary Bow, DO   40 mg at 09/08/14 0949  . pneumococcal 23  valent vaccine (PNU-IMMUNE) injection 0.5 mL  0.5 mL Intramuscular Tomorrow-1000 Clydia Llano, MD   0.5 mL at 09/05/14 1106  . pravastatin (PRAVACHOL) tablet 40 mg  40 mg Oral q1800 Hillary Bow, DO   40 mg at 09/07/14 1710  . senna (SENOKOT) tablet 17.2 mg  2 tablet Oral QHS Renae Fickle, MD   17.2 mg at 09/07/14 2124  . sodium chloride 0.9 % injection 3 mL  3 mL Intravenous Q12H Hillary Bow, DO   3 mL at 09/07/14 2133  . sodium chloride 0.9 % injection 3 mL  3 mL Intravenous Q12H Mihai Croitoru, MD   3 mL at 09/07/14 0854  . sodium chloride 0.9 % injection 3 mL  3 mL Intravenous PRN Thurmon Fair, MD        Filed Vitals:   09/08/14 9604 09/08/14 0425 09/08/14 0530 09/08/14 0812  BP:  125/93    Pulse:  89    Temp: 98.3 F (36.8 C) 98 F (36.7 C)    TempSrc: Oral Oral    Resp:  22    Height:      Weight:   259 lb 4.8  oz (117.618 kg)   SpO2:  98%  100%    PHYSICAL EXAM General: NAD HEENT: Normal. Neck: +JVD  Lungs: Diminished throughout. CV: Irregular rhythm, HR 90's, 2/6 systolic murmur. Abdomen: Obese.  Neurologic: Alert and oriented.  Psych: Flat affect. Extrem: Bilateral lower extremity pitting edema.   TELEMETRY: Reviewed telemetry pt in atrial fib, HR 90's.  LABS: Basic Metabolic Panel:  Recent Labs  16/10/96 0306 09/08/14 0431  NA 125* 129*  K 4.6 4.2  CL 96* 99*  CO2 20* 16*  GLUCOSE 107* 80  BUN 32* 35*  CREATININE 2.38* 2.09*  CALCIUM 8.2* 8.3*  PHOS 4.4 3.7   Liver Function Tests:  Recent Labs  09/07/14 0306 09/08/14 0431  ALBUMIN 2.3* 2.5*   No results for input(s): LIPASE, AMYLASE in the last 72 hours. CBC:  Recent Labs  09/07/14 0306 09/08/14 0431  WBC 23.2* 18.9*  HGB 12.0 12.8  HCT 35.8* 37.6  MCV 86.3 85.6  PLT 233 255   Cardiac Enzymes: No results for input(s): CKTOTAL, CKMB, CKMBINDEX, TROPONINI in the last 72 hours. BNP: Invalid input(s): POCBNP D-Dimer: No results for input(s): DDIMER in the last 72  hours. Hemoglobin A1C: No results for input(s): HGBA1C in the last 72 hours. Fasting Lipid Panel: No results for input(s): CHOL, HDL, LDLCALC, TRIG, CHOLHDL, LDLDIRECT in the last 72 hours. Thyroid Function Tests: No results for input(s): TSH, T4TOTAL, T3FREE, THYROIDAB in the last 72 hours.  Invalid input(s): FREET3 Anemia Panel: No results for input(s): VITAMINB12, FOLATE, FERRITIN, TIBC, IRON, RETICCTPCT in the last 72 hours.  RADIOLOGY: Dg Chest 2 View  08/28/2014   CLINICAL DATA:  Shortness of breath and chest pain.  Mild fever.  EXAM: CHEST  2 VIEW  COMPARISON:  May 19, 2014  FINDINGS: There is no edema or consolidation. Heart is upper normal in size with pulmonary vascularity within normal limits. No adenopathy. No pneumothorax. No bone lesions.  IMPRESSION: No edema or consolidation.   Electronically Signed   By: Bretta Bang III M.D.   On: 08/28/2014 09:47   Mr Brain Wo Contrast  09/07/2014   CLINICAL DATA:  Initial valuation for the increased gassiness, history of AFib, concern for stroke.  EXAM: MRI HEAD WITHOUT CONTRAST  TECHNIQUE: Multiplanar, multiecho pulse sequences of the brain and surrounding structures were obtained without intravenous contrast.  COMPARISON:  Prior study from 12/22/2004.  FINDINGS: No abnormal foci of restricted diffusion to suggest acute intracranial infarct. Gray-white matter differentiation maintained. Normal intravascular flow voids preserved. No acute or chronic intracranial hemorrhage.  Cerebral volume within normal limits for patient age. Minimal T2/FLAIR hyperintensity within the periventricular white matter, like related to chronic small vessel ischemic disease, felt to be within normal limits for patient age. No mass lesion, midline shift, or mass effect. No hydrocephalus. No extra-axial fluid collection.  Craniocervical junction within normal limits. Incidental note made of a partially empty sella. Pituitary gland otherwise unremarkable.  No acute  abnormality about the orbits.  Paranasal sinuses are clear. No mastoid effusion. Inner ear structures grossly normal.  Bone marrow signal intensity normal. Scalp soft tissues within normal limits.  IMPRESSION: Negative brain MRI with no acute intracranial infarct or other process identified.   Electronically Signed   By: Rise Mu M.D.   On: 09/07/2014 04:43   Dg Chest Port 1 View  09/05/2014   CLINICAL DATA:  Follow-up of elevated white blood cell count  EXAM: PORTABLE CHEST - 1 VIEW  COMPARISON:  Chest x-ray of September 03, 2014  FINDINGS: The patient is positioned in a lordotic matter today which limits the apparent expansion of the lungs. The interstitial markings remain increased and there is subsegmental atelectasis at the right lung base. There is a small amount of fluid in the minor fissure. The cardiac silhouette remains enlarged. The pulmonary vascularity is more conspicuous today. The bony thorax exhibits no acute abnormality.  IMPRESSION: CHF with mild interstitial edema. Subsegmental atelectasis at the right lung base. When the patient can tolerate the procedure, a PA and lateral chest x-ray would be useful.   Electronically Signed   By: David  [place] M.D.   On: 09/05/2014 08:17   Dg Abd Acute W/chest  09/03/2014   CLINICAL DATA:  Congestion for 2 weeks. History of heart murmur. Vomiting.  EXAM: DG ABDOMEN ACUTE W/ 1V CHEST  COMPARISON:  08/30/2014 and 05/19/2014  FINDINGS: Interstitial markings are mildly prominent but similar to the previous examination. Heart size is slightly prominent but stable. The trachea is midline. There is no evidence to suggest free air. Gas in the stomach. Small amount of gas and stool in the colon. Nonobstructive bowel gas pattern. Mild degenerative changes at the pubic symphysis.  IMPRESSION: Slightly prominent lung markings which are similar to the previous examinations. Difficult to exclude mild interstitial edema or vascular congestion.  Gaseous  distention of the stomach.   Electronically Signed   By: Richarda Overlie M.D.   On: 09/03/2014 18:51   Ct Cta Abd/pel W/cm &/or W/o Cm  09/03/2014   CLINICAL DATA:  59 year old with left-sided abdominal pain for 2 weeks. New onset of atrial fibrillation.  EXAM: CT ANGIOGRAPHY ABDOMEN AND PELVIS  TECHNIQUE: Multidetector CT imaging of the abdomen and pelvis was performed using the standard protocol during bolus administration of intravenous contrast. Multiplanar reconstructed images including MIPs were obtained and reviewed to evaluate the vascular anatomy.  CONTRAST:  100 mL Omnipaque 350  COMPARISON:  Abdominal radiographs 09/03/2014  FINDINGS: ARTERIAL FINDINGS:  Aorta/Heart: Large filling defects in the posterior left atrium are most compatible with thrombus. The entire left atrium is not imaged. Right atrium appears to be enlarged. Cannot exclude thrombus in the right pulmonary veins. The abdominal aorta is patent without aneurysm or dissection.  Celiac axis: Celiac trunk and main branch vessels are patent, including the splenic artery.  Superior mesenteric: The superior mesenteric artery is patent. Main branch vessels are patent.  Left renal: Occlusion of the left renal artery just beyond the origin. Minimal blood flow to the left kidney even on the delayed images.  Right renal:         Right renal artery is patent.  Inferior mesenteric: Patent.  Left iliac: Left iliac arteries are patent. The proximal left femoral arteries are patent.  Right iliac: Right iliac arteries are patent. The proximal right femoral arteries are patent.  Venous findings: IVC and renal veins are patent. Portal venous system is patent.  Review of the MIP images confirms the above findings.  NONVASCULAR FINDINGS:  There is a small-moderate sized right pleural effusion. Few patchy densities in the right middle lobe may represent atelectasis. No gross abnormality to the liver. There is a small amount of perihepatic ascites and fluid around  the gallbladder. Suspect a small amount of periportal edema. No gross abnormality to the spleen or adrenal glands. No gross abnormality to the pancreas. Suspect scarring along the right kidney lower pole. The left kidney is edematous with minimal arterial flow. Findings compatible with acute vascular occlusion of  the left renal artery with left kidney ischemia.  Some of the images are limited by motion artifact. No gross abnormality to the stomach or duodenum. Small amount of free fluid in the pelvis. Uterus is absent. No gross abnormality to the urinary bladder. No gross abnormality to the small or large bowel.  No acute bone abnormality.  IMPRESSION: Large filling defect in the left atrium is suggestive for thrombus. An underlying lesion cannot be excluded. This may be better characterized with an echocardiogram.  Occlusion of the proximal left renal artery with no significant flow to the left kidney. 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 Findings compatible with thromboembolic disease originating from the left atrium.  Small amount of fluid in the right abdomen and pelvis.  Critical Value/emergent results were called by telephone at the time of interpretation on 09/03/2014 at 8:46 pm to Dr. Benjiman Core , who verbally acknowledged these results.   Electronically Signed   By: Richarda Overlie M.D.   On: 09/03/2014 21:05      ASSESSMENT AND PLAN: 1. 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 CT surgery has evaluated. Given large thrombus, they prefer 4-6 weeks of warfarin and possible amiodarone loading to maintain sinus rhythm. Will need right and left heart cath with coronary angiography next week once renal function improves.. Will need f/u TEE to document resolution of left atrial thrombus.  2. New onset atrial  fibrillation: Rates reasonably controlled. On IV heparin with upcoming cath this week after renal function improves. Will eventually need 4-6 weeks of warfarin for left atrial thrombus. Continue diltiazem 90 mg tid and metoprolol 25 mg bid for now.  3. Acute diastolic heart failure: On IV Lasix with 700+ cc output in last 24 hours in context of renal infarct and acute renal failure. NA improved to 129 (125 yesterday), function of CHF.  4. Essential HTN: Stable.  5. 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 On heparin. Nephrology following.   Prentice Docker, M.D., F.A.C.C.

## 2014-09-08 NOTE — Progress Notes (Signed)
CARDIAC REHAB PHASE I   Patient sleeping when we went into room. We woke her up and she states that she was too drowsy to walk. Patient seemed to be breathing heavily while sleeping. Made nurse aware.   Rendy Lazard, [place], [place] 09/08/2014 2:37 PM

## 2014-09-08 NOTE — Progress Notes (Addendum)
ANTICOAGULATION CONSULT NOTE - Follow Up Consult  Pharmacy Consult for Heparin Indication: Atrial clot and infarcted kidney  Allergies  Allergen Reactions  . Codeine Itching    Patient Measurements: Height:  (149.9 cm) Weight: 259 lb 4.8 oz (117.618 kg) IBW/kg (Calculated) : 43.2 Heparin Dosing Weight: 72 kg  Vital Signs: Temp: 98 F (36.7 C) (08/27 0425) Temp Source: Oral (08/27 0425) BP: 125/93 mmHg (08/27 0425) Pulse Rate: 89 (08/27 0425)  Labs:  Recent Labs  09/06/14 0712  09/07/14 0306 09/07/14 0949 09/08/14 0431  HGB 12.5  --  12.0  --  12.8  HCT 37.0  --  35.8*  --  37.6  PLT 237  --  233  --  255  HEPARINUNFRC 0.24*  < > 0.32 0.44 0.23*  CREATININE 2.28*  --  2.38*  --   --   < > = values in this interval not displayed.  Estimated Creatinine Clearance: 29.7 mL/min (by C-G formula based on Cr of 2.38).  Assessment: 59yo female on heparin for atrial clot and infarcted kidney. Heparin level subtherapeutic (0.23) on 1900 units/hr.  CBC stable. No bleeding noted per RN. RN noted that heparin off from ~1900 to 2000 last night - heparin being off at this time should not affect accuracy of a.m. heparin level.  Goal of Therapy:  Heparin level 0.3-0.7 units/ml Monitor platelets by anticoagulation protocol: Yes   Plan:  Increase heparin to 2050 units/hr F/u 8 hr heparin level  Christoper Fabian, PharmD, BCPS Clinical pharmacist, pager [phone] 09/08/2014 5:46 AM

## 2014-09-08 NOTE — Progress Notes (Signed)
[facility] PROGRESS NOTE  [name] [name] ZOX:[id number] DOB: Apr 19, 1955 DOA: 09/03/2014 PCP: Delorse Lek, MD  Brief Summary  The patient is a 59 year old female with history of asthma/pseudo-asthma who presented to the emergency department with productive cough for 1.5 weeks.  She also had severe left-sided abdominal, flank, and back pain. She was seen 1 week prior to admission by her pulmonologist to gave her medication for bronchitis and respiratory infection.  She was found to be in a-fib with RVR.  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  She was anticoagulated with heparin.  ECHO demonstrated severe mitral valve stenosis and moderate AV stenosis.  Her acute hypoxic respiratory failure, dyspnea, left atrial thrombus, atrial fibrillation, and moderate pulmonary hypertension are all probably secondary to her severe mitral stenosis.  Her course is also complicated by AKI secondary to a complete left renal infarction and her creatinine continues to rise.  She may also have had some contrast induced nephropathy from her CT scan a few days ago.  In the meantime, continuing to work on volume status, rate control and Nephrology is consulting regarding AKI.    Assessment/Plan  Severe mitral valve stenosis/mild to moderate aortic valve stenosis.  She also has severely reduced systolic function of the right ventricle and a moderately dilated RA, PA peak pressure of 64 mmHg suggesting moderate pulmonary hypertension. -  Cardiothoracic surgery recommends anticoagulation for at least a month prior to proceeding with surgery -  Beginning cardiac rehab/PT -  PFTs unable to be completed today due to fatigue -  Carotid arteries 1-39% with antegrade VA flow -  preop RHC/LHC postponed until kidney function improves  A. fib with RVR, new onset and likely a complication  from severe MR, chads2vasc score of 1 for female gender, 0.6% chance of stroke per year.  She does not need anticoagulation other than aspirin but has other indications for full anticoagulation as below.  Due to her valvular issues, she will require coumadin at discharge.   -  Rate control per cardiology:  continue diltiazem 90mg  po q6h -  Telemetry: demonstrated a-fib with rate in 80s -  Appreciate cardiology assistance -  Anticoagulation as below  Large left atrial thrombus due to severe LAD from severe mitral valve stenosis.  Hypercoagulable state less likely given valvular issue but panel sent for completeness sake.    -  IV heparin -  F/u hypercoagulable panel:  Cardiolipin neg, homocysteine neg, beta2-glycoprotein neg, lupus ac neg, protein S activity and total wnl, protein C activity and total low, antithrombin III low 63. -  Prothrombin and factor V leiden pending  Acute hypoxic respiratory failure secondary to acute diastolic heart failure.  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  Complete loss of left kidney.   -  Pain improving  -  Heparin as above  Acute kidney injury, creatinine baseline of 0.9 and currently 2.28, down to 2.09 today -  Probably secondary to renal infarct and recent IV contrast -  No evidence of obstruction on CT -  Heart cath deferred probably until Monday -  uop increasing  Hyponatremia, likely hypovolemic from heart failure and improving with diuresis -  Repeat BMP in AM  Asymmetric bilateral lower extremity edema -  Venous  duplex LE neg for DVT  Probable peripheral vascular disease -  Follow-up ABI  Hypertension, blood pressure low normal  Hyperkalemia, resolved with IV fluids and Kayexalate  Pyuria, urine culture negative.    Leukocytosis may be reactive to kidney infarction and resolving -  CXR without pneumonia and UCx  neg  Constipation, resolved -  Continue colace, senna -  Lactulose prn  Diet:  Healthy heart Access:  PIV IVF:  off Proph:  heparin  Code Status: full Family Communication: patient and her family Disposition Plan:  Pending improvement in kidney function, heart catheterization, off dilt gtt, stable on warfarin.  Will likely have outpatient valve replacement.  Will need rehab.    Consultants:  Cardiology, Dr. Royann Shivers  Vascular surgery by phone, Dr. Zachery Dakins  Nephrology, Dr. Arrie Aran  Cardiothoracic surgery consult pending  Procedures:  KUB  CT angio abd/pelvis  ECHO  CXR  Planned:  [facility] once kidney function improved  Carotid duplex:  1-39% stenosis bilaterally  Vascular duplex LE:  Neg for DVT  Antibiotics:  Ceftriaxone 8/22 > 8/26  HPI/Subjective:  Lleft upper quadrant pain improving.  Family concerned that she still looks like she is having a hard time breathing, particularly while asleep.    Objective: Filed Vitals:   09/07/14 2358 09/08/14 0035 09/08/14 0425 09/08/14 0530  BP:   125/93   Pulse:   89   Temp: 98 F (36.7 C) 98.3 F (36.8 C) 98 F (36.7 C)   TempSrc: Oral Oral Oral   Resp:   22   Height:      Weight:    117.618 kg (259 lb 4.8 oz)  SpO2:   98%     Intake/Output Summary (Last 24 hours) at 09/08/14 0714 Last data filed at 09/08/14 0500  Gross per 24 hour  Intake    998 ml  Output   1900 ml  Net   -902 ml   Filed Weights   09/07/14 0400 09/07/14 0735 09/08/14 0530  Weight: 118.343 kg (260 lb 14.4 oz) 118.207 kg (260 lb 9.6 oz) 117.618 kg (259 lb 4.8 oz)   Body mass index is 52.34 kg/(m^2).  Exam:   General:  Adult female, mild respiratory distress with SCM retractions, ill-appearing.  Obese but cachectic around face  HEENT:  NCAT, MMM, JVP to preauricular space while mostly upright   Cardiovascular:  IRRR, 2/6 murmur, rate in the 70s   Respiratory:  Diminished bilateral breath sounds with rales at bases  Abdomen:    NABS, soft, nondistended, obese, tender to palpation in the left upper quadrant without rebound or guarding  MSK:   Normal tone and bulk, 2+ LLE edema and 1+ RLE edema  Neuro:  Diffusely weak  Data Reviewed: Basic Metabolic Panel:  Recent Labs Lab 09/03/14 1600  09/04/14 0410 09/04/14 0750 09/05/14 0350 09/06/14 0712 09/07/14 0306 09/08/14 0431  NA 137  < > 133*  --  132* 128* 125* 129*  K 5.3*  < > 5.9* 5.8* 4.9 4.9 4.6 4.2  CL 104  < > 103  --  104 99* 96* 99*  CO2 22  < > 18*  --  17* 19* 20* 16*  GLUCOSE 158*  < > 177*  --  123* 108* 107* 80  BUN 21*  < > 20  --  24* 29* 32* 35*  CREATININE 1.22*  < > 1.49*  --  1.91* 2.28* 2.38* 2.09*  CALCIUM 8.9  < > 8.7*  --  8.5* 8.5* 8.2* 8.3*  MG 2.1  --   --   --   --   --   --   --   PHOS  --   --   --   --   --  4.0 4.4 3.7  < > = values in this interval not displayed. Liver Function Tests:  Recent Labs Lab 09/03/14 1600 09/06/14 0712 09/07/14 0306 09/08/14 0431  AST 60*  --   --   --   ALT 69*  --   --   --   ALKPHOS 143*  --   --   --   BILITOT 1.0  --   --   --   PROT 6.3*  --   --   --   ALBUMIN 3.1* 2.4* 2.3* 2.5*    Recent Labs Lab 09/03/14 1600  LIPASE 20*   No results for input(s): AMMONIA in the last 168 hours. CBC:  Recent Labs Lab 09/03/14 1600 09/04/14 0520 09/05/14 0350 09/06/14 0712 09/07/14 0306 09/08/14 0431  WBC 10.7* 14.4* 26.0* 25.2* 23.2* 18.9*  NEUTROABS 9.1*  --   --   --   --   --   HGB 13.4 13.5 13.3 12.5 12.0 12.8  HCT 40.1 41.0 40.3 37.0 35.8* 37.6  MCV 88.1 90.5 89.6 87.3 86.3 85.6  PLT 219 226 226 237 233 255    Recent Results (from the past 240 hour(s))  Urine culture     Status: None   Collection Time: 09/03/14  8:35 PM  Result Value Ref Range Status   Specimen Description URINE, CLEAN CATCH  Final   Special Requests NONE  Final   Culture   Final    MULTIPLE SPECIES PRESENT, SUGGEST RECOLLECTION Performed at [facility]    Report Status 09/05/2014  FINAL  Final  MRSA PCR Screening     Status: None   Collection Time: 09/03/14  9:27 PM  Result Value Ref Range Status   MRSA by PCR NEGATIVE NEGATIVE Final    Comment:        The GeneXpert MRSA Assay (FDA approved for NASAL specimens only), is one component of a comprehensive MRSA colonization surveillance program. It is not intended to diagnose MRSA infection nor to guide or monitor treatment for MRSA infections.   Urine culture     Status: None   Collection Time: 09/05/14  9:59 AM  Result Value Ref Range Status   Specimen Description URINE, RANDOM  Final   Special Requests ADDED [id number]  Final   Culture NO GROWTH 1 DAY  Final   Report Status 09/06/2014 FINAL  Final     Studies: Mr Brain Wo Contrast  09/07/2014   CLINICAL DATA:  Initial valuation for the increased gassiness, history of AFib, concern for stroke.  EXAM: MRI HEAD WITHOUT CONTRAST  TECHNIQUE: Multiplanar, multiecho pulse sequences of the brain and surrounding structures were obtained without intravenous contrast.  COMPARISON:  Prior study from 12/22/2004.  FINDINGS: No abnormal foci of restricted diffusion to suggest acute intracranial infarct. Gray-white matter differentiation maintained. Normal intravascular flow voids preserved. No acute or chronic intracranial hemorrhage.  Cerebral volume within normal limits for patient age. Minimal T2/FLAIR hyperintensity within the periventricular white matter, like related to chronic small vessel ischemic disease, felt to be within normal limits for patient age. No mass lesion, midline shift, or mass effect. No hydrocephalus. No extra-axial fluid collection.  Craniocervical junction within normal limits. Incidental note made of a partially empty sella. Pituitary gland otherwise unremarkable.  No acute abnormality about the orbits.  Paranasal sinuses are clear. No mastoid effusion. Inner ear structures grossly normal.  Bone marrow signal intensity normal. Scalp soft tissues within  normal limits.  IMPRESSION: Negative brain MRI with no acute intracranial infarct or other process identified.   Electronically Signed   By: Rise Mu M.D.   On: 09/07/2014 04:43    Scheduled Meds: . diltiazem  90 mg Oral 3 times per day  . docusate sodium  100 mg Oral BID  . furosemide  40 mg Intravenous Q12H  . metoprolol tartrate  25 mg Oral BID  . mometasone-formoterol  2 puff Inhalation BID  . pantoprazole  40 mg Oral Daily  . pneumococcal 23 valent vaccine  0.5 mL Intramuscular Tomorrow-1000  . pravastatin  40 mg Oral q1800  . senna  2 tablet Oral QHS  . sodium chloride  3 mL Intravenous Q12H  . sodium chloride  3 mL Intravenous Q12H   Continuous Infusions: . sodium chloride 10 mL/hr at 09/07/14 0516  . heparin 2,050 Units/hr (09/08/14 0629)    Principal Problem:   Atrial fibrillation with rapid ventricular response Active Problems:   Renal infarction   Hyperkalemia   Left atrial thrombus   Acute renal artery occlusion   Mitral valve stenosis and aortic valve stenosis   Rheumatic mitral stenosis   Rheumatic aortic stenosis   Chronic diastolic heart failure   Acute cystitis without hematuria   Lower extremity edema    Time spent: 30 min    Eriona Kinchen, [facility]  [facility] Pager [phone]. If 7PM-7AM, please contact night-coverage at [email or web address], password [facility] 09/08/2014, 7:14 AM  LOS: 5 days

## 2014-09-08 NOTE — Progress Notes (Signed)
Patient ID: [name] [name], female   DOB: [birth year], 59 y.o.   MRN: [id number] S:feels a little better today O:BP 125/93 mmHg  Pulse 89  Temp(Src) 98 F (36.7 C) (Oral)  Resp 22  Ht  (1.499 m)  Wt 117.618 kg (259 lb 4.8 oz)  BMI 52.34 kg/m2  SpO2 100%  Intake/Output Summary (Last 24 hours) at 09/08/14 0926 Last data filed at 09/08/14 0800  Gross per 24 hour  Intake 1123.44 ml  Output   2250 ml  Net -1126.56 ml   Intake/Output: I/O last 3 completed shifts: In: 1856.8 [P.O.:1355; I.V.:451.8; IV Piggyback:50] Out: 1900 [Urine:1900]  Intake/Output this shift:  Total I/O In: -  Out: 350 [Urine:350] Weight change: -0.136 kg (-4.8 oz) Gen:WD obese AAF in NAD CVS:IRR IRR, +SEM Resp:decreased BS at bases QIO:NGEXB  Ext:2+ edema/anasarca   Recent Labs Lab 09/03/14 1600 09/03/14 1740 09/04/14 0009 09/04/14 0410 09/04/14 0750 09/05/14 0350 09/06/14 0712 09/07/14 0306 09/08/14 0431  NA 137 137  --  133*  --  132* 128* 125* 129*  K 5.3* 5.6* 5.4* 5.9* 5.8* 4.9 4.9 4.6 4.2  CL 104 105  --  103  --  104 99* 96* 99*  CO2 22 22  --  18*  --  17* 19* 20* 16*  GLUCOSE 158* 173*  --  177*  --  123* 108* 107* 80  BUN 21* 22*  --  20  --  24* 29* 32* 35*  CREATININE 1.22* 1.25*  --  1.49*  --  1.91* 2.28* 2.38* 2.09*  ALBUMIN 3.1*  --   --   --   --   --  2.4* 2.3* 2.5*  CALCIUM 8.9 8.9  --  8.7*  --  8.5* 8.5* 8.2* 8.3*  PHOS  --   --   --   --   --   --  4.0 4.4 3.7  AST 60*  --   --   --   --   --   --   --   --   ALT 69*  --   --   --   --   --   --   --   --    Liver Function Tests:  Recent Labs Lab 09/03/14 1600 09/06/14 0712 09/07/14 0306 09/08/14 0431  AST 60*  --   --   --   ALT 69*  --   --   --   ALKPHOS 143*  --   --   --   BILITOT 1.0  --   --   --   PROT 6.3*  --   --   --   ALBUMIN 3.1* 2.4* 2.3* 2.5*    Recent Labs Lab 09/03/14 1600  LIPASE 20*   No results for input(s): AMMONIA in the last 168 hours. CBC:  Recent Labs Lab  09/03/14 1600 09/04/14 0520 09/05/14 0350 09/06/14 0712 09/07/14 0306 09/08/14 0431  WBC 10.7* 14.4* 26.0* 25.2* 23.2* 18.9*  NEUTROABS 9.1*  --   --   --   --   --   HGB 13.4 13.5 13.3 12.5 12.0 12.8  HCT 40.1 41.0 40.3 37.0 35.8* 37.6  MCV 88.1 90.5 89.6 87.3 86.3 85.6  PLT 219 226 226 237 233 255   Cardiac Enzymes: No results for input(s): CKTOTAL, CKMB, CKMBINDEX, TROPONINI in the last 168 hours. CBG:  Recent Labs Lab 09/04/14 0532  GLUCAP 174*    Iron Studies: No results for input(s): IRON,  TIBC, TRANSFERRIN, FERRITIN in the last 72 hours. Studies/Results: Mr Sherrin Daisy Contrast  09/07/2014   CLINICAL DATA:  Initial valuation for the increased gassiness, history of AFib, concern for stroke.  EXAM: MRI HEAD WITHOUT CONTRAST  TECHNIQUE: Multiplanar, multiecho pulse sequences of the brain and surrounding structures were obtained without intravenous contrast.  COMPARISON:  Prior study from 12/22/2004.  FINDINGS: No abnormal foci of restricted diffusion to suggest acute intracranial infarct. Gray-white matter differentiation maintained. Normal intravascular flow voids preserved. No acute or chronic intracranial hemorrhage.  Cerebral volume within normal limits for patient age. Minimal T2/FLAIR hyperintensity within the periventricular white matter, like related to chronic small vessel ischemic disease, felt to be within normal limits for patient age. No mass lesion, midline shift, or mass effect. No hydrocephalus. No extra-axial fluid collection.  Craniocervical junction within normal limits. Incidental note made of a partially empty sella. Pituitary gland otherwise unremarkable.  No acute abnormality about the orbits.  Paranasal sinuses are clear. No mastoid effusion. Inner ear structures grossly normal.  Bone marrow signal intensity normal. Scalp soft tissues within normal limits.  IMPRESSION: Negative brain MRI with no acute intracranial infarct or other process identified.    Electronically Signed   By: Rise Mu M.D.   On: 09/07/2014 04:43   . diltiazem  90 mg Oral 3 times per day  . docusate sodium  100 mg Oral BID  . furosemide  40 mg Intravenous Q12H  . metoprolol tartrate  25 mg Oral BID  . mometasone-formoterol  2 puff Inhalation BID  . pantoprazole  40 mg Oral Daily  . pneumococcal 23 valent vaccine  0.5 mL Intramuscular Tomorrow-1000  . pravastatin  40 mg Oral q1800  . senna  2 tablet Oral QHS  . sodium chloride  3 mL Intravenous Q12H  . sodium chloride  3 mL Intravenous Q12H    BMET    Component Value Date/Time   NA 129* 09/08/2014 0431   K 4.2 09/08/2014 0431   CL 99* 09/08/2014 0431   CO2 16* 09/08/2014 0431   GLUCOSE 80 09/08/2014 0431   BUN 35* 09/08/2014 0431   CREATININE 2.09* 09/08/2014 0431   CALCIUM 8.3* 09/08/2014 0431   GFRNONAA 25* 09/08/2014 0431   GFRAA 29* 09/08/2014 0431   CBC    Component Value Date/Time   WBC 18.9* 09/08/2014 0431   RBC 4.39 09/08/2014 0431   HGB 12.8 09/08/2014 0431   HCT 37.6 09/08/2014 0431   PLT 255 09/08/2014 0431   MCV 85.6 09/08/2014 0431   MCH 29.2 09/08/2014 0431   MCHC 34.0 09/08/2014 0431   RDW 15.0 09/08/2014 0431   LYMPHSABS 1.0 09/03/2014 1600   MONOABS 0.6 09/03/2014 1600   EOSABS 0.0 09/03/2014 1600   BASOSABS 0.0 09/03/2014 1600     Assessment/Plan:  1. AKI in setting of acute left renal infarct as well as hypotension and IV contrast. Non-oliguric. Multiple renal insults and Scr continues to climb however the rate of rise has slowed. Continue to hold off of cardiac cath for now but hopefully her Scr will continue to improve and she can have cath in the middle of next week, although she will likely have some residual CKD stage 3.   1. Finally starting to improve, cont to follow 2. 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 Difficult situation as she will need cardiac cath and  likely valve repair but has ARF and left renal infarct so  residual CKD expected. Continue to follow. 3. HTN- stable 4. ABLA- transfuse prn 5. Possible UTI per primary 6. Hyponatremia- in setting of ABLA and ARF and edema.  1. Continue iv furosemide and follow sodium and Scr levels and I's/O's 2. Improving with diuresis 7. 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

## 2014-09-08 NOTE — Progress Notes (Signed)
Pt appears to have labored breathing. Oxygen saturation 99%. 2 liters of  O2 applied. No adventious lung sounds noted per ascultation.  MD made aware. Orders received. Will continue to monitor. George Hugh RN

## 2014-09-08 NOTE — Progress Notes (Signed)
ANTICOAGULATION CONSULT NOTE - Follow Up Consult  Pharmacy Consult for Heparin Indication: Atrial clot and infarcted kidney  Allergies  Allergen Reactions  . Codeine Itching    Patient Measurements: Height:  (149.9 cm) Weight: 259 lb 4.8 oz (117.618 kg) IBW/kg (Calculated) : 43.2 Heparin Dosing Weight: 72 kg  Vital Signs: Temp: 98 F (36.7 C) (08/27 0425) Temp Source: Oral (08/27 0425) BP: 125/93 mmHg (08/27 0425) Pulse Rate: 89 (08/27 0425)  Labs:  Recent Labs  09/06/14 0712  09/07/14 0306 09/07/14 0949 09/08/14 0431 09/08/14 1520  HGB 12.5  --  12.0  --  12.8  --   HCT 37.0  --  35.8*  --  37.6  --   PLT 237  --  233  --  255  --   HEPARINUNFRC 0.24*  < > 0.32 0.44 0.23* 0.23*  CREATININE 2.28*  --  2.38*  --  2.09*  --   < > = values in this interval not displayed.  Estimated Creatinine Clearance: 33.8 mL/min (by C-G formula based on Cr of 2.09).  Assessment: 59yo female on heparin for atrial clot and infarcted kidney. Heparin level remains subtherapeutic (0.23) on 2050 units/hr.  CBC stable. No bleeding noted per RN. Will avoid bolus since patient had some oozing around the IV site a few days ago along with blood in the urine. Now resolved. H/H and Plt remain stable.   Goal of Therapy:  Heparin level 0.3-0.7 units/ml Monitor platelets by anticoagulation protocol: Yes   Plan:  Increase heparin to 2250 units/hr. If HL remains unchanged, consider bolus dosing with careful monitoring for any bleeding  F/u 8 hr heparin level  Vinnie Level, PharmD., BCPS Clinical Pharmacist Pager [phone]

## 2014-09-09 ENCOUNTER — Inpatient Hospital Stay (HOSPITAL_COMMUNITY): Payer: Commercial Managed Care - HMO

## 2014-09-09 DIAGNOSIS — N3000 Acute cystitis without hematuria: Secondary | ICD-10-CM

## 2014-09-09 DIAGNOSIS — I050 Rheumatic mitral stenosis: Secondary | ICD-10-CM | POA: Insufficient documentation

## 2014-09-09 DIAGNOSIS — I5033 Acute on chronic diastolic (congestive) heart failure: Secondary | ICD-10-CM

## 2014-09-09 LAB — CBC
HEMATOCRIT: 37.7 % (ref 36.0–46.0)
HEMOGLOBIN: 12.6 g/dL (ref 12.0–15.0)
MCH: 28.9 pg (ref 26.0–34.0)
MCHC: 33.4 g/dL (ref 30.0–36.0)
MCV: 86.5 fL (ref 78.0–100.0)
PLATELETS: 255 10*3/uL (ref 150–400)
RBC: 4.36 MIL/uL (ref 3.87–5.11)
RDW: 15 % (ref 11.5–15.5)
WBC: 15.2 10*3/uL — AB (ref 4.0–10.5)

## 2014-09-09 LAB — RENAL FUNCTION PANEL
ANION GAP: 10 (ref 5–15)
Albumin: 2.2 g/dL — ABNORMAL LOW (ref 3.5–5.0)
BUN: 34 mg/dL — ABNORMAL HIGH (ref 6–20)
CHLORIDE: 96 mmol/L — AB (ref 101–111)
CO2: 22 mmol/L (ref 22–32)
CREATININE: 2.03 mg/dL — AB (ref 0.44–1.00)
Calcium: 8 mg/dL — ABNORMAL LOW (ref 8.9–10.3)
GFR, EST AFRICAN AMERICAN: 30 mL/min — AB (ref >60)
GFR, EST NON AFRICAN AMERICAN: 26 mL/min — AB (ref >60)
Glucose, Bld: 122 mg/dL — ABNORMAL HIGH (ref 65–99)
POTASSIUM: 3.9 mmol/L (ref 3.5–5.1)
Phosphorus: 3.3 mg/dL (ref 2.5–4.6)
Sodium: 128 mmol/L — ABNORMAL LOW (ref 135–145)

## 2014-09-09 LAB — HEPARIN LEVEL (UNFRACTIONATED)
HEPARIN UNFRACTIONATED: 0.42 [IU]/mL (ref 0.30–0.70)
Heparin Unfractionated: 0.41 IU/mL (ref 0.30–0.70)

## 2014-09-09 MED ORDER — MAGNESIUM CHLORIDE 64 MG PO TBEC
1.0000 | DELAYED_RELEASE_TABLET | Freq: Two times a day (BID) | ORAL | Status: DC
  Administered 2014-09-09 – 2014-09-18 (×18): 64 mg via ORAL
  Filled 2014-09-09 (×22): qty 1

## 2014-09-09 MED ORDER — POTASSIUM CHLORIDE CRYS ER 20 MEQ PO TBCR
20.0000 meq | EXTENDED_RELEASE_TABLET | Freq: Every day | ORAL | Status: DC
  Administered 2014-09-09 – 2014-09-13 (×5): 20 meq via ORAL
  Filled 2014-09-09 (×5): qty 1

## 2014-09-09 NOTE — Progress Notes (Signed)
Pre-op Cardiac Surgery   Upper Extremity Right Left  Brachial Pressures 125 113  Radial Waveforms Tri Tri  Ulnar Waveforms Tri Tri  Palmar Arch (Allen's Test) Obliterates with radial compression, normal with ulnar compression Obliterates with radial compression, normal with ulnar compression       Lower  Extremity Right Left  Dorsalis Pedis    Anterior Tibial 73, Mono 118, Tri  Posterior Tibial 79, Bi 126, Tri  Ankle/Brachial Indices 0.63, moderate arterial disease 1.01, normal range    Farrel Demark, RDMS, RVT 09/09/2014

## 2014-09-09 NOTE — Progress Notes (Signed)
ANTICOAGULATION CONSULT NOTE - Follow Up Consult  Pharmacy Consult for heparin Indication: atrial clot and infarcted kidney   Labs:  Recent Labs  09/07/14 0306  09/08/14 0431 09/08/14 1520 09/09/14 0117  HGB 12.0  --  12.8  --  12.6  HCT 35.8*  --  37.6  --  37.7  PLT 233  --  255  --  255  HEPARINUNFRC 0.32  < > 0.23* 0.23* 0.41  CREATININE 2.38*  --  2.09*  --  2.03*  < > = values in this interval not displayed.   Assessment/Plan:  59yo female therapeutic on heparin after rate increases. Will continue gtt at current rate and confirm stable with additional level.   Vernard Gambles, PharmD, BCPS  09/09/2014,1:58 AM

## 2014-09-09 NOTE — Progress Notes (Signed)
[facility] PROGRESS NOTE  [name] [name] ZOX:[id number] DOB: November 22, 1955 DOA: 09/03/2014 PCP: Delorse Lek, MD  Brief Summary  The patient is a 59 year old female with history of asthma/pseudo-asthma who presented to the emergency department with productive cough for 1.5 weeks.  She also had severe left-sided abdominal, flank, and back pain. She was seen 1 week prior to admission by her pulmonologist to gave her medication for bronchitis and respiratory infection.  She was found to be in a-fib with RVR.  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  She was anticoagulated with heparin.  ECHO demonstrated severe mitral valve stenosis and moderate AV stenosis.  Her acute hypoxic respiratory failure, dyspnea, left atrial thrombus, atrial fibrillation, and moderate pulmonary hypertension are all probably secondary to her severe mitral stenosis.  Her course is also complicated by AKI secondary to a complete left renal infarction and her creatinine continues to rise.  She may also have had some contrast induced nephropathy from her CT scan a few days ago.  In the meantime, continuing to work on volume status, rate control and Nephrology is consulting regarding AKI.    Assessment/Plan  Severe mitral valve stenosis/mild to moderate aortic valve stenosis.  She also has severely reduced systolic function of the right ventricle and a moderately dilated RA, PA peak pressure of 64 mmHg suggesting moderate pulmonary hypertension. -  Cardiothoracic surgery recommends anticoagulation for at least a month prior to proceeding with surgery -  Beginning cardiac rehab/PT -  Will retry PFTs after further diuresis -  Carotid arteries 1-39% with antegrade VA flow -  preop RHC/LHC postponed until kidney function improves  A. fib with RVR, new onset and likely a complication from severe  MR, chads2vasc score of 1 for female gender, 0.6% chance of stroke per year.  She does not need anticoagulation other than aspirin but has other indications for full anticoagulation as below.  Due to her valvular issues, she will require coumadin at discharge.   -  Rate control per cardiology:  continue diltiazem 90mg  po q6h -  Telemetry: demonstrated a-fib with rate in 80s -  Appreciate cardiology assistance -  Anticoagulation as below  Large left atrial thrombus due to severe LAD from severe mitral valve stenosis.  Hypercoagulable state less likely given valvular issue but panel sent for completeness sake.    -  IV heparin -  F/u hypercoagulable panel:  Cardiolipin neg, homocysteine neg, beta2-glycoprotein neg, lupus ac neg, protein S activity and total wnl, protein C activity and total low, antithrombin III low 63. -  Prothrombin and factor V leiden pending  Acute hypoxic respiratory failure secondary to acute diastolic heart failure.  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  Complete loss of left kidney.   -  Pain improving  -  Heparin as above  Acute kidney injury, creatinine baseline of 0.9 and currently 2.28, down to 2.09 today -  Probably secondary to renal infarct and recent IV contrast -  No evidence of obstruction on CT -  Heart cath deferred probably until Monday -  uop increasing  Hyponatremia, likely hypovolemic from heart failure and improving with diuresis -  Repeat BMP in AM  Asymmetric bilateral lower extremity edema -  Venous duplex LE neg  for DVT  Probable peripheral vascular disease -  Follow-up ABI  Hypertension, blood pressure low normal  Hyperkalemia, resolved with IV fluids and Kayexalate  Pyuria, urine culture negative.    Leukocytosis may be reactive to kidney infarction and resolving -  CXR without pneumonia and UCx neg  Constipation,  resolved -  Continue colace, senna -  Lactulose prn  Diet:  Healthy heart Access:  PIV IVF:  off Proph:  heparin  Code Status: full Family Communication: patient and her family Disposition Plan:  Pending improvement in kidney function, heart catheterization, off dilt gtt, stable on warfarin.  Will likely have outpatient valve replacement.  Will need rehab either CIR (not likely to cover) or SNF.    Consultants:  Cardiology, Dr. Royann Shivers  Vascular surgery by phone, Dr. Zachery Dakins  Nephrology, Dr. Arrie Aran  Cardiothoracic surgery, Dr. Barry Dienes  Procedures:  KUB  CT angio abd/pelvis  ECHO  CXR  Planned:  [facility] once kidney function improved  Carotid duplex:  1-39% stenosis bilaterally  Vascular duplex LE:  Neg for DVT  Antibiotics:  Ceftriaxone 8/22 > 8/26  HPI/Subjective:  Starting to breath more easily.  Family feels that she looks a lot better.  Voiding frequently.   Objective: Filed Vitals:   09/09/14 0001 09/09/14 0113 09/09/14 0548 09/09/14 0951  BP: 109/70 113/67 113/71   Pulse: 67 70 63   Temp: 97.6 F (36.4 C)  97.7 F (36.5 C)   TempSrc: Oral  Oral   Resp: 18  18   Height:      Weight:   116.03 kg (255 lb 12.8 oz)   SpO2: 100%  100% 100%    Intake/Output Summary (Last 24 hours) at 09/09/14 1225 Last data filed at 09/09/14 0900  Gross per 24 hour  Intake    120 ml  Output   1400 ml  Net  -1280 ml   Filed Weights   09/07/14 0735 09/08/14 0530 09/09/14 0548  Weight: 118.207 kg (260 lb 9.6 oz) 117.618 kg (259 lb 4.8 oz) 116.03 kg (255 lb 12.8 oz)   Body mass index is 51.64 kg/(m^2).  Exam:   General:  Adult female, NAD.  Obese but cachectic around face  HEENT:  NCAT, MMM, JVP to preauricular space while mostly upright   Cardiovascular:  IRRR, 2/6 murmur, rate in the 70s   Respiratory:  Diminished bilateral breath sounds at bases.  No rales, rhonchi or wheeze.   Abdomen:   NABS, soft, nondistended, obese, nontender  MSK:   Normal  tone and bulk, 2+ LLE edema and 1+ RLE edema  Neuro:  Diffusely weak  Data Reviewed: Basic Metabolic Panel:  Recent Labs Lab 09/03/14 1600  09/05/14 0350 09/06/14 0712 09/07/14 0306 09/08/14 0431 09/09/14 0117  NA 137  < > 132* 128* 125* 129* 128*  K 5.3*  < > 4.9 4.9 4.6 4.2 3.9  CL 104  < > 104 99* 96* 99* 96*  CO2 22  < > 17* 19* 20* 16* 22  GLUCOSE 158*  < > 123* 108* 107* 80 122*  BUN 21*  < > 24* 29* 32* 35* 34*  CREATININE 1.22*  < > 1.91* 2.28* 2.38* 2.09* 2.03*  CALCIUM 8.9  < > 8.5* 8.5* 8.2* 8.3* 8.0*  MG 2.1  --   --   --   --   --   --   PHOS  --   --   --  4.0 4.4 3.7 3.3  < > = values in  this interval not displayed. Liver Function Tests:  Recent Labs Lab 09/03/14 1600 09/06/14 0712 09/07/14 0306 09/08/14 0431 09/09/14 0117  AST 60*  --   --   --   --   ALT 69*  --   --   --   --   ALKPHOS 143*  --   --   --   --   BILITOT 1.0  --   --   --   --   PROT 6.3*  --   --   --   --   ALBUMIN 3.1* 2.4* 2.3* 2.5* 2.2*    Recent Labs Lab 09/03/14 1600  LIPASE 20*   No results for input(s): AMMONIA in the last 168 hours. CBC:  Recent Labs Lab 09/03/14 1600  09/05/14 0350 09/06/14 0712 09/07/14 0306 09/08/14 0431 09/09/14 0117  WBC 10.7*  < > 26.0* 25.2* 23.2* 18.9* 15.2*  NEUTROABS 9.1*  --   --   --   --   --   --   HGB 13.4  < > 13.3 12.5 12.0 12.8 12.6  HCT 40.1  < > 40.3 37.0 35.8* 37.6 37.7  MCV 88.1  < > 89.6 87.3 86.3 85.6 86.5  PLT 219  < > 226 237 233 255 255  < > = values in this interval not displayed.  Recent Results (from the past 240 hour(s))  Urine culture     Status: None   Collection Time: 09/03/14  8:35 PM  Result Value Ref Range Status   Specimen Description URINE, CLEAN CATCH  Final   Special Requests NONE  Final   Culture   Final    MULTIPLE SPECIES PRESENT, SUGGEST RECOLLECTION Performed at [facility]    Report Status 09/05/2014 FINAL  Final  MRSA PCR Screening     Status: None   Collection Time:  09/03/14  9:27 PM  Result Value Ref Range Status   MRSA by PCR NEGATIVE NEGATIVE Final    Comment:        The GeneXpert MRSA Assay (FDA approved for NASAL specimens only), is one component of a comprehensive MRSA colonization surveillance program. It is not intended to diagnose MRSA infection nor to guide or monitor treatment for MRSA infections.   Urine culture     Status: None   Collection Time: 09/05/14  9:59 AM  Result Value Ref Range Status   Specimen Description URINE, RANDOM  Final   Special Requests ADDED [id number]  Final   Culture NO GROWTH 1 DAY  Final   Report Status 09/06/2014 FINAL  Final     Studies: No results found.  Scheduled Meds: . diltiazem  90 mg Oral 3 times per day  . docusate sodium  100 mg Oral BID  . furosemide  40 mg Intravenous Q12H  . magnesium chloride  1 tablet Oral BID  . metoprolol tartrate  25 mg Oral BID  . mometasone-formoterol  2 puff Inhalation BID  . pantoprazole  40 mg Oral Daily  . pneumococcal 23 valent vaccine  0.5 mL Intramuscular Tomorrow-1000  . potassium chloride  20 mEq Oral Daily  . pravastatin  40 mg Oral q1800  . senna  2 tablet Oral QHS  . sodium chloride  3 mL Intravenous Q12H  . sodium chloride  3 mL Intravenous Q12H   Continuous Infusions: . sodium chloride 10 mL/hr at 09/07/14 0516  . heparin 2,250 Units/hr (09/08/14 1700)    Principal Problem:   Atrial fibrillation with rapid  ventricular response Active Problems:   Renal infarction   Hyperkalemia   Left atrial thrombus   Acute renal artery occlusion   Mitral valve stenosis and aortic valve stenosis   Rheumatic mitral stenosis   Rheumatic aortic stenosis   Chronic diastolic heart failure   Acute cystitis without hematuria   Lower extremity edema   Renal insufficiency    Time spent: 30 min    Yates Weisgerber, [facility]  [facility] Pager [phone]. If 7PM-7AM, please contact night-coverage at [email or web address], password [facility] 09/09/2014, 12:25 PM   LOS: 6 days

## 2014-09-09 NOTE — Progress Notes (Signed)
Patient Name: [name] [name] Date of Encounter: 09/09/2014  Primary cardiologist: Dr Patty Sermons   Principal Problem:   Atrial fibrillation with rapid ventricular response Active Problems:   Renal infarction   Hyperkalemia   Left atrial thrombus   Acute renal artery occlusion   Mitral valve stenosis and aortic valve stenosis   Rheumatic mitral stenosis   Rheumatic aortic stenosis   Chronic diastolic heart failure   Acute cystitis without hematuria   Lower extremity edema   Renal insufficiency    SUBJECTIVE  Feeling ok this morning, denies CP, mild SOB. Sitting up at bedside chair   CURRENT MEDS . diltiazem  90 mg Oral 3 times per day  . docusate sodium  100 mg Oral BID  . furosemide  40 mg Intravenous Q12H  . metoprolol tartrate  25 mg Oral BID  . mometasone-formoterol  2 puff Inhalation BID  . pantoprazole  40 mg Oral Daily  . pneumococcal 23 valent vaccine  0.5 mL Intramuscular Tomorrow-1000  . pravastatin  40 mg Oral q1800  . senna  2 tablet Oral QHS  . sodium chloride  3 mL Intravenous Q12H  . sodium chloride  3 mL Intravenous Q12H    OBJECTIVE  Filed Vitals:   09/08/14 2059 09/09/14 0001 09/09/14 0113 09/09/14 0548  BP:  109/70 113/67 113/71  Pulse:  67 70 63  Temp:  97.6 F (36.4 C)  97.7 F (36.5 C)  TempSrc:  Oral  Oral  Resp:  18  18  Height:      Weight:    255 lb 12.8 oz (116.03 kg)  SpO2: 99% 100%  100%    Intake/Output Summary (Last 24 hours) at 09/09/14 0931 Last data filed at 09/09/14 0900  Gross per 24 hour  Intake      0 ml  Output   1400 ml  Net  -1400 ml   Filed Weights   09/07/14 0735 09/08/14 0530 09/09/14 0548  Weight: 260 lb 9.6 oz (118.207 kg) 259 lb 4.8 oz (117.618 kg) 255 lb 12.8 oz (116.03 kg)    PHYSICAL EXAM  General: Pleasant, NAD. Neuro: Alert and oriented X 3. Moves all extremities spontaneously. Psych: Normal affect. HEENT:  Normal  Neck: Supple without bruits. Difficult to assess JVD Lungs:  Resp regular  and unlabored, CTA. Heart: irregular. no s3, s4. 2/6 systolic murmur Abdomen: Soft, non-tender, non-distended, BS + x 4.  Extremities: No clubbing, cyanosis. DP/PT/Radials 2+ and equal bilaterally. 2+ pitting edema in bilateral LE  Accessory Clinical Findings  CBC  Recent Labs  09/08/14 0431 09/09/14 0117  WBC 18.9* 15.2*  HGB 12.8 12.6  HCT 37.6 37.7  MCV 85.6 86.5  PLT 255 255   Basic Metabolic Panel  Recent Labs  09/08/14 0431 09/09/14 0117  NA 129* 128*  K 4.2 3.9  CL 99* 96*  CO2 16* 22  GLUCOSE 80 122*  BUN 35* 34*  CREATININE 2.09* 2.03*  CALCIUM 8.3* 8.0*  PHOS 3.7 3.3   Liver Function Tests  Recent Labs  09/08/14 0431 09/09/14 0117  ALBUMIN 2.5* 2.2*    TELE A-fib rate controlled    ECG  No new EKG  Echocardiogram 09/04/2014  LV EF: 50% -  55%  ------------------------------------------------------------------- Indications:   Atrial fibrillation - 427.31.  ------------------------------------------------------------------- History:  PMH: Left Atrial Thrombus. Mitral Stenosis. Dyspnea and murmur. Atrial fibrillation.  ------------------------------------------------------------------- Study Conclusions  - Left ventricle: The cavity size was normal. Wall thickness was increased in a pattern of  moderate LVH. Systolic function was normal. The estimated ejection fraction was in the range of 50% to 55%. Wall motion was normal; there were no regional wall motion abnormalities. - Ventricular septum: The contour showed diastolic flattening. - Aortic valve: There was mild to moderate stenosis. There was trivial regurgitation. Valve area (VTI): 1.38 cm^2. Valve area (Vmax): 1.52 cm^2. Valve area (Vmean): 1.41 cm^2. - Mitral valve: The findings are consistent with severe stenosis. Valve area by continuity equation (using LVOT flow): 1.15 cm^2. - Left atrium: The atrium was mildly dilated. There was a largethrombus. -  Right ventricle: Systolic function was severely reduced. - Right atrium: The atrium was moderately dilated. - Tricuspid valve: There was moderate regurgitation. - Pulmonary arteries: Systolic pressure was moderately increased. PA peak pressure: 64 mm Hg (S).     Radiology/Studies  Dg Chest 2 View  08/28/2014   CLINICAL DATA:  Shortness of breath and chest pain.  Mild fever.  EXAM: CHEST  2 VIEW  COMPARISON:  May 19, 2014  FINDINGS: There is no edema or consolidation. Heart is upper normal in size with pulmonary vascularity within normal limits. No adenopathy. No pneumothorax. No bone lesions.  IMPRESSION: No edema or consolidation.   Electronically Signed   By: Bretta Bang III M.D.   On: 08/28/2014 09:47   Mr Brain Wo Contrast  09/07/2014   CLINICAL DATA:  Initial valuation for the increased gassiness, history of AFib, concern for stroke.  EXAM: MRI HEAD WITHOUT CONTRAST  TECHNIQUE: Multiplanar, multiecho pulse sequences of the brain and surrounding structures were obtained without intravenous contrast.  COMPARISON:  Prior study from 12/22/2004.  FINDINGS: No abnormal foci of restricted diffusion to suggest acute intracranial infarct. Gray-white matter differentiation maintained. Normal intravascular flow voids preserved. No acute or chronic intracranial hemorrhage.  Cerebral volume within normal limits for patient age. Minimal T2/FLAIR hyperintensity within the periventricular white matter, like related to chronic small vessel ischemic disease, felt to be within normal limits for patient age. No mass lesion, midline shift, or mass effect. No hydrocephalus. No extra-axial fluid collection.  Craniocervical junction within normal limits. Incidental note made of a partially empty sella. Pituitary gland otherwise unremarkable.  No acute abnormality about the orbits.  Paranasal sinuses are clear. No mastoid effusion. Inner ear structures grossly normal.  Bone marrow signal intensity normal. Scalp  soft tissues within normal limits.  IMPRESSION: Negative brain MRI with no acute intracranial infarct or other process identified.   Electronically Signed   By: Rise Mu M.D.   On: 09/07/2014 04:43   Dg Chest Port 1 View  09/05/2014   CLINICAL DATA:  Follow-up of elevated white blood cell count  EXAM: PORTABLE CHEST - 1 VIEW  COMPARISON:  Chest x-ray of September 03, 2014  FINDINGS: The patient is positioned in a lordotic matter today which limits the apparent expansion of the lungs. The interstitial markings remain increased and there is subsegmental atelectasis at the right lung base. There is a small amount of fluid in the minor fissure. The cardiac silhouette remains enlarged. The pulmonary vascularity is more conspicuous today. The bony thorax exhibits no acute abnormality.  IMPRESSION: CHF with mild interstitial edema. Subsegmental atelectasis at the right lung base. When the patient can tolerate the procedure, a PA and lateral chest x-ray would be useful.   Electronically Signed   By: David  [place] M.D.   On: 09/05/2014 08:17   Dg Abd Acute W/chest  09/03/2014   CLINICAL DATA:  Congestion for 2 weeks.  History of heart murmur. Vomiting.  EXAM: DG ABDOMEN ACUTE W/ 1V CHEST  COMPARISON:  08/30/2014 and 05/19/2014  FINDINGS: Interstitial markings are mildly prominent but similar to the previous examination. Heart size is slightly prominent but stable. The trachea is midline. There is no evidence to suggest free air. Gas in the stomach. Small amount of gas and stool in the colon. Nonobstructive bowel gas pattern. Mild degenerative changes at the pubic symphysis.  IMPRESSION: Slightly prominent lung markings which are similar to the previous examinations. Difficult to exclude mild interstitial edema or vascular congestion.  Gaseous distention of the stomach.   Electronically Signed   By: Richarda Overlie M.D.   On: 09/03/2014 18:51   Ct Cta Abd/pel W/cm &/or W/o Cm  09/03/2014   CLINICAL DATA:   59 year old with left-sided abdominal pain for 2 weeks. New onset of atrial fibrillation.  EXAM: CT ANGIOGRAPHY ABDOMEN AND PELVIS  TECHNIQUE: Multidetector CT imaging of the abdomen and pelvis was performed using the standard protocol during bolus administration of intravenous contrast. Multiplanar reconstructed images including MIPs were obtained and reviewed to evaluate the vascular anatomy.  CONTRAST:  100 mL Omnipaque 350  COMPARISON:  Abdominal radiographs 09/03/2014  FINDINGS: ARTERIAL FINDINGS:  Aorta/Heart: Large filling defects in the posterior left atrium are most compatible with thrombus. The entire left atrium is not imaged. Right atrium appears to be enlarged. Cannot exclude thrombus in the right pulmonary veins. The abdominal aorta is patent without aneurysm or dissection.  Celiac axis: Celiac trunk and main branch vessels are patent, including the splenic artery.  Superior mesenteric: The superior mesenteric artery is patent. Main branch vessels are patent.  Left renal: Occlusion of the left renal artery just beyond the origin. Minimal blood flow to the left kidney even on the delayed images.  Right renal:         Right renal artery is patent.  Inferior mesenteric: Patent.  Left iliac: Left iliac arteries are patent. The proximal left femoral arteries are patent.  Right iliac: Right iliac arteries are patent. The proximal right femoral arteries are patent.  Venous findings: IVC and renal veins are patent. Portal venous system is patent.  Review of the MIP images confirms the above findings.  NONVASCULAR FINDINGS:  There is a small-moderate sized right pleural effusion. Few patchy densities in the right middle lobe may represent atelectasis. No gross abnormality to the liver. There is a small amount of perihepatic ascites and fluid around the gallbladder. Suspect a small amount of periportal edema. No gross abnormality to the spleen or adrenal glands. No gross abnormality to the pancreas. Suspect  scarring along the right kidney lower pole. The left kidney is edematous with minimal arterial flow. Findings compatible with acute vascular occlusion of the left renal artery with left kidney ischemia.  Some of the images are limited by motion artifact. No gross abnormality to the stomach or duodenum. Small amount of free fluid in the pelvis. Uterus is absent. No gross abnormality to the urinary bladder. No gross abnormality to the small or large bowel.  No acute bone abnormality.  IMPRESSION: Large filling defect in the left atrium is suggestive for thrombus. An underlying lesion cannot be excluded. This may be better characterized with an echocardiogram.  Occlusion of the proximal left renal artery with no significant flow to the left kidney. 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 Findings compatible with thromboembolic disease originating from the left atrium.  Small amount of fluid in the  right abdomen and pelvis.  Critical Value/emergent results were called by telephone at the time of interpretation on 09/03/2014 at 8:46 pm to Dr. Benjiman Core , who verbally acknowledged these results.   Electronically Signed   By: Richarda Overlie M.D.   On: 09/03/2014 21:05    ASSESSMENT AND PLAN  1. 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 CT surgery has evaluated. Given large thrombus, they prefer 4-6 weeks of warfarin and possible amiodarone loading to maintain sinus rhythm.   - plan for L&R HC next week once renal function improve, also likely need followup TEE once LA thrombus resolve  - will discuss with MD regarding timing of cath, Cr 2.0 today, maybe her new baseline. Nephrology on board, felt she can have cath in the middle of next week.   2. New onset atrial fibrillation: rate controlled on metoprolol and diltiazem. Continue IV heparin.  Will eventually need 4-6 weeks of warfarin for left atrial thrombus.  3. Acute diastolic HF: continue IV lasix   4. HTN  5. 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 On heparin. Nephrology following.  - Cr 1.2 on 8/22, Cr peaked 8/26 2.38, Cr 2.03 8/28  - CTA of abdomen 09/03/2014 large filling defect in LA suggestive for thrombus, occlusion of prox L renal artery with no significant flow to L kidney, compatible with acute arterial occlusion with L kidney infarction.  Ramond Dial PA-C Pager: [phone]  The patient was seen and examined, and I agree with the assessment and plan as documented above. Creatinine in 2 range. Feels less SOB than yesterday and diuresing well.  Plan for cath later this week.

## 2014-09-09 NOTE — Progress Notes (Signed)
Utilization review completed.  

## 2014-09-09 NOTE — Progress Notes (Signed)
Patient ID: [name] [name], female   DOB: Nov 28, 1955, 59 y.o.   MRN: [id number] S:feels better O:BP 113/71 mmHg  Pulse 63  Temp(Src) 97.7 F (36.5 C) (Oral)  Resp 18  Ht 4\' 11"  (1.499 m)  Wt 116.03 kg (255 lb 12.8 oz)  BMI 51.64 kg/m2  SpO2 100%  Intake/Output Summary (Last 24 hours) at 09/09/14 0854 Last data filed at 09/09/14 0716  Gross per 24 hour  Intake      0 ml  Output   1100 ml  Net  -1100 ml   Intake/Output: I/O last 3 completed shifts: In: 523.4 [P.O.:320; I.V.:203.4] Out: 3050 [Urine:3050]  Intake/Output this shift:  Total I/O In: -  Out: 300 [Urine:300] Weight change: -2.177 kg (-4 lb 12.8 oz) Gen:WD obese AAF in NAD CVS:IRR IRR no rub Resp:decreased BS at bases WUJ:WJXBJ, +BS, soft Ext:3+ edema   Recent Labs Lab 09/03/14 1600 09/03/14 1740  09/04/14 0410 09/04/14 0750 09/05/14 0350 09/06/14 0712 09/07/14 0306 09/08/14 0431 09/09/14 0117  NA 137 137  --  133*  --  132* 128* 125* 129* 128*  K 5.3* 5.6*  < > 5.9* 5.8* 4.9 4.9 4.6 4.2 3.9  CL 104 105  --  103  --  104 99* 96* 99* 96*  CO2 22 22  --  18*  --  17* 19* 20* 16* 22  GLUCOSE 158* 173*  --  177*  --  123* 108* 107* 80 122*  BUN 21* 22*  --  20  --  24* 29* 32* 35* 34*  CREATININE 1.22* 1.25*  --  1.49*  --  1.91* 2.28* 2.38* 2.09* 2.03*  ALBUMIN 3.1*  --   --   --   --   --  2.4* 2.3* 2.5* 2.2*  CALCIUM 8.9 8.9  --  8.7*  --  8.5* 8.5* 8.2* 8.3* 8.0*  PHOS  --   --   --   --   --   --  4.0 4.4 3.7 3.3  AST 60*  --   --   --   --   --   --   --   --   --   ALT 69*  --   --   --   --   --   --   --   --   --   < > = values in this interval not displayed. Liver Function Tests:  Recent Labs Lab 09/03/14 1600  09/07/14 0306 09/08/14 0431 09/09/14 0117  AST 60*  --   --   --   --   ALT 69*  --   --   --   --   ALKPHOS 143*  --   --   --   --   BILITOT 1.0  --   --   --   --   PROT 6.3*  --   --   --   --   ALBUMIN 3.1*  < > 2.3* 2.5* 2.2*  < > = values in this interval not  displayed.  Recent Labs Lab 09/03/14 1600  LIPASE 20*   No results for input(s): AMMONIA in the last 168 hours. CBC:  Recent Labs Lab 09/03/14 1600  09/05/14 0350 09/06/14 0712 09/07/14 0306 09/08/14 0431 09/09/14 0117  WBC 10.7*  < > 26.0* 25.2* 23.2* 18.9* 15.2*  NEUTROABS 9.1*  --   --   --   --   --   --   HGB 13.4  < >  13.3 12.5 12.0 12.8 12.6  HCT 40.1  < > 40.3 37.0 35.8* 37.6 37.7  MCV 88.1  < > 89.6 87.3 86.3 85.6 86.5  PLT 219  < > 226 237 233 255 255  < > = values in this interval not displayed. Cardiac Enzymes: No results for input(s): CKTOTAL, CKMB, CKMBINDEX, TROPONINI in the last 168 hours. CBG:  Recent Labs Lab 09/04/14 0532  GLUCAP 174*    Iron Studies: No results for input(s): IRON, TIBC, TRANSFERRIN, FERRITIN in the last 72 hours. Studies/Results: No results found. . diltiazem  90 mg Oral 3 times per day  . docusate sodium  100 mg Oral BID  . furosemide  40 mg Intravenous Q12H  . metoprolol tartrate  25 mg Oral BID  . mometasone-formoterol  2 puff Inhalation BID  . pantoprazole  40 mg Oral Daily  . pneumococcal 23 valent vaccine  0.5 mL Intramuscular Tomorrow-1000  . pravastatin  40 mg Oral q1800  . senna  2 tablet Oral QHS  . sodium chloride  3 mL Intravenous Q12H  . sodium chloride  3 mL Intravenous Q12H    BMET    Component Value Date/Time   NA 128* 09/09/2014 0117   K 3.9 09/09/2014 0117   CL 96* 09/09/2014 0117   CO2 22 09/09/2014 0117   GLUCOSE 122* 09/09/2014 0117   BUN 34* 09/09/2014 0117   CREATININE 2.03* 09/09/2014 0117   CALCIUM 8.0* 09/09/2014 0117   GFRNONAA 26* 09/09/2014 0117   GFRAA 30* 09/09/2014 0117   CBC    Component Value Date/Time   WBC 15.2* 09/09/2014 0117   RBC 4.36 09/09/2014 0117   HGB 12.6 09/09/2014 0117   HCT 37.7 09/09/2014 0117   PLT 255 09/09/2014 0117   MCV 86.5 09/09/2014 0117   MCH 28.9 09/09/2014 0117   MCHC 33.4 09/09/2014 0117   RDW 15.0 09/09/2014 0117   LYMPHSABS 1.0 09/03/2014  1600   MONOABS 0.6 09/03/2014 1600   EOSABS 0.0 09/03/2014 1600   BASOSABS 0.0 09/03/2014 1600     Assessment/Plan:  1. AKI in setting of acute left renal infarct as well as hypotension and IV contrast. Non-oliguric. Multiple renal insults and Scr continues to climb however the rate of rise has slowed. Continue to hold off of cardiac cath for now but hopefully her Scr will continue to improve and she can have cath in the middle of next week, although she will likely have some residual CKD stage 3.  1. Finally starting to improve, cont to follow 2. Hold off on cardiac cath until Scr reaches nadir (unsure what new baseline will be) and limit contrast exposure and hold diuretics prior to procedure 2. 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 Difficult situation as she will need cardiac cath and likely valve repair but has ARF and left renal infarct so residual CKD expected. Continue to follow. 3. HTN- stable 4. ABLA- transfuse prn 5. Possible UTI per primary 6. Hyponatremia- in setting of ABLA and ARF and edema.  1. Continue iv furosemide and follow sodium and Scr levels and I's/O's 2. Improving with diuresis 7. 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

## 2014-09-09 NOTE — Progress Notes (Signed)
ANTICOAGULATION CONSULT NOTE - Follow Up Consult  Pharmacy Consult for heparin Indication: atrial clot and infaracted kidney.   Allergies  Allergen Reactions  . Codeine Itching    Patient Measurements: Height:  (149.9 cm) Weight: 255 lb 12.8 oz (116.03 kg) IBW/kg (Calculated) : 43.2 Heparin Dosing Weight: 72 kg  Vital Signs: Temp: 97.7 F (36.5 C) (08/28 0548) Temp Source: Oral (08/28 0548) BP: 113/71 mmHg (08/28 0548) Pulse Rate: 63 (08/28 0548)  Labs:  Recent Labs  09/07/14 0306  09/08/14 0431 09/08/14 1520 09/09/14 0117 09/09/14 1030  HGB 12.0  --  12.8  --  12.6  --   HCT 35.8*  --  37.6  --  37.7  --   PLT 233  --  255  --  255  --   HEPARINUNFRC 0.32  < > 0.23* 0.23* 0.41 0.42  CREATININE 2.38*  --  2.09*  --  2.03*  --   < > = values in this interval not displayed.  Estimated Creatinine Clearance: 34.5 mL/min (by C-G formula based on Cr of 2.03).  Assessment:  59yo female on heparin for atrial clot and infarcted kidney. HL therapeutic x 2. HL 0.41>0.42. Hgb 12.6, plts 255.   Goal of Therapy:  Heparin level 0.3-0.7 units/ml Monitor platelets by anticoagulation protocol: Yes   Plan:  Continue heparin gtt at 2250 units/hr Daily CBC/HL Monitor for S&S of bleed.   Sandi Carne, PharmD Pharmacy Resident Pager: [phone] 09/09/2014,12:00 PM

## 2014-09-10 ENCOUNTER — Inpatient Hospital Stay (HOSPITAL_COMMUNITY): Payer: Commercial Managed Care - HMO

## 2014-09-10 LAB — CBC
HCT: 37.7 % (ref 36.0–46.0)
Hemoglobin: 12.4 g/dL (ref 12.0–15.0)
MCH: 29 pg (ref 26.0–34.0)
MCHC: 32.9 g/dL (ref 30.0–36.0)
MCV: 88.1 fL (ref 78.0–100.0)
PLATELETS: 301 10*3/uL (ref 150–400)
RBC: 4.28 MIL/uL (ref 3.87–5.11)
RDW: 15.3 % (ref 11.5–15.5)
WBC: 16.7 10*3/uL — AB (ref 4.0–10.5)

## 2014-09-10 LAB — PULMONARY FUNCTION TEST
FEF 25-75 POST: 0.6 L/s
FEF 25-75 Pre: 1.32 L/sec
FEF2575-%Change-Post: -54 %
FEF2575-%PRED-POST: 35 %
FEF2575-%Pred-Pre: 77 %
FEV1-%Change-Post: -13 %
FEV1-%Pred-Post: 52 %
FEV1-%Pred-Pre: 60 %
FEV1-POST: 0.84 L
FEV1-Pre: 0.97 L
FEV1FVC-%CHANGE-POST: -4 %
FEV1FVC-%Pred-Pre: 108 %
FEV6-%Change-Post: -9 %
FEV6-%PRED-PRE: 56 %
FEV6-%Pred-Post: 51 %
FEV6-Post: 1.01 L
FEV6-Pre: 1.11 L
FEV6FVC-%Pred-Post: 104 %
FEV6FVC-%Pred-Pre: 104 %
FVC-%CHANGE-POST: -9 %
FVC-%PRED-POST: 49 %
FVC-%PRED-PRE: 54 %
FVC-POST: 1.01 L
POST FEV6/FVC RATIO: 100 %
PRE FEV1/FVC RATIO: 87 %
PRE FEV6/FVC RATIO: 100 %
Post FEV1/FVC ratio: 83 %

## 2014-09-10 LAB — RENAL FUNCTION PANEL
ALBUMIN: 2.3 g/dL — AB (ref 3.5–5.0)
Anion gap: 10 (ref 5–15)
BUN: 31 mg/dL — ABNORMAL HIGH (ref 6–20)
CALCIUM: 8.1 mg/dL — AB (ref 8.9–10.3)
CO2: 24 mmol/L (ref 22–32)
CREATININE: 1.81 mg/dL — AB (ref 0.44–1.00)
Chloride: 98 mmol/L — ABNORMAL LOW (ref 101–111)
GFR, EST AFRICAN AMERICAN: 34 mL/min — AB (ref >60)
GFR, EST NON AFRICAN AMERICAN: 30 mL/min — AB (ref >60)
Glucose, Bld: 140 mg/dL — ABNORMAL HIGH (ref 65–99)
PHOSPHORUS: 2.7 mg/dL (ref 2.5–4.6)
Potassium: 4 mmol/L (ref 3.5–5.1)
SODIUM: 132 mmol/L — AB (ref 135–145)

## 2014-09-10 LAB — PROTHROMBIN GENE MUTATION

## 2014-09-10 LAB — FACTOR 5 LEIDEN

## 2014-09-10 LAB — HEPARIN LEVEL (UNFRACTIONATED): Heparin Unfractionated: 0.57 IU/mL (ref 0.30–0.70)

## 2014-09-10 LAB — OSMOLALITY: OSMOLALITY: 279 mosm/kg (ref 275–300)

## 2014-09-10 MED ORDER — ALBUTEROL SULFATE (2.5 MG/3ML) 0.083% IN NEBU
2.5000 mg | INHALATION_SOLUTION | Freq: Once | RESPIRATORY_TRACT | Status: AC
  Administered 2014-09-10: 2.5 mg via RESPIRATORY_TRACT

## 2014-09-10 MED ORDER — METOPROLOL TARTRATE 50 MG PO TABS
50.0000 mg | ORAL_TABLET | Freq: Two times a day (BID) | ORAL | Status: DC
  Administered 2014-09-10 – 2014-09-18 (×14): 50 mg via ORAL
  Filled 2014-09-10 (×15): qty 1

## 2014-09-10 NOTE — Progress Notes (Signed)
Physical medicine and rehabilitation consult requested chart reviewed. Patient has not received follow-up physical or occupational therapy since 09/06/2014. Await follow-up therapies today and follow-up at that time with appropriate recommendations. [employer] unlikely to approve inpatient rehabilitation admission for current diagnosis.

## 2014-09-10 NOTE — Progress Notes (Signed)
Patient Name: [name] [name] Date of Encounter: 09/10/2014  Principal Problem:   Atrial fibrillation with rapid ventricular response Active Problems:   Renal infarction   Hyperkalemia   Left atrial thrombus   Acute renal artery occlusion   Mitral valve stenosis and aortic valve stenosis   Rheumatic mitral stenosis   Rheumatic aortic stenosis   Chronic diastolic heart failure   Acute cystitis without hematuria   Lower extremity edema   Renal insufficiency   Mitral valve stenosis    SUBJECTIVE  Feeling much better than last week. Denies chest pain. Unable to lay flat due to dyspnea.  CURRENT MEDS . diltiazem  90 mg Oral 3 times per day  . docusate sodium  100 mg Oral BID  . furosemide  40 mg Intravenous Q12H  . magnesium chloride  1 tablet Oral BID  . metoprolol tartrate  25 mg Oral BID  . mometasone-formoterol  2 puff Inhalation BID  . pantoprazole  40 mg Oral Daily  . pneumococcal 23 valent vaccine  0.5 mL Intramuscular Tomorrow-1000  . potassium chloride  20 mEq Oral Daily  . pravastatin  40 mg Oral q1800  . senna  2 tablet Oral QHS  . sodium chloride  3 mL Intravenous Q12H    OBJECTIVE  Filed Vitals:   09/09/14 2146 09/10/14 0000 09/10/14 0447 09/10/14 0700  BP:  101/58 114/80   Pulse:  82 63   Temp:  97.3 F (36.3 C) 97.9 F (36.6 C)   TempSrc:  Oral Oral   Resp:  18    Height:      Weight:    253 lb 11.2 oz (115.078 kg)  SpO2: 94% 92% 91%     Intake/Output Summary (Last 24 hours) at 09/10/14 0808 Last data filed at 09/10/14 0659  Gross per 24 hour  Intake    510 ml  Output   3200 ml  Net  -2690 ml   Filed Weights   09/08/14 0530 09/09/14 0548 09/10/14 0700  Weight: 259 lb 4.8 oz (117.618 kg) 255 lb 12.8 oz (116.03 kg) 253 lb 11.2 oz (115.078 kg)    PHYSICAL EXAM  General: Pleasant, NAD. Neuro: Alert and oriented X 3. Moves all extremities spontaneously. Psych: Normal affect. HEENT:  Normal  Neck: Supple without bruits or JVD. Lungs:   Resp regular and unlabored. Diminished breath sound bibasilar.  Heart: irregular no s3, s4. Systolic murmur. Abdomen: Soft, non-tender, mild distended, BS + x 4.  Extremities: No clubbing, cyanosis. DP/PT/Radials 2+ and equal bilaterally. 1-2+ BL LE edema.   Accessory Clinical Findings  CBC  Recent Labs  09/08/14 0431 09/09/14 0117  WBC 18.9* 15.2*  HGB 12.8 12.6  HCT 37.6 37.7  MCV 85.6 86.5  PLT 255 255   Basic Metabolic Panel  Recent Labs  09/09/14 0117 09/10/14 0241  NA 128* 132*  K 3.9 4.0  CL 96* 98*  CO2 22 24  GLUCOSE 122* 140*  BUN 34* 31*  CREATININE 2.03* 1.81*  CALCIUM 8.0* 8.1*  PHOS 3.3 2.7   Liver Function Tests  Recent Labs  09/09/14 0117 09/10/14 0241  ALBUMIN 2.2* 2.3*    TELE  Afib   Echo 09/04/14 LV EF: 50% -  55%  ------------------------------------------------------------------- Indications:   Atrial fibrillation - 427.31.  ------------------------------------------------------------------- History:  PMH: Left Atrial Thrombus. Mitral Stenosis. Dyspnea and murmur. Atrial fibrillation.  ------------------------------------------------------------------- Study Conclusions  - Left ventricle: The cavity size was normal. Wall thickness was increased in a pattern of  moderate LVH. Systolic function was normal. The estimated ejection fraction was in the range of 50% to 55%. Wall motion was normal; there were no regional wall motion abnormalities. - Ventricular septum: The contour showed diastolic flattening. - Aortic valve: There was mild to moderate stenosis. There was trivial regurgitation. Valve area (VTI): 1.38 cm^2. Valve area (Vmax): 1.52 cm^2. Valve area (Vmean): 1.41 cm^2. - Mitral valve: The findings are consistent with severe stenosis. Valve area by continuity equation (using LVOT flow): 1.15 cm^2. - Left atrium: The atrium was mildly dilated. There was a largethrombus. - Right ventricle: Systolic  function was severely reduced. - Right atrium: The atrium was moderately dilated. - Tricuspid valve: There was moderate regurgitation. - Pulmonary arteries: Systolic pressure was moderately increased. PA peak pressure: 64 mm Hg (S).   ASSESSMENT AND PLAN  1. 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 CT surgery recommended  4-6 weeks of warfarin for left atrial thrombus and possible amiodarone loading to maintain sinus rhythm.  - plan for L&R HC later in week once renal function improve (Cr of 1.81 today) and able to lay flat, She was dyspnic last night while laying flat on bed. Likely need followup TEE once LA thrombus resolve - Nephrology on board, felt she can have cath in the middle of week.   2. New onset atrial fibrillation: rate is mildly elevated this morning at rate of 100s. Continue metoprolol and diltiazem, consider increasing dose. Continue IV heparin. Will eventually need 4-6 weeks of warfarin for left atrial thrombus.  3. Acute diastolic HF: continue IV lasix  4. HTN: Relatively stable  5. 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 On heparin. Nephrology following. - Cr 1.2 on 8/22, Cr peaked 8/26 2.38---> now improved. - CTA of abdomen 09/03/2014 large filling defect in LA suggestive for thrombus, occlusion of prox L renal artery with no significant flow to L kidney, compatible with acute arterial occlusion with L kidney infarction.   Signed, Bhagat,Bhavinkumar PA-C  History and all data above reviewed.  Patient examined.  I agree with the findings as above.  She has no pain.  No she is still SOB when trying to lie flat.   The patient exam reveals WUJ:[id number]  ,  Lungs: Decreased breath sounds at the bases  ,  Abd: Positive bowel sounds, no rebound no guarding, Ext Trace edema  .  All available labs, radiology testing,  previous records reviewed. Agree with documented assessment and plan. RHD with MS and AS.  Timing of the cath still in question.  Possibly Wed or Thurs pending renal function.   Given ongoing dyspnea, continue low dose diuresis.   Fayrene Fearing Virgene Tirone  11:42 AM  09/10/2014

## 2014-09-10 NOTE — Progress Notes (Signed)
CARDIAC REHAB PHASE I   PRE:  Rate/Rhythm: 98 afib    BP: sitting 108/68    SaO2: 98 RA  MODE:  Ambulation: 300 ft   POST:  Rate/Rhythm: 159 afib max    BP: sitting 122/72     SaO2: 94 RA  Pt very DOE with increase in HR today to max 159 toward end of walk. Had to sit to rest at 150 ft. as HR was 140s. Down to 90s with rest.  Diaphoretic. Used gait belt, assist x2, RW. Will continue to follow.  [id number]  Elissa Lovett Vanleer CES, ACSM 09/10/2014 10:12 AM

## 2014-09-10 NOTE — Progress Notes (Signed)
ANTICOAGULATION CONSULT NOTE - Follow Up Consult  Pharmacy Consult for heparin Indication: atrial clot and infaracted kidney.   Allergies  Allergen Reactions  . Codeine Itching    Patient Measurements: Height:  (149.9 cm) Weight: 253 lb 11.2 oz (115.078 kg) IBW/kg (Calculated) : 43.2 Heparin Dosing Weight: 72 kg  Vital Signs: Temp: 97.9 F (36.6 C) (08/29 0447) Temp Source: Oral (08/29 0447) BP: 114/80 mmHg (08/29 0447) Pulse Rate: 63 (08/29 0447)  Labs:  Recent Labs  09/08/14 0431  09/09/14 0117 09/09/14 1030 09/10/14 0241  HGB 12.8  --  12.6  --   --   HCT 37.6  --  37.7  --   --   PLT 255  --  255  --   --   HEPARINUNFRC 0.23*  < > 0.41 0.42 0.57  CREATININE 2.09*  --  2.03*  --  1.81*  < > = values in this interval not displayed.  Estimated Creatinine Clearance: 38.5 mL/min (by C-G formula based on Cr of 1.81).  Assessment:  59yo female on heparin for atrial clot and infarcted kidney. HL therapeutic on 2250 units/h. Following daily HLs. CBC wnl.  Doppler (-)VTE. CTS recommends anticoag for at least a month prior to surgery for severe MV/AV stenosis. Postponing preop L/RHC until kidney function improves - likely later this week. No bleeding issues documented.   Goal of Therapy:  Heparin level 0.3-0.7 units/ml Monitor platelets by anticoagulation protocol: Yes   Plan:  - Cont Heparin 2250 units/hr - Daily HL and CBC - F/u long-term AC plan when appropriate - Watch for s/s of bleeding   Babs Bertin, PharmD Clinical Pharmacist Pager [phone] 09/10/2014 8:37 AM

## 2014-09-10 NOTE — Progress Notes (Addendum)
[facility] PROGRESS NOTE  [name] [name] GMW:[id number] DOB: November 21, 1955 DOA: 09/03/2014 PCP: Delorse Lek, MD  Brief Summary  The patient is a 59 year old female with history of asthma/pseudo-asthma who presented to the emergency department with productive cough for 1.5 weeks.  She also had severe left-sided abdominal, flank, and back pain. She was seen 1 week prior to admission by her pulmonologist to gave her medication for bronchitis and respiratory infection.  She was found to be in a-fib with RVR.  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  She was anticoagulated with heparin.  ECHO demonstrated severe mitral valve stenosis and moderate AV stenosis.  Her acute hypoxic respiratory failure, dyspnea, left atrial thrombus, atrial fibrillation, and moderate pulmonary hypertension are all probably secondary to her severe mitral stenosis.  Her course is also complicated by AKI secondary to a complete left renal infarction and her creatinine continues to rise.  She may also have had some contrast induced nephropathy from her CT scan a few days ago.  In the meantime, continuing to work on volume status, rate control and Nephrology is consulting regarding AKI.    Assessment/Plan  Severe mitral valve stenosis/mild to moderate aortic valve stenosis.  She also has severely reduced systolic function of the right ventricle and a moderately dilated RA, PA peak pressure of 64 mmHg suggesting moderate pulmonary hypertension. -  Cardiothoracic surgery recommends anticoagulation for at least 4-6 weeks prior to proceeding with surgery -  Beginning cardiac rehab/PT -  PFTs today -  Carotid arteries 1-39% with antegrade VA flow -  preop RHC/LHC postponed until kidney function nadirs  A. fib with RVR, new onset and likely a complication from severe MR, chads2vasc score of 1  for female gender, 0.6% chance of stroke per year.  She does not need anticoagulation other than aspirin but has other indications for full anticoagulation as below.   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 High risk for more thromboembolic events. - Cardiolipin neg, homocysteine neg, beta2-glycoprotein neg, lupus ac neg, protein S activity and total wnl, protein C activity and total low, antithrombin III low 63. -  Prothrombin and factor V leiden pending -  IV heparin -  Due to her valvular issues, she will require coumadin instead of noac at discharge.    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  Complete loss of left kidney, anticipate new creatinine between 1.5 and 1.8.    Acute kidney injury, creatinine baseline of 0.9 and currently 2.28, trending down -  Probably secondary to renal infarct, heart failure, and IV contrast -  No evidence of obstruction on CT  Hyponatremia, likely hypovolemic from heart failure and improving with diuresis -  Repeat BMP in AM  Asymmetric bilateral lower extremity edema -  Venous duplex LE neg for DVT  Probable peripheral vascular disease -  F/u  ABI  Hypertension, blood pressure low normal  Hyperkalemia, resolved with IV fluids and Kayexalate  Pyuria, urine culture negative.    Leukocytosis may be reactive to kidney infarction and resolving -  CXR without pneumonia and UCx neg  Constipation, resolved -  Continue colace,  senna -  Lactulose prn  Diet:  Healthy heart Access:  PIV IVF:  off Proph:  heparin  Code Status: full Family Communication: patient and her family Disposition Plan:  Pending improvement in kidney function, heart catheterization, on warfarin.  Will likely have outpatient valve replacement.  Will need rehab either CIR (not likely to cover) or SNF.    Consultants:  Cardiology, Dr. Royann Shivers  Vascular surgery by phone, Dr. Zachery Dakins  Nephrology, Dr. Arrie Aran  Cardiothoracic surgery, Dr. Barry Dienes  Procedures:  KUB  CT angio abd/pelvis  ECHO  CXR  Planned:  [facility] once kidney function improved  Carotid duplex:  1-39% stenosis bilaterally  Vascular duplex LE:  Neg for DVT  Antibiotics:  Ceftriaxone 8/22 > 8/26  HPI/Subjective:  Starting to breath more easily.  Family feels that she looks a lot better.  Voiding frequently.  Feels like she is shrinking  Objective: Filed Vitals:   09/10/14 0000 09/10/14 0447 09/10/14 0700 09/10/14 0907  BP: 101/58 114/80    Pulse: 82 63    Temp: 97.3 F (36.3 C) 97.9 F (36.6 C)    TempSrc: Oral Oral    Resp: 18     Height:      Weight:   115.078 kg (253 lb 11.2 oz)   SpO2: 92% 91%  97%    Intake/Output Summary (Last 24 hours) at 09/10/14 1311 Last data filed at 09/10/14 0857  Gross per 24 hour  Intake    510 ml  Output   2900 ml  Net  -2390 ml   Filed Weights   09/08/14 0530 09/09/14 0548 09/10/14 0700  Weight: 117.618 kg (259 lb 4.8 oz) 116.03 kg (255 lb 12.8 oz) 115.078 kg (253 lb 11.2 oz)   Body mass index is 51.21 kg/(m^2).  Exam:   General:  Adult female, NAD.  Face and extremities thinner today  HEENT:  NCAT, MMM  Cardiovascular:  IRRR, 2/6 murmur, rate in the 70s   Respiratory:  Diminished bilateral breath sounds at bases.  No rales, rhonchi or wheeze.   Abdomen:   NABS, soft, nondistended, obese, nontender  MSK:   Normal tone and bulk, 2+ LLE edema and 1+ RLE edema  Neuro:  Diffusely weak  Data  Reviewed: Basic Metabolic Panel:  Recent Labs Lab 09/03/14 1600  09/06/14 0712 09/07/14 0306 09/08/14 0431 09/09/14 0117 09/10/14 0241  NA 137  < > 128* 125* 129* 128* 132*  K 5.3*  < > 4.9 4.6 4.2 3.9 4.0  CL 104  < > 99* 96* 99* 96* 98*  CO2 22  < > 19* 20* 16* 22 24  GLUCOSE 158*  < > 108* 107* 80 122* 140*  BUN 21*  < > 29* 32* 35* 34* 31*  CREATININE 1.22*  < > 2.28* 2.38* 2.09* 2.03* 1.81*  CALCIUM 8.9  < > 8.5* 8.2* 8.3* 8.0* 8.1*  MG 2.1  --   --   --   --   --   --   PHOS  --   --  4.0 4.4 3.7 3.3 2.7  < > = values in this interval not displayed. Liver Function Tests:  Recent Labs Lab 09/03/14 1600 09/06/14 1610 09/07/14 0306 09/08/14 0431  09/09/14 0117 09/10/14 0241  AST 60*  --   --   --   --   --   ALT 69*  --   --   --   --   --   ALKPHOS 143*  --   --   --   --   --   BILITOT 1.0  --   --   --   --   --   PROT 6.3*  --   --   --   --   --   ALBUMIN 3.1* 2.4* 2.3* 2.5* 2.2* 2.3*    Recent Labs Lab 09/03/14 1600  LIPASE 20*   No results for input(s): AMMONIA in the last 168 hours. CBC:  Recent Labs Lab 09/03/14 1600  09/05/14 0350 09/06/14 0712 09/07/14 0306 09/08/14 0431 09/09/14 0117  WBC 10.7*  < > 26.0* 25.2* 23.2* 18.9* 15.2*  NEUTROABS 9.1*  --   --   --   --   --   --   HGB 13.4  < > 13.3 12.5 12.0 12.8 12.6  HCT 40.1  < > 40.3 37.0 35.8* 37.6 37.7  MCV 88.1  < > 89.6 87.3 86.3 85.6 86.5  PLT 219  < > 226 237 233 255 255  < > = values in this interval not displayed.  Recent Results (from the past 240 hour(s))  Urine culture     Status: None   Collection Time: 09/03/14  8:35 PM  Result Value Ref Range Status   Specimen Description URINE, CLEAN CATCH  Final   Special Requests NONE  Final   Culture   Final    MULTIPLE SPECIES PRESENT, SUGGEST RECOLLECTION Performed at [facility]    Report Status 09/05/2014 FINAL  Final  MRSA PCR Screening     Status: None   Collection Time: 09/03/14  9:27 PM  Result Value Ref Range  Status   MRSA by PCR NEGATIVE NEGATIVE Final    Comment:        The GeneXpert MRSA Assay (FDA approved for NASAL specimens only), is one component of a comprehensive MRSA colonization surveillance program. It is not intended to diagnose MRSA infection nor to guide or monitor treatment for MRSA infections.   Urine culture     Status: None   Collection Time: 09/05/14  9:59 AM  Result Value Ref Range Status   Specimen Description URINE, RANDOM  Final   Special Requests ADDED [id number]  Final   Culture NO GROWTH 1 DAY  Final   Report Status 09/06/2014 FINAL  Final     Studies: No results found.  Scheduled Meds: . diltiazem  90 mg Oral 3 times per day  . docusate sodium  100 mg Oral BID  . furosemide  40 mg Intravenous Q12H  . magnesium chloride  1 tablet Oral BID  . metoprolol tartrate  25 mg Oral BID  . mometasone-formoterol  2 puff Inhalation BID  . pantoprazole  40 mg Oral Daily  . pneumococcal 23 valent vaccine  0.5 mL Intramuscular Tomorrow-1000  . potassium chloride  20 mEq Oral Daily  . pravastatin  40 mg Oral q1800  . senna  2 tablet Oral QHS  . sodium chloride  3 mL Intravenous Q12H   Continuous Infusions: . heparin 2,250 Units/hr (09/09/14 2335)    Principal Problem:   Atrial fibrillation with rapid ventricular response Active Problems:   Renal infarction   Hyperkalemia   Left atrial thrombus   Acute  renal artery occlusion   Mitral valve stenosis and aortic valve stenosis   Rheumatic mitral stenosis   Rheumatic aortic stenosis   Chronic diastolic heart failure   Acute cystitis without hematuria   Lower extremity edema   Renal insufficiency   Mitral valve stenosis    Time spent: 30 min    Emerson Schreifels, [facility]  [facility] Pager [phone]. If 7PM-7AM, please contact night-coverage at [email or web address], password [facility] 09/10/2014, 1:11 PM  LOS: 7 days

## 2014-09-10 NOTE — Progress Notes (Signed)
Patient ID: [name] [name], female   DOB: [birth year], 59 y.o.   MRN: [id number] S: Doing well, good uOP, downtrending SCR, still edematous O:BP 114/80 mmHg  Pulse 63  Temp(Src) 97.9 F (36.6 C) (Oral)  Resp 18  Ht 4\' 11"  (1.499 m)  Wt 115.078 kg (253 lb 11.2 oz)  BMI 51.21 kg/m2  SpO2 97%  Intake/Output Summary (Last 24 hours) at 09/10/14 1201 Last data filed at 09/10/14 0857  Gross per 24 hour  Intake    510 ml  Output   2900 ml  Net  -2390 ml   Intake/Output: I/O last 3 completed shifts: In: 630 [P.O.:360; I.V.:270] Out: 4300 [Urine:4300]  Intake/Output this shift:  Total I/O In: 240 [P.O.:240] Out: -  Weight change: -0.953 kg (-2 lb 1.6 oz) Gen:WD obese AAF in NAD CVS:IRR IRR no rub Resp:decreased BS at bases WUJ:WJXBJ, +BS, soft Ext:3+ edema   Recent Labs Lab 09/03/14 1600  09/04/14 0410 09/04/14 0750 09/05/14 0350 09/06/14 0712 09/07/14 0306 09/08/14 0431 09/09/14 0117 09/10/14 0241  NA 137  < > 133*  --  132* 128* 125* 129* 128* 132*  K 5.3*  < > 5.9* 5.8* 4.9 4.9 4.6 4.2 3.9 4.0  CL 104  < > 103  --  104 99* 96* 99* 96* 98*  CO2 22  < > 18*  --  17* 19* 20* 16* 22 24  GLUCOSE 158*  < > 177*  --  123* 108* 107* 80 122* 140*  BUN 21*  < > 20  --  24* 29* 32* 35* 34* 31*  CREATININE 1.22*  < > 1.49*  --  1.91* 2.28* 2.38* 2.09* 2.03* 1.81*  ALBUMIN 3.1*  --   --   --   --  2.4* 2.3* 2.5* 2.2* 2.3*  CALCIUM 8.9  < > 8.7*  --  8.5* 8.5* 8.2* 8.3* 8.0* 8.1*  PHOS  --   --   --   --   --  4.0 4.4 3.7 3.3 2.7  AST 60*  --   --   --   --   --   --   --   --   --   ALT 69*  --   --   --   --   --   --   --   --   --   < > = values in this interval not displayed. Liver Function Tests:  Recent Labs Lab 09/03/14 1600  09/08/14 0431 09/09/14 0117 09/10/14 0241  AST 60*  --   --   --   --   ALT 69*  --   --   --   --   ALKPHOS 143*  --   --   --   --   BILITOT 1.0  --   --   --   --   PROT 6.3*  --   --   --   --   ALBUMIN 3.1*  < > 2.5* 2.2* 2.3*  <  > = values in this interval not displayed.  Recent Labs Lab 09/03/14 1600  LIPASE 20*   No results for input(s): AMMONIA in the last 168 hours. CBC:  Recent Labs Lab 09/03/14 1600  09/05/14 0350 09/06/14 0712 09/07/14 0306 09/08/14 0431 09/09/14 0117  WBC 10.7*  < > 26.0* 25.2* 23.2* 18.9* 15.2*  NEUTROABS 9.1*  --   --   --   --   --   --   HGB  13.4  < > 13.3 12.5 12.0 12.8 12.6  HCT 40.1  < > 40.3 37.0 35.8* 37.6 37.7  MCV 88.1  < > 89.6 87.3 86.3 85.6 86.5  PLT 219  < > 226 237 233 255 255  < > = values in this interval not displayed. Cardiac Enzymes: No results for input(s): CKTOTAL, CKMB, CKMBINDEX, TROPONINI in the last 168 hours. CBG:  Recent Labs Lab 09/04/14 0532  GLUCAP 174*    Iron Studies: No results for input(s): IRON, TIBC, TRANSFERRIN, FERRITIN in the last 72 hours. Studies/Results: No results found. . diltiazem  90 mg Oral 3 times per day  . docusate sodium  100 mg Oral BID  . furosemide  40 mg Intravenous Q12H  . magnesium chloride  1 tablet Oral BID  . metoprolol tartrate  25 mg Oral BID  . mometasone-formoterol  2 puff Inhalation BID  . pantoprazole  40 mg Oral Daily  . pneumococcal 23 valent vaccine  0.5 mL Intramuscular Tomorrow-1000  . potassium chloride  20 mEq Oral Daily  . pravastatin  40 mg Oral q1800  . senna  2 tablet Oral QHS  . sodium chloride  3 mL Intravenous Q12H    BMET    Component Value Date/Time   NA 132* 09/10/2014 0241   K 4.0 09/10/2014 0241   CL 98* 09/10/2014 0241   CO2 24 09/10/2014 0241   GLUCOSE 140* 09/10/2014 0241   BUN 31* 09/10/2014 0241   CREATININE 1.81* 09/10/2014 0241   CALCIUM 8.1* 09/10/2014 0241   GFRNONAA 30* 09/10/2014 0241   GFRAA 34* 09/10/2014 0241   CBC    Component Value Date/Time   WBC 15.2* 09/09/2014 0117   RBC 4.36 09/09/2014 0117   HGB 12.6 09/09/2014 0117   HCT 37.7 09/09/2014 0117   PLT 255 09/09/2014 0117   MCV 86.5 09/09/2014 0117   MCH 28.9 09/09/2014 0117   MCHC 33.4  09/09/2014 0117   RDW 15.0 09/09/2014 0117   LYMPHSABS 1.0 09/03/2014 1600   MONOABS 0.6 09/03/2014 1600   EOSABS 0.0 09/03/2014 1600   BASOSABS 0.0 09/03/2014 1600     Assessment/Plan:  1. AKI in setting of acute left renal infarct as well as hypotension and IV contrast. Non-oliguric.  1. Finally starting to improve, cont to follow 2. Would establish a nadir before LHC 2. 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 Difficult situation as she will need cardiac cath and valve repair but has ARF and left renal infarct so residual CKD expected. Continue to follow.  Surgery will be in several weeks 3. HTN- stable 4. ABLA- transfuse prn 5. Possible UTI per primary 6. Hyponatremia- in setting of ABLA and ARF and edema.  1. Continue iv furosemide and follow sodium and Scr levels and I's/O's 2. Improving with diuresis 7. 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

## 2014-09-10 NOTE — Progress Notes (Addendum)
Occupational Therapy Treatment Patient Details Name: [name] [name] MRN: [id number] DOB: March 28, 1955 Today's Date: 09/10/2014    History of present illness   [name] [name] is a 59 y.o. female with past medical history significant for asthma, hypercholesterolemia, valvular disease and obesity. She presented to the emergency department on 8/22 with complaints of a productive cough and also severe left sided abdominal flank and back pain for 3 days. She was noted to be in atrial fibrillation at the time and that was new for her. She underwent a CT of the abdomen with contrast and that was notable for an atrial thrombus and what was felt to be an acute occlusion of the left renal artery with minimal to no flow to the left kidney.   During hospitalization her HR has been difficult to control with symptoms felt to be related to severe mitral stenosis.  It is felt that she will ultimately need a mitral valve repair/replacement.      OT comments  Pt progressing. HR up to 156 in session.Continue to recommend CIR.  Follow Up Recommendations  CIR;Supervision/Assistance - 24 hour    Equipment Recommendations  3 in 1 bedside comode;Tub/shower bench;(AE)    Recommendations for Other Services      Precautions / Restrictions Precautions Precautions: Fall Precaution Comments: watch HR Restrictions Weight Bearing Restrictions: No       Mobility Bed Mobility               General bed mobility comments: not assessed  Transfers Overall transfer level: Needs assistance Equipment used: Rolling walker (2 wheeled) Transfers: Sit to/from Stand Sit to Stand: Supervision         General transfer comment: cues for hand placement.    Balance                                   ADL Overall ADL's : Needs assistance/impaired     Grooming: Oral care;Set up;Supervision/safety;Sitting;Standing (applied lotion; wiped off mouth; performed oral care standing)                Lower Body Dressing: Minimal assistance;Sitting/lateral leans Lower Body Dressing Details (indicate cue type and reason): socks Toilet Transfer: Min guard;Ambulation;RW;BSC;Supervision/safety (supervision-sit to stand from [facility])   Toileting- Clothing Manipulation and Hygiene: Sit to/from stand;Maximal assistance Toileting - Clothing Manipulation Details (indicate cue type and reason): OT assisted with hygiene; Pt moved gown some when standing.     Functional mobility during ADLs: Min guard;Rolling walker General ADL Comments: Educated on energy conservation. Educated on AE.  Discussed what pt could use for toilet aide.      Vision                     Perception     Praxis      Cognition  Awake/Alert Behavior During Therapy: flat affect  Overall Cognitive Status: Within Functional Limits for tasks assessed                       Extremity/Trunk Assessment               Exercises     Shoulder Instructions       General Comments      Pertinent Vitals/ Pain       Pain Assessment: 0-10 Pain Score: 4  Pain Location: stomach Pain Descriptors / Indicators: Sore Pain Intervention(s): Monitored during session  HR up to 156 in session (AFIB)-nurse notified.  Home Living                                          Prior Functioning/Environment              Frequency Min 2X/week     Progress Toward Goals  OT Goals(current goals can now be found in the care plan section)  Progress towards OT goals: Progressing toward goals-updated a goal  Acute Rehab OT Goals Patient Stated Goal: not stated OT Goal Formulation: With patient Time For Goal Achievement: 09/20/14 Potential to Achieve Goals: Good ADL Goals Pt Will Perform Grooming: standing;with set-up;with supervision (4 tasks) Pt Will Perform Upper Body Bathing: with set-up;sitting Pt Will Perform Lower Body Bathing: with min guard assist;sit to/from stand Pt Will Perform  Upper Body Dressing: with set-up;sitting Pt Will Perform Lower Body Dressing: with min guard assist;sit to/from stand Pt Will Transfer to Toilet: with min guard assist;ambulating;bedside commode;grab bars;regular height toilet Pt Will Perform Toileting - Clothing Manipulation and hygiene: with min guard assist;sit to/from stand Pt/caregiver will Perform Home Exercise Program: Increased strength;Both right and left upper extremity;With theraband;With written HEP provided;Independently  Plan Discharge plan remains appropriate    Co-evaluation                 End of Session Equipment Utilized During Treatment: Gait belt;Rolling walker   Activity Tolerance Patient limited by fatigue (limited by pt being connected to monitor in room)   Patient Left in chair;with call bell/phone within reach;with family/visitor present   Nurse Communication Other (comment) (HR)        Time: 1610-9604 OT Time Calculation (min): 20 min  Charges: OT General Charges $OT Visit: 1 Procedure OT Treatments $Self Care/Home Management : 8-22 mins  Earlie Raveling OTR/L [phone] 09/10/2014, 5:23 PM

## 2014-09-10 NOTE — Progress Notes (Signed)
PT Cancellation Note  Patient Details Name: [name] [name] MRN: [id number] DOB: Aug 24, 1955   Cancelled Treatment:    Reason Eval/Treat Not Completed: Fatigue/lethargy limiting ability to participate (pt stated she's fatigued from walking with cardiac rehab this morning, she requested PT attempt tomorrow. Will follow. )   Tamala Ser 09/10/2014, 11:53 AM [phone]

## 2014-09-11 DIAGNOSIS — I342 Nonrheumatic mitral (valve) stenosis: Secondary | ICD-10-CM

## 2014-09-11 LAB — RENAL FUNCTION PANEL
ALBUMIN: 2.3 g/dL — AB (ref 3.5–5.0)
ANION GAP: 10 (ref 5–15)
BUN: 29 mg/dL — ABNORMAL HIGH (ref 6–20)
CALCIUM: 8.3 mg/dL — AB (ref 8.9–10.3)
CO2: 23 mmol/L (ref 22–32)
Chloride: 97 mmol/L — ABNORMAL LOW (ref 101–111)
Creatinine, Ser: 1.5 mg/dL — ABNORMAL HIGH (ref 0.44–1.00)
GFR, EST AFRICAN AMERICAN: 43 mL/min — AB (ref >60)
GFR, EST NON AFRICAN AMERICAN: 37 mL/min — AB (ref >60)
Glucose, Bld: 101 mg/dL — ABNORMAL HIGH (ref 65–99)
PHOSPHORUS: 2.3 mg/dL — AB (ref 2.5–4.6)
POTASSIUM: 4.4 mmol/L (ref 3.5–5.1)
SODIUM: 130 mmol/L — AB (ref 135–145)

## 2014-09-11 LAB — HEPARIN LEVEL (UNFRACTIONATED): HEPARIN UNFRACTIONATED: 0.62 [IU]/mL (ref 0.30–0.70)

## 2014-09-11 NOTE — Progress Notes (Addendum)
[facility] PROGRESS NOTE  RASHA IBE ZOX:[id number] DOB: [birth year] DOA: 09/03/2014 PCP: Delorse Lek, MD  Brief Summary  The patient is a 59 year old female with history of asthma/pseudo-asthma who presented to the emergency department with productive cough for 1.5 weeks.  She also had severe left-sided abdominal, flank, and back pain. She was seen 1 week prior to admission by her pulmonologist to gave her medication for bronchitis and respiratory infection.  She was found to be in a-fib with RVR.  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  She was anticoagulated with heparin.  ECHO demonstrated severe mitral valve stenosis and moderate AV stenosis.  Her acute hypoxic respiratory failure, dyspnea, left atrial thrombus, atrial fibrillation, and moderate pulmonary hypertension are all probably secondary to her severe mitral stenosis.  Her course is also complicated by AKI secondary to a complete left renal infarction which is gradually resolving.  She has been awaiting improvement in her kidney function and enough diuresis to lie flat for right and left heart cath.  Continuing to work on volume status, rate control and Nephrology is consulting regarding AKI.  Most of her pre-surgery work up has been completed during this hospitalization.  Once she is adequately diuresed and has completed her right and left heart catheterization, she will need to be transitioned to warfarin and bridged possibly with lovenox.  She will undergo valve replacement surgery in 4-6 weeks.    Assessment/Plan  Severe mitral valve stenosis/mild to moderate aortic valve stenosis.  She also has severely reduced systolic function of the right ventricle and a moderately dilated RA, PA peak pressure of 64 mmHg suggesting moderate pulmonary hypertension. -  Cardiothoracic surgery recommends  anticoagulation for at least 4-6 weeks prior to proceeding with surgery -  Beginning cardiac rehab/PT -  PFTs completed with possible moderate restrictive lung disease -  Carotid arteries 1-39% with antegrade VA flow -  preop RHC/LHC postponed until kidney function nadirs, able to lie flat, possibly Thursday  A. fib with RVR, new onset and likely a complication from severe MR, chads2vasc score of 1 for female gender, 0.6% chance of stroke per year.  She does not need anticoagulation other than aspirin but has other indications for full anticoagulation as below.   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  High risk for more thromboembolic events.  Hypercoag panel negative except for mildly low protein C level and activity and mildly low antithrombin III which were likely due to acute thrombus.  (Cardiolipin, homocysteine, beta2-glycoprotein, lupus ac, protein S activity and total, prothrombin, and factor V leiden all negative.) -  IV heparin per pharmacy -  Due to her valvular issues, she will require coumadin instead of noac at discharge.    Acute hypoxic respiratory failure secondary to acute diastolic heart failure, resolving -  On RA -  Continue lasix 40mg  IV BID -  Wt decreasing -  -2.37L yesterday  Left renal infarction likely due to thromboembolic event from left atrial thrombus.  Complete loss of left kidney, anticipate new creatinine between 1.5 and 1.8.    Acute kidney injury, creatinine  baseline of 0.9 and currently 2.28, trending down -  Probably secondary to renal infarct, heart failure, and IV contrast -  No evidence of obstruction on CT  Hyponatremia, likely hypovolemic from heart failure and improving with  diuresis -  Repeat BMP in AM  Asymmetric bilateral lower extremity edema -  Venous duplex LE neg for DVT  Hypertension, blood pressure low normal  Hyperkalemia due to AKI, resolved with IV fluids and Kayexalate  Pyuria, urine culture negative.    Leukocytosis may be reactive to kidney infarction and resolving -  CXR without pneumonia and UCx neg  Constipation, resolved -  Continue colace, senna -  Lactulose prn  Diet:  Healthy heart Access:  PIV IVF:  off Proph:  heparin  Code Status: full Family Communication: patient and her family Disposition Plan:  Pending improvement in kidney function, heart catheterization, on warfarin.  Will likely have outpatient valve replacement.  Will need rehab either CIR (not likely to cover) or home with [facility] services depending on progression  Consultants:  Cardiology, Dr. Royann Shivers  Vascular surgery by phone, Dr. Zachery Dakins  Nephrology, Dr. Arrie Aran  Cardiothoracic surgery, Dr. Barry Dienes  Procedures:  KUB  CT angio abd/pelvis  ECHO  CXR  Planned:  [facility] once kidney function improved  Carotid duplex:  1-39% stenosis bilaterally  Vascular duplex LE:  Neg for DVT  PFTs   Antibiotics:  Ceftriaxone 8/22 > 8/26  HPI/Subjective:  Legs are better and breathing is easier but still SOB when she lies flat.  Still having good uop.    Objective: Filed Vitals:   09/10/14 2235 09/11/14 0558 09/11/14 0743 09/11/14 1008  BP: 110/86 106/67  104/63  Pulse:  86  105  Temp:  97.5 F (36.4 C)    TempSrc:  Oral    Resp:  20    Height:      Weight:  114.306 kg (252 lb)    SpO2:  95% 99%     Intake/Output Summary (Last 24 hours) at 09/11/14 1154 Last data filed at 09/11/14 0700  Gross per 24 hour  Intake  878.5 ml  Output   3600 ml  Net -2721.5 ml   Filed Weights   09/09/14 0548 09/10/14 0700 09/11/14 0558  Weight: 116.03 kg (255 lb 12.8 oz) 115.078 kg (253 lb 11.2 oz) 114.306 kg (252 lb)   Body mass index is 50.87  kg/(m^2).  Exam:   General:  Adult female, NAD, sitting up in recliner  HEENT:  NCAT, MMM  Cardiovascular:  IRRR, 2/6 murmur, rate in the 70s   Respiratory:  Faint rales at the bases and diminished at bases, no rhonchi or wheeze.   Abdomen:   NABS, soft, nondistended, obese, nontender  MSK:   Normal tone and bulk, 2+ LLE edema and 1+ RLE edema  Neuro:  Diffusely weak  Data Reviewed: Basic Metabolic Panel:  Recent Labs Lab 09/07/14 0306 09/08/14 0431 09/09/14 0117 09/10/14 0241 09/11/14 0543  NA 125* 129* 128* 132* 130*  K 4.6 4.2 3.9 4.0 4.4  CL 96* 99* 96* 98* 97*  CO2 20* 16* GLUCOSE 107* 80 122* 140* 101*  BUN 32* 35* 34* 31* 29*  CREATININE 2.38* 2.09* 2.03* 1.81* 1.50*  CALCIUM 8.2* 8.3* 8.0* 8.1* 8.3*  PHOS 4.4 3.7 3.3 2.7 2.3*   Liver Function Tests:  Recent Labs Lab 09/07/14 0306 09/08/14 0431 09/09/14 0117 09/10/14 0241 09/11/14 0543  ALBUMIN 2.3* 2.5* 2.2* 2.3* 2.3*   No results for input(s):  LIPASE, AMYLASE in the last 168 hours. No results for input(s): AMMONIA in the last 168 hours. CBC:  Recent Labs Lab 09/06/14 0712 09/07/14 0306 09/08/14 0431 09/09/14 0117 09/10/14 1515  WBC 25.2* 23.2* 18.9* 15.2* 16.7*  HGB 12.5 12.0 12.8 12.6 12.4  HCT 37.0 35.8* 37.6 37.7 37.7  MCV 87.3 86.3 85.6 86.5 88.1  PLT 237 233 255 255 301    Recent Results (from the past 240 hour(s))  Urine culture     Status: None   Collection Time: 09/03/14  8:35 PM  Result Value Ref Range Status   Specimen Description URINE, CLEAN CATCH  Final   Special Requests NONE  Final   Culture   Final    MULTIPLE SPECIES PRESENT, SUGGEST RECOLLECTION Performed at [facility]    Report Status 09/05/2014 FINAL  Final  MRSA PCR Screening     Status: None   Collection Time: 09/03/14  9:27 PM  Result Value Ref Range Status   MRSA by PCR NEGATIVE NEGATIVE Final    Comment:        The GeneXpert MRSA Assay (FDA approved for NASAL specimens only), is  one component of a comprehensive MRSA colonization surveillance program. It is not intended to diagnose MRSA infection nor to guide or monitor treatment for MRSA infections.   Urine culture     Status: None   Collection Time: 09/05/14  9:59 AM  Result Value Ref Range Status   Specimen Description URINE, RANDOM  Final   Special Requests ADDED [id number]  Final   Culture NO GROWTH 1 DAY  Final   Report Status 09/06/2014 FINAL  Final     Studies: No results found.  Scheduled Meds: . diltiazem  90 mg Oral 3 times per day  . docusate sodium  100 mg Oral BID  . furosemide  40 mg Intravenous Q12H  . magnesium chloride  1 tablet Oral BID  . metoprolol tartrate  50 mg Oral BID  . mometasone-formoterol  2 puff Inhalation BID  . pantoprazole  40 mg Oral Daily  . pneumococcal 23 valent vaccine  0.5 mL Intramuscular Tomorrow-1000  . potassium chloride  20 mEq Oral Daily  . pravastatin  40 mg Oral q1800  . senna  2 tablet Oral QHS  . sodium chloride  3 mL Intravenous Q12H   Continuous Infusions: . heparin 2,250 Units/hr (09/11/14 0426)    Principal Problem:   Atrial fibrillation with rapid ventricular response Active Problems:   Renal infarction   Hyperkalemia   Left atrial thrombus   Acute renal artery occlusion   Mitral valve stenosis and aortic valve stenosis   Rheumatic mitral stenosis   Rheumatic aortic stenosis   Chronic diastolic heart failure   Acute cystitis without hematuria   Lower extremity edema   Renal insufficiency   Mitral valve stenosis    Time spent: 30 min    Edric Fetterman, [facility]  [facility] Pager [phone]. If 7PM-7AM, please contact night-coverage at [email or web address], password [facility] 09/11/2014, 11:54 AM  LOS: 8 days

## 2014-09-11 NOTE — Progress Notes (Signed)
Physical medicine rehabilitation continues to follow patient multi-medical and plan for heart catheterization later this week for workup ongoing of severe mitral stenosis and mild to moderate aortic stenosis. Therapies overall have been limited. Will continue to follow

## 2014-09-11 NOTE — Progress Notes (Signed)
Patient Name: [name] [name] Date of Encounter: 09/11/2014  Principal Problem:   Atrial fibrillation with rapid ventricular response Active Problems:   Renal infarction   Hyperkalemia   Left atrial thrombus   Acute renal artery occlusion   Mitral valve stenosis and aortic valve stenosis   Rheumatic mitral stenosis   Rheumatic aortic stenosis   Chronic diastolic heart failure   Acute cystitis without hematuria   Lower extremity edema   Renal insufficiency   Mitral valve stenosis    Primary Cardiologist: Dr. Patty Sermons Patient Profile: 60yo female w/ PMH of mitral stenosis and aortic stenosis who was admitted on 09/03/14 for left arterial thrombus and thromboembolic occlusion of left renal artery and found to be in new onset atrial fibrillation w/ RVR at that time.  SUBJECTIVE: Still having difficulty lying flat. Reports sleeping in the recliner in her room to help with her breathing as well to get to the restroom quicker due to the Lasix. Denies any chest pain or palpitations.  OBJECTIVE Filed Vitals:   09/10/14 2225 09/10/14 2235 09/11/14 0558 09/11/14 0743  BP:  110/86 106/67   Pulse: 145  86   Temp:   97.5 F (36.4 C)   TempSrc:   Oral   Resp:   20   Height:      Weight:   252 lb (114.306 kg)   SpO2:   95% 99%    Intake/Output Summary (Last 24 hours) at 09/11/14 0834 Last data filed at 09/11/14 0700  Gross per 24 hour  Intake 1118.5 ml  Output   3600 ml  Net -2481.5 ml   Filed Weights   09/09/14 0548 09/10/14 0700 09/11/14 0558  Weight: 255 lb 12.8 oz (116.03 kg) 253 lb 11.2 oz (115.078 kg) 252 lb (114.306 kg)    PHYSICAL EXAM General: Well developed, well nourished, African American female in no acute distress. Head: Normocephalic, atraumatic.  Neck: Supple without bruits, JVD not elevated. Lungs:  Resp regular and unlabored, Rales noted as bases bilaterally. Heart: Irregularly irregular, no S3, S4, or murmur; no rub. Abdomen: Soft, non-tender,  non-distended with normoactive bowel sounds. No hepatomegaly. No rebound/guarding. No obvious abdominal masses. Extremities: No clubbing or cyanosis, 2+ edema bilaterally up to mid-shins. Distal pedal pulses are 2+ bilaterally. Neuro: Alert and oriented X 3. Moves all extremities spontaneously. Psych: Normal affect.  LABS: CBC: Recent Labs  09/09/14 0117 09/10/14 1515  WBC 15.2* 16.7*  HGB 12.6 12.4  HCT 37.7 37.7  MCV 86.5 88.1  PLT 255 301   Basic Metabolic Panel: Recent Labs  09/10/14 0241 09/11/14 0543  NA 132* 130*  K 4.0 4.4  CL 98* 97*  CO2 24 23  GLUCOSE 140* 101*  BUN 31* 29*  CREATININE 1.81* 1.50*  CALCIUM 8.1* 8.3*  PHOS 2.7 2.3*   Liver Function Tests: Recent Labs  09/10/14 0241 09/11/14 0543  ALBUMIN 2.3* 2.3*    TELE: Atrial Fibrillation with rate in 80's -110's.        ECHO: 09/04/2014 Study Conclusions - Left ventricle: The cavity size was normal. Wall thickness was increased in a pattern of moderate LVH. Systolic function was normal. The estimated ejection fraction was in the range of 50% to 55%. Wall motion was normal; there were no regional wall motion abnormalities. - Ventricular septum: The contour showed diastolic flattening. - Aortic valve: There was mild to moderate stenosis. There was trivial regurgitation. Valve area (VTI): 1.38 cm^2. Valve area (Vmax): 1.52 cm^2. Valve area (Vmean):  1.41 cm^2. - Mitral valve: The findings are consistent with severe stenosis. Valve area by continuity equation (using LVOT flow): 1.15 cm^2. - Left atrium: The atrium was mildly dilated. There was a largethrombus. - Right ventricle: Systolic function was severely reduced. - Right atrium: The atrium was moderately dilated. - Tricuspid valve: There was moderate regurgitation. - Pulmonary arteries: Systolic pressure was moderately increased. PA peak pressure: 64 mm Hg (S).   Current Medications:  . diltiazem  90 mg Oral 3 times per  day  . docusate sodium  100 mg Oral BID  . furosemide  40 mg Intravenous Q12H  . magnesium chloride  1 tablet Oral BID  . metoprolol tartrate  50 mg Oral BID  . mometasone-formoterol  2 puff Inhalation BID  . pantoprazole  40 mg Oral Daily  . pneumococcal 23 valent vaccine  0.5 mL Intramuscular Tomorrow-1000  . potassium chloride  20 mEq Oral Daily  . pravastatin  40 mg Oral q1800  . senna  2 tablet Oral QHS  . sodium chloride  3 mL Intravenous Q12H   . heparin 2,250 Units/hr (09/11/14 0426)    ASSESSMENT AND PLAN: 1. 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 CT surgery recommended 4-6 weeks of warfarin for left atrial thrombus and possible amiodarone loading to maintain sinus rhythm.  - plan for L&R HC later in week once renal function improve (Cr of 1.50 on 09/11/14, improved from 2.09 three days prior) and able to lay flat.  - Nephrology on board, felt she can have cath in the middle of week. Will need to be added to cath schedule when appropriate.  2. New onset atrial fibrillation:  - Rate was in 110's with peaks in the 150's on 09/10/14 but with Metoprolol increase from 25mg  BID to 50mg  BID her rate has responded well and is now in the 80's - 110's.  This patients CHA2DS2-VASc Score and unadjusted Ischemic Stroke Rate (% per year) is equal to 3.2 % stroke rate/year from a score of 3 ( Female, Embolic Event) - Continue metoprolol 50mg  BID and diltiazem 90mg  TID. - Continue IV heparin. Will eventually need 4-6 weeks of warfarin for left atrial thrombus.  3. Acute diastolic HF:  - 5.5L since admission, but still volume overloaded on exam. - continue IV lasix 40mg  BID.  4. HTN: - Has been  106/64 - 136/86 in the past 24 hours.  5. 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  - CTA of abdomen 09/03/2014 large filling defect in LA  suggestive for thrombus, occlusion of prox L renal artery with no significant flow to L kidney, compatible with acute arterial occlusion with L kidney infarction. - currently on heparin.  - Nephrology following. - Cr 1.2 on 8/22, peaked on 8/26 at 2.38, currently 1.50 on 09/11/14.  Otherwise, per IM.  Alexis Frock , PA-C 8:34 AM 09/11/2014 Pager: [phone] Agree with above assessment. Renal function is improving. She is still significantly volume overloaded with peripheral edema and bibasilar rales. Heart reveals basilar systolic murmur.  Unable to appreciate any mitral rumble as she is in the sitting position. I believe she will be ready for right and left heart cath Thursday. She should be able to lie flat adequately by then. Currently has been sleeping in recliner.

## 2014-09-11 NOTE — Progress Notes (Signed)
Patient ID: [name] [name], female   DOB: 10/03/1955, 59 y.o.   MRN: [id number] S: no new events O:BP 104/63 mmHg  Pulse 105  Temp(Src) 97.5 F (36.4 C) (Oral)  Resp 20  Ht  (1.499 m)  Wt 114.306 kg (252 lb)  BMI 50.87 kg/m2  SpO2 99%  Intake/Output Summary (Last 24 hours) at 09/11/14 1217 Last data filed at 09/11/14 0700  Gross per 24 hour  Intake  878.5 ml  Output   3600 ml  Net -2721.5 ml   Intake/Output: I/O last 3 completed shifts: In: 1388.5 [P.O.:850; I.V.:538.5] Out: 5100 [Urine:5100]  Intake/Output this shift:    Weight change: -0.771 kg (-1 lb 11.2 oz) Gen:WD obese AAF in NAD CVS:IRR IRR no rub Resp:decreased BS at bases NGE:XBMWU, +BS, soft Ext:3+ edema   Recent Labs Lab 09/05/14 0350 09/06/14 0712 09/07/14 0306 09/08/14 0431 09/09/14 0117 09/10/14 0241 09/11/14 0543  NA 132* 128* 125* 129* 128* 132* 130*  K 4.9 4.9 4.6 4.2 3.9 4.0 4.4  CL 104 99* 96* 99* 96* 98* 97*  CO2 17* 19* 20* 16* GLUCOSE 123* 108* 107* 80 122* 140* 101*  BUN 24* 29* 32* 35* 34* 31* 29*  CREATININE 1.91* 2.28* 2.38* 2.09* 2.03* 1.81* 1.50*  ALBUMIN  --  2.4* 2.3* 2.5* 2.2* 2.3* 2.3*  CALCIUM 8.5* 8.5* 8.2* 8.3* 8.0* 8.1* 8.3*  PHOS  --  4.0 4.4 3.7 3.3 2.7 2.3*   Liver Function Tests:  Recent Labs Lab 09/09/14 0117 09/10/14 0241 09/11/14 0543  ALBUMIN 2.2* 2.3* 2.3*   No results for input(s): LIPASE, AMYLASE in the last 168 hours. No results for input(s): AMMONIA in the last 168 hours. CBC:  Recent Labs Lab 09/06/14 0712 09/07/14 0306 09/08/14 0431 09/09/14 0117 09/10/14 1515  WBC 25.2* 23.2* 18.9* 15.2* 16.7*  HGB 12.5 12.0 12.8 12.6 12.4  HCT 37.0 35.8* 37.6 37.7 37.7  MCV 87.3 86.3 85.6 86.5 88.1  PLT 237 233 255 255 301   Cardiac Enzymes: No results for input(s): CKTOTAL, CKMB, CKMBINDEX, TROPONINI in the last 168 hours. CBG: No results for input(s): GLUCAP in the last 168 hours.  Iron Studies: No results for input(s): IRON,  TIBC, TRANSFERRIN, FERRITIN in the last 72 hours. Studies/Results: No results found. . diltiazem  90 mg Oral 3 times per day  . docusate sodium  100 mg Oral BID  . furosemide  40 mg Intravenous Q12H  . magnesium chloride  1 tablet Oral BID  . metoprolol tartrate  50 mg Oral BID  . mometasone-formoterol  2 puff Inhalation BID  . pantoprazole  40 mg Oral Daily  . pneumococcal 23 valent vaccine  0.5 mL Intramuscular Tomorrow-1000  . potassium chloride  20 mEq Oral Daily  . pravastatin  40 mg Oral q1800  . senna  2 tablet Oral QHS  . sodium chloride  3 mL Intravenous Q12H    BMET    Component Value Date/Time   NA 130* 09/11/2014 0543   K 4.4 09/11/2014 0543   CL 97* 09/11/2014 0543   CO2 23 09/11/2014 0543   GLUCOSE 101* 09/11/2014 0543   BUN 29* 09/11/2014 0543   CREATININE 1.50* 09/11/2014 0543   CALCIUM 8.3* 09/11/2014 0543   GFRNONAA 37* 09/11/2014 0543   GFRAA 43* 09/11/2014 0543   CBC    Component Value Date/Time   WBC 16.7* 09/10/2014 1515   RBC 4.28 09/10/2014 1515   HGB 12.4 09/10/2014 1515   HCT 37.7  09/10/2014 1515   PLT 301 09/10/2014 1515   MCV 88.1 09/10/2014 1515   MCH 29.0 09/10/2014 1515   MCHC 32.9 09/10/2014 1515   RDW 15.3 09/10/2014 1515   LYMPHSABS 1.0 09/03/2014 1600   MONOABS 0.6 09/03/2014 1600   EOSABS 0.0 09/03/2014 1600   BASOSABS 0.0 09/03/2014 1600     Assessment/Plan:  1. AKI in setting of acute left renal infarct as well as hypotension and IV contrast. Non-oliguric.  1. Continues to improve, cont to follow 2. I think at this point safe to proceed with LHC  From renival view 2. 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 Difficult situation as she will need cardiac cath and valve repair but has ARF and left renal infarct so residual CKD expected. Continue to follow.  Surgery will be in several weeks 3. HTN- stable 4. Hyponatremia- stable,  mild 1. Continue iv furosemide and follow sodium and Scr levels and I's/O's 2. Improving with diuresis 5. 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

## 2014-09-11 NOTE — Progress Notes (Signed)
PT Cancellation Note  Patient Details Name: [name] [name] MRN: [id number] DOB: 20-Jul-1955   Cancelled Treatment:    Reason Eval/Treat Not Completed: Patient declined, no reason specified.  Pt reports that she has been on and off of the commode all day and is exhausted and not feeling well.  She would like to try to walk tomorrow AM around 9.  Pt to attempt to come back at that time. Thanks,   Rollene Rotunda. Wyland Rastetter, PT, DPT [phone]   09/11/2014, 4:18 PM

## 2014-09-11 NOTE — Progress Notes (Signed)
      [address]       [place] [address]             [phone]     CARDIOTHORACIC SURGERY PROGRESS NOTE  Subjective: Feels better.  Breathing improved  Objective: Vital signs in last 24 hours: Temp:  [97.5 F (36.4 C)-97.8 F (36.6 C)] 97.5 F (36.4 C) (08/30 0558) Pulse Rate:  [84-145] 105 (08/30 1008) Cardiac Rhythm:  [-] Atrial fibrillation (08/30 0800) Resp:  [20-26] 20 (08/30 0558) BP: (104-136)/(63-86) 108/64 mmHg (08/30 1439) SpO2:  [95 %-99 %] 99 % (08/30 0743) Weight:  [114.306 kg (252 lb)] 114.306 kg (252 lb) (08/30 0558)  Physical Exam:  Rhythm:   Afib  Breath sounds: Few basilar crackles but mostly clear  Heart sounds:  Irregular w/ systolic murmur RUSB  Incisions:  n/a  Abdomen:  soft  Extremities:  warm   Intake/Output from previous day: 08/29 0701 - 08/30 0700 In: 1118.5 [P.O.:850; I.V.:268.5] Out: 3600 [Urine:3600] Intake/Output this shift:    Lab Results:  Recent Labs  09/09/14 0117 09/10/14 1515  WBC 15.2* 16.7*  HGB 12.6 12.4  HCT 37.7 37.7  PLT 255 301   BMET:  Recent Labs  09/10/14 0241 09/11/14 0543  NA 132* 130*  K 4.0 4.4  CL 98* 97*  CO2 24 23  GLUCOSE 140* 101*  BUN 31* 29*  CREATININE 1.81* 1.50*  CALCIUM 8.1* 8.3*    CBG (last 3)  No results for input(s): GLUCAP in the last 72 hours. PT/INR:  No results for input(s): LABPROT, INR in the last 72 hours.  CXR:  N/A  Assessment/Plan:  Clinically stable/improved w/ rate control and diuresis Renal function continues to improve Cath pending  Would start warfarin once cath has been performed and plan f/u TEE in 4-6 weeks to verify LA thrombus has resolved and further evaluate severity of AS  Will continue to follow and arrange for f/u appointment in our office after TEE has been done.   I spent in excess of 15 minutes during the conduct of this hospital encounter and >50% of this time involved direct face-to-face encounter with the patient for  counseling and/or coordination of their care.   Purcell Nails 09/11/2014 6:08 PM

## 2014-09-11 NOTE — Progress Notes (Signed)
ANTICOAGULATION CONSULT NOTE - Follow Up Consult  Pharmacy Consult for heparin Indication: atrial clot and infaracted kidney.   Allergies  Allergen Reactions  . Codeine Itching    Patient Measurements: Height:  (149.9 cm) Weight: 252 lb (114.306 kg) IBW/kg (Calculated) : 43.2 Heparin Dosing Weight: 72 kg  Vital Signs: Temp: 97.5 F (36.4 C) (08/30 0558) Temp Source: Oral (08/30 0558) BP: 106/67 mmHg (08/30 0558) Pulse Rate: 86 (08/30 0558)  Labs:  Recent Labs  09/09/14 0117 09/09/14 1030 09/10/14 0241 09/10/14 1515 09/11/14 0543  HGB 12.6  --   --  12.4  --   HCT 37.7  --   --  37.7  --   PLT 255  --   --  301  --   HEPARINUNFRC 0.41 0.42 0.57  --  0.62  CREATININE 2.03*  --  1.81*  --  1.50*    Estimated Creatinine Clearance: 46.2 mL/min (by C-G formula based on Cr of 1.5).  Assessment:  59yo female on heparin for atrial clot and infarcted kidney. HL therapeutic on 2250 units/h. Following daily HLs. CBC wnl.  Doppler (-)VTE. CTS recommends anticoag for at least a month prior to surgery for severe MV/AV stenosis. Postponing preop L/RHC until kidney function improves - likely later this week when nadir established. No bleeding issues documented. Will need coumadin once at d/c once procedures completed.   Goal of Therapy:  Heparin level 0.3-0.7 units/ml Monitor platelets by anticoagulation protocol: Yes   Plan:  - Cont Heparin 2250 units/hr - Daily HL and CBC - Planning coumadin when all procedures completed - Watch for s/s of bleeding   Babs Bertin, PharmD Clinical Pharmacist Pager [phone] 09/11/2014 8:22 AM

## 2014-09-12 LAB — CBC
HEMATOCRIT: 35.9 % — AB (ref 36.0–46.0)
HEMOGLOBIN: 11.7 g/dL — AB (ref 12.0–15.0)
MCH: 28.4 pg (ref 26.0–34.0)
MCHC: 32.6 g/dL (ref 30.0–36.0)
MCV: 87.1 fL (ref 78.0–100.0)
Platelets: 316 10*3/uL (ref 150–400)
RBC: 4.12 MIL/uL (ref 3.87–5.11)
RDW: 15.2 % (ref 11.5–15.5)
WBC: 18.1 10*3/uL — AB (ref 4.0–10.5)

## 2014-09-12 LAB — RENAL FUNCTION PANEL
Albumin: 2.3 g/dL — ABNORMAL LOW (ref 3.5–5.0)
Anion gap: 9 (ref 5–15)
BUN: 29 mg/dL — ABNORMAL HIGH (ref 6–20)
CHLORIDE: 97 mmol/L — AB (ref 101–111)
CO2: 26 mmol/L (ref 22–32)
CREATININE: 1.48 mg/dL — AB (ref 0.44–1.00)
Calcium: 8.5 mg/dL — ABNORMAL LOW (ref 8.9–10.3)
GFR calc non Af Amer: 38 mL/min — ABNORMAL LOW (ref >60)
GFR, EST AFRICAN AMERICAN: 44 mL/min — AB (ref >60)
Glucose, Bld: 96 mg/dL (ref 65–99)
POTASSIUM: 4.6 mmol/L (ref 3.5–5.1)
Phosphorus: 2.7 mg/dL (ref 2.5–4.6)
Sodium: 132 mmol/L — ABNORMAL LOW (ref 135–145)

## 2014-09-12 LAB — HEPARIN LEVEL (UNFRACTIONATED): Heparin Unfractionated: 0.56 IU/mL (ref 0.30–0.70)

## 2014-09-12 MED ORDER — SODIUM CHLORIDE 0.9 % WEIGHT BASED INFUSION: 3.0000 mL/kg/h | INTRAVENOUS | Status: DC

## 2014-09-12 MED ORDER — SODIUM CHLORIDE 0.9 % IV SOLN: 250.0000 mL | INTRAVENOUS | Status: DC | PRN

## 2014-09-12 MED ORDER — SODIUM CHLORIDE 0.9 % IJ SOLN: 3.0000 mL | INTRAMUSCULAR | Status: DC | PRN

## 2014-09-12 MED ORDER — FUROSEMIDE 10 MG/ML IJ SOLN
40.0000 mg | Freq: Two times a day (BID) | INTRAMUSCULAR | Status: DC
Start: 2014-09-12 — End: 2014-09-13
  Administered 2014-09-12: 40 mg via INTRAVENOUS
  Filled 2014-09-12 (×3): qty 4

## 2014-09-12 MED ORDER — ASPIRIN 81 MG PO CHEW
81.0000 mg | CHEWABLE_TABLET | ORAL | Status: AC
  Administered 2014-09-13: 81 mg via ORAL
  Filled 2014-09-12: qty 1

## 2014-09-12 MED ORDER — SODIUM CHLORIDE 0.9 % WEIGHT BASED INFUSION: 1.0000 mL/kg/h | INTRAVENOUS | Status: DC

## 2014-09-12 MED ORDER — SODIUM CHLORIDE 0.9 % IJ SOLN: 3.0000 mL | Freq: Two times a day (BID) | INTRAMUSCULAR | Status: DC

## 2014-09-12 NOTE — Progress Notes (Signed)
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 Patty Sermons Patient Profile: 59yo female w/ PMH of mitral stenosis and aortic stenosis who was admitted on 09/03/14 for left arterial thrombus and thromboembolic occlusion of left renal artery and found to be in new onset atrial fibrillation w/ RVR at that time.  SUBJECTIVE: Denies any chest pain or shortness of breath. Has not tried lying flat in her bed, for she is still sleeping in the recliner and sitting up most of the day.  OBJECTIVE Filed Vitals:   09/11/14 1606 09/11/14 1818 09/11/14 2100 09/12/14 0600  BP: 101/75 113/69 127/85 105/69  Pulse: 128 67 123 86  Temp:  98.1 F (36.7 C) 97.8 F (36.6 C) 97.6 F (36.4 C)  TempSrc:  Oral Oral Oral  Resp: Height:      Weight:    251 lb 11.2 oz (114.17 kg)  SpO2: 96% 98% 97% 99%    Intake/Output Summary (Last 24 hours) at 09/12/14 0817 Last data filed at 09/12/14 0714  Gross per 24 hour  Intake 1678.5 ml  Output   1700 ml  Net  -21.5 ml   Filed Weights   09/10/14 0700 09/11/14 0558 09/12/14 0600  Weight: 253 lb 11.2 oz (115.078 kg) 252 lb (114.306 kg) 251 lb 11.2 oz (114.17 kg)    PHYSICAL EXAM General: Well developed, well nourished, female in no acute distress. Head: Normocephalic, atraumatic.  Neck: Supple without bruits, JVD not elevated. Lungs:  Resp regular and unlabored, Rales present at bases on auscultation. Heart: Irregularly irregular, no S3, S4, SEM noted; no rub. Abdomen:  Soft, non-tender, non-distended with normoactive bowel sounds. No hepatomegaly. No rebound/guarding. No obvious abdominal masses. Extremities: No clubbing or cyanosis. 2+ pitting edema bilaterally. Distal pedal pulses are 2+ bilaterally. Neuro: Alert and oriented X 3. Moves all extremities spontaneously. Psych: Normal affect.   LABS: CBC: Recent Labs  09/10/14 1515  WBC 16.7*  HGB 12.4  HCT 37.7  MCV 88.1  PLT 301   Basic Metabolic Panel: Recent Labs  09/11/14 0543 09/12/14 0408  NA 130* 132*  K 4.4 4.6  CL 97* 97*  CO2 23 26  GLUCOSE 101* 96  BUN 29* 29*  CREATININE 1.50* 1.48*  CALCIUM 8.3* 8.5*  PHOS 2.3* 2.7   Liver Function Tests: Recent Labs  09/11/14 0543 09/12/14 0408  ALBUMIN 2.3* 2.3*    TELE:  Atrial fibrillation w/ HR mostly in the 80's. Peaked in the 120's for several minutes yesterday.      Current Medications:  . diltiazem  90 mg Oral 3 times per day  . docusate sodium  100 mg Oral BID  . furosemide  40 mg Intravenous Q12H  . magnesium chloride  1 tablet Oral BID  . metoprolol tartrate  50 mg Oral BID  . mometasone-formoterol  2 puff Inhalation BID  . pantoprazole  40 mg Oral Daily  . pneumococcal 23 valent vaccine  0.5 mL Intramuscular Tomorrow-1000  . potassium chloride  20 mEq Oral Daily  . pravastatin  40 mg Oral q1800  . senna  2 tablet Oral QHS  . sodium chloride  3 mL Intravenous Q12H   . heparin 2,250 Units/hr (09/11/14 1753)    ASSESSMENT AND PLAN: 1. 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 CT surgery recommended 4-6 weeks of warfarin for left atrial thrombus and possible amiodarone loading to maintain sinus rhythm.  - plan for L&R HC later in week once renal function improve (Cr of 1.48 on 09/11/14, improved from 2.09 three days prior) and able to lay flat.  - Nephrology on board, and feels it is safe  to proceed with LHC at this point.  However, the patient still appears volume overloaded on exam and cannot lay flat. She will likely benefit from at least one more day of diuresis before obtaining cath. - Educated on the importance of limiting her fluid intake. Also encouraged her to try lying in her bed to see if she can tolerate being in a more flat position than in the recliner.  2. New onset atrial fibrillation:  - Rate was in 110's with peaks in the 150's on 09/10/14 but with Metoprolol increase from 25mg  BID to 50mg  BID her rate has responded well and is now mostly in the 80's with one peak in the 120's.  This patients CHA2DS2-VASc Score and unadjusted Ischemic Stroke Rate (% per year) is equal to 3.2 % stroke rate/year from a score of 3 ( Female, Embolic Event) - Continue metoprolol 50mg  BID and diltiazem 90mg  TID. - Continue IV heparin. Will eventually need 4-6 weeks of warfarin for left atrial thrombus.  3. Acute diastolic HF:  - 5.8L since admission, but still volume overloaded on exam. - continue IV lasix 40mg  BID.  4. HTN: - Has been 101/63 - 127/85 in the past 24 hours.  5. 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  - CTA of abdomen 09/03/2014 large filling defect in LA suggestive for thrombus, occlusion of prox L renal artery with no significant flow to L kidney, compatible with acute arterial occlusion with L kidney infarction. - currently on heparin.  - Nephrology following. - Cr 1.2 on 8/22, peaked on 8/26 at 2.38, currently 1.48 on 09/12/14.  Otherwise, per IM.  Alexis Frock , PA-C 8:17 AM 09/12/2014 Pager: [phone] Patient is improved. Dyspnea is less. She should be able to lie relatively flat on cath table by tomorrow. She will try lying flat today as a test for tomorrow. Will schedule for right and left heart cath tomorrow (Dr. Eldridge Dace at 0730) Renal function stabilizing.  Creatinine 1.48 today which nephrology.  feels is  probably her new normal. Continue IV diuresis to maximize her ability to lie flat.The risks and benefits of a cardiac catheterization including, but not limited to, death, stroke, MI, kidney damage and bleeding were discussed with the patient who indicates understanding and agrees to proceed.

## 2014-09-12 NOTE — Progress Notes (Signed)
[facility] PROGRESS NOTE  RAYCHELLE HUDMAN ZOX:[id number] DOB: August 10, 1955 DOA: 09/03/2014 PCP: Delorse Lek, MD  Brief Summary  The patient is a 59 year old female with history of asthma/pseudo-asthma who presented to the emergency department with productive cough for 1.5 weeks.  She also had severe left-sided abdominal, flank, and back pain. She was seen 1 week prior to admission by her pulmonologist to gave her medication for bronchitis and respiratory infection.  She was found to be in a-fib with RVR.  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  She was anticoagulated with heparin.  ECHO demonstrated severe mitral valve stenosis and moderate AV stenosis.  Her acute hypoxic respiratory failure, dyspnea, left atrial thrombus, atrial fibrillation, and moderate pulmonary hypertension are all probably secondary to her severe mitral stenosis.  Her course is also complicated by AKI secondary to a complete left renal infarction which is gradually resolving.  She has been awaiting improvement in her kidney function and enough diuresis to lie flat for right and left heart cath.  Continuing to work on volume status, rate control and Nephrology is consulting regarding AKI.  Most of her pre-surgery work up has been completed during this hospitalization.  Once she is adequately diuresed and has completed her right and left heart catheterization, she will need to be transitioned to warfarin and bridged possibly with lovenox.  She will undergo valve replacement surgery in 4-6 weeks.    Assessment/Plan  Severe mitral valve stenosis/mild to moderate aortic valve stenosis.  She also has severely reduced systolic function of the right ventricle and a moderately dilated RA, PA peak pressure of 64 mmHg suggesting moderate pulmonary hypertension. -  Cardiothoracic surgery recommends  anticoagulation for at least 4-6 weeks prior to proceeding with surgery. TEE prior to outpt f/u -  PFTs completed with possible moderate restrictive lung disease -  Carotid arteries 1-39% with antegrade VA flow Cardiac cath tomorrow  A. fib with RVR, new onset and likely a complication from severe MR, chads2vasc score of 1 for female gender. On diltiazem and metoprolol with borderline BPs.  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  High risk for more thromboembolic events.  Hypercoag panel negative except for mildly low protein C level and activity and mildly low antithrombin III which were likely due to acute thrombus.  Also drawn while on heparin gtt (Cardiolipin, homocysteine, beta2-glycoprotein, lupus ac, protein S activity and total, prothrombin, and factor V leiden all negative.) -  IV heparin per pharmacy Coumadin after cath. Will need bridge, possibly lovenox  Acute hypoxic respiratory failure secondary to acute diastolic heart failure, resolving -  On RA -  Continue lasix 40mg  IV BID Still fluid overloaded on exam  Left renal infarction likely due to thromboembolic event from left atrial thrombus.  Complete loss of left kidney, anticipate new creatinine between 1.5 and 1.8.    Acute kidney injury, creatinine baseline of 0.9 and currently 1.5 -  Probably secondary to renal infarct, heart failure, and IV contrast -  No evidence of obstruction on CT  Hyponatremia, likely hypovolemic from heart failure and improving with diuresis  bilateral lower extremity edema -  Venous duplex LE neg for DVT  Hypertension, blood pressure low normal  Hyperkalemia due to AKI, resolved with IV fluids and Kayexalate  Pyuria, urine culture negative.  abx completed  Constipation, resolved -  Continue colace, senna -  Lactulose prn  Code Status: full Family Communication: patient and her family Disposition Plan:     Consultants:  Cardiology, Dr. Royann Shivers  Vascular surgery by phone, Dr. Zachery Dakins  Nephrology, Dr. Arrie Aran  Cardiothoracic surgery, Dr. Barry Dienes  Procedures:  KUB  CT angio abd/pelvis  ECHO  CXR  Planned:  [facility] once kidney function improved  Carotid duplex:  1-39% stenosis bilaterally  Vascular duplex LE:  Neg for DVT  PFTs   Antibiotics:  Ceftriaxone 8/22 > 8/26  HPI/Subjective: No new complaints  Objective: Filed Vitals:   09/12/14 1409 09/12/14 1410 09/12/14 1502 09/12/14 1506  BP: 98/63 84/62 105/77   Pulse:  92    Temp:    97.5 F (36.4 C)  TempSrc:    Oral  Resp:  15    Height:      Weight:      SpO2:  100%      Intake/Output Summary (Last 24 hours) at 09/12/14 1532 Last data filed at 09/12/14 1000  Gross per 24 hour  Intake 1918.5 ml  Output   2150 ml  Net -231.5 ml   Filed Weights   09/10/14 0700 09/11/14 0558 09/12/14 0600  Weight: 115.078 kg (253 lb 11.2 oz) 114.306 kg (252 lb) 114.17 kg (251 lb 11.2 oz)   Body mass index is 50.81 kg/(m^2).  Exam:   General:  Adult female, NAD, sitting up in recliner  Cardiovascular:  IRRR, 2/6 murmur,  Respiratory:  CTA without WRR  Abdomen:   NABS, soft, nondistended, obese, nontender  MSK:   Normal tone and bulk, 2+ LLE edema and 2+ RLE edema  Data Reviewed: Basic Metabolic Panel:  Recent Labs Lab 09/08/14 0431 09/09/14 0117 09/10/14 0241 09/11/14 0543 09/12/14 0408  NA 129* 128* 132* 130* 132*  K 4.2 3.9 4.0 4.4 4.6  CL 99* 96* 98* 97* 97*  CO2 16* 22 24 23 26   GLUCOSE 80 122* 140* 101* 96  BUN 35* 34* 31* 29* 29*  CREATININE 2.09* 2.03* 1.81* 1.50* 1.48*  CALCIUM 8.3* 8.0* 8.1* 8.3* 8.5*  PHOS 3.7 3.3 2.7 2.3* 2.7   Liver Function Tests:  Recent Labs Lab 09/08/14 0431 09/09/14 0117 09/10/14 0241 09/11/14 0543 09/12/14 0408  ALBUMIN 2.5* 2.2* 2.3* 2.3* 2.3*   No results for input(s): LIPASE, AMYLASE in the last 168 hours. No results for input(s): AMMONIA in the  last 168 hours. CBC:  Recent Labs Lab 09/06/14 0712 09/07/14 0306 09/08/14 0431 09/09/14 0117 09/10/14 1515  WBC 25.2* 23.2* 18.9* 15.2* 16.7*  HGB 12.5 12.0 12.8 12.6 12.4  HCT 37.0 35.8* 37.6 37.7 37.7  MCV 87.3 86.3 85.6 86.5 88.1  PLT 237 233 255 255 301    Recent Results (from the past 240 hour(s))  Urine culture     Status: None   Collection Time: 09/03/14  8:35 PM  Result Value Ref Range Status   Specimen Description URINE, CLEAN CATCH  Final   Special Requests NONE  Final   Culture   Final    MULTIPLE SPECIES PRESENT, SUGGEST RECOLLECTION Performed at [facility]    Report Status 09/05/2014 FINAL  Final  MRSA PCR Screening     Status: None   Collection Time: 09/03/14  9:27 PM  Result Value Ref Range Status   MRSA by PCR NEGATIVE NEGATIVE Final    Comment:        The GeneXpert MRSA Assay (FDA approved for NASAL  specimens only), is one component of a comprehensive MRSA colonization surveillance program. It is not intended to diagnose MRSA infection nor to guide or monitor treatment for MRSA infections.   Urine culture     Status: None   Collection Time: 09/05/14  9:59 AM  Result Value Ref Range Status   Specimen Description URINE, RANDOM  Final   Special Requests ADDED [id number]  Final   Culture NO GROWTH 1 DAY  Final   Report Status 09/06/2014 FINAL  Final     Studies: No results found.  Scheduled Meds: . diltiazem  90 mg Oral 3 times per day  . docusate sodium  100 mg Oral BID  . furosemide  40 mg Intravenous Q12H  . magnesium chloride  1 tablet Oral BID  . metoprolol tartrate  50 mg Oral BID  . mometasone-formoterol  2 puff Inhalation BID  . pantoprazole  40 mg Oral Daily  . pneumococcal 23 valent vaccine  0.5 mL Intramuscular Tomorrow-1000  . potassium chloride  20 mEq Oral Daily  . pravastatin  40 mg Oral q1800  . senna  2 tablet Oral QHS  . sodium chloride  3 mL Intravenous Q12H   Continuous Infusions: . 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

## 2014-09-12 NOTE — Progress Notes (Signed)
CARDIAC REHAB PHASE I   Came to ambulate with pt, pt getting ready to work with PT. Will follow-up tomorrow.   Joylene Grapes, RN, BSN 09/12/2014 7:48 AM

## 2014-09-12 NOTE — Plan of Care (Signed)
Problem: Phase II Progression Outcomes Goal: Pain controlled Outcome: Not Applicable Date Met:  24/49/75 Patient denying pain.

## 2014-09-12 NOTE — Progress Notes (Signed)
Physical Therapy Treatment Patient Details Name: [name] [name] MRN: [id number] DOB: 1955-04-15 Today's Date: 09/12/2014    History of Present Illness   [name] [name] is a 59 y.o. female with past medical history significant for asthma, hypercholesterolemia, valvular disease and obesity. She presented to the emergency department on 8/22 with complaints of a productive cough and also severe left sided abdominal flank and back pain for 3 days. She was noted to be in atrial fibrillation at the time and that was new for her. She underwent a CT of the abdomen with contrast and that was notable for an atrial thrombus and what was felt to be an acute occlusion of the left renal artery with minimal to no flow to the left kidney.   During hospitalization her HR has been difficult to control with symptoms felt to be related to severe mitral stenosis.  It is felt that she will ultimately need a mitral valve repair/replacement.       PT Comments    Patient continues to be limited by fatigue and has HR up to 129 with ambulation.  Feel will be safe for periods of being alone at home.  Daughter here during hospitalization so may be able to stay during the day at home as well.    Follow Up Recommendations  Home health PT;Supervision - Intermittent     Equipment Recommendations  Rolling walker with 5" wheels;3in1 (PT)    Recommendations for Other Services       Precautions / Restrictions Precautions Precautions: Fall    Mobility  Bed Mobility               General bed mobility comments: not assessed  Transfers   Equipment used: Rolling walker (2 wheeled)   Sit to Stand: Supervision            Ambulation/Gait Ambulation/Gait assistance: Supervision Ambulation Distance (Feet): 80 Feet (and 2 x 40' seated rest x 3 during ambulation) Assistive device: Rolling walker (2 wheeled) Gait Pattern/deviations: Step-through pattern;Decreased stride length;Trunk flexed;Shuffle      General Gait Details: reliant on UE support, needs seated rest due to fatigue during ambulation   Stairs            Wheelchair Mobility    Modified Rankin (Stroke Patients Only)       Balance Overall balance assessment: Needs assistance           Standing balance-Leahy Scale: Poor Standing balance comment: UE support required for ambulation                    Cognition Arousal/Alertness: Awake/alert Behavior During Therapy: WFL for tasks assessed/performed Overall Cognitive Status: Within Functional Limits for tasks assessed                      Exercises      General Comments General comments (skin integrity, edema, etc.): HR max 129, SpO2 99% on RA, DOE 3/4      Pertinent Vitals/Pain Pain Assessment: No/denies pain    Home Living                      Prior Function            PT Goals (current goals can now be found in the care plan section) Progress towards PT goals: Progressing toward goals    Frequency  Min 3X/week    PT Plan Discharge plan needs to be updated  Co-evaluation             End of Session Equipment Utilized During Treatment: Gait belt Activity Tolerance: Patient limited by fatigue Patient left: in chair;with family/visitor present;with call bell/phone within reach     Time: 0910-0943 PT Time Calculation (min) (ACUTE ONLY): 33 min  Charges:  $Gait Training: 8-22 mins $Therapeutic Activity: 8-22 mins                    G Codes:      Mckinley Adelstein,CYNDI 10-01-14, 10:54 AM  Sheran Lawless, PT [phone] 10/01/2014

## 2014-09-12 NOTE — Progress Notes (Signed)
Physical medicine rehabilitation continues to follow with cardiac catheterization pending. [employer] unlikely to approve inpatient rehabilitation admission at this time for current diagnosis. Will sign off for now please reconsult if needed

## 2014-09-12 NOTE — Progress Notes (Signed)
Patient ID: [name] [name], female   DOB: [birth year], 59 y.o.   MRN: [id number] S: no new events O:BP 106/60 mmHg  Pulse 98  Temp(Src) 97.6 F (36.4 C) (Oral)  Resp 14  Ht 4\' 11"  (1.499 m)  Wt 114.17 kg (251 lb 11.2 oz)  BMI 50.81 kg/m2  SpO2 99%  Intake/Output Summary (Last 24 hours) at 09/12/14 0902 Last data filed at 09/12/14 4098  Gross per 24 hour  Intake 1678.5 ml  Output   2150 ml  Net -471.5 ml   Intake/Output: I/O last 3 completed shifts: In: 2457 [P.O.:1920; I.V.:537] Out: 4000 [Urine:4000]  Intake/Output this shift:  Total I/O In: -  Out: 900 [Urine:900] Weight change: -0.136 kg (-4.8 oz) Gen:WD obese AAF in NAD CVS:IRR IRR no rub Resp:decreased BS at bases JXB:JYNWG, +BS, soft Ext:3+ edema   Recent Labs Lab 09/06/14 0712 09/07/14 0306 09/08/14 0431 09/09/14 0117 09/10/14 0241 09/11/14 0543 09/12/14 0408  NA 128* 125* 129* 128* 132* 130* 132*  K 4.9 4.6 4.2 3.9 4.0 4.4 4.6  CL 99* 96* 99* 96* 98* 97* 97*  CO2 19* 20* 16* 22 24 23 26   GLUCOSE 108* 107* 80 122* 140* 101* 96  BUN 29* 32* 35* 34* 31* 29* 29*  CREATININE 2.28* 2.38* 2.09* 2.03* 1.81* 1.50* 1.48*  ALBUMIN 2.4* 2.3* 2.5* 2.2* 2.3* 2.3* 2.3*  CALCIUM 8.5* 8.2* 8.3* 8.0* 8.1* 8.3* 8.5*  PHOS 4.0 4.4 3.7 3.3 2.7 2.3* 2.7   Liver Function Tests:  Recent Labs Lab 09/10/14 0241 09/11/14 0543 09/12/14 0408  ALBUMIN 2.3* 2.3* 2.3*   No results for input(s): LIPASE, AMYLASE in the last 168 hours. No results for input(s): AMMONIA in the last 168 hours. CBC:  Recent Labs Lab 09/06/14 0712 09/07/14 0306 09/08/14 0431 09/09/14 0117 09/10/14 1515  WBC 25.2* 23.2* 18.9* 15.2* 16.7*  HGB 12.5 12.0 12.8 12.6 12.4  HCT 37.0 35.8* 37.6 37.7 37.7  MCV 87.3 86.3 85.6 86.5 88.1  PLT 237 233 255 255 301   Cardiac Enzymes: No results for input(s): CKTOTAL, CKMB, CKMBINDEX, TROPONINI in the last 168 hours. CBG: No results for input(s): GLUCAP in the last 168 hours.  Iron Studies: No  results for input(s): IRON, TIBC, TRANSFERRIN, FERRITIN in the last 72 hours. Studies/Results: No results found. . diltiazem  90 mg Oral 3 times per day  . docusate sodium  100 mg Oral BID  . furosemide  40 mg Intravenous Q12H  . magnesium chloride  1 tablet Oral BID  . metoprolol tartrate  50 mg Oral BID  . mometasone-formoterol  2 puff Inhalation BID  . pantoprazole  40 mg Oral Daily  . pneumococcal 23 valent vaccine  0.5 mL Intramuscular Tomorrow-1000  . potassium chloride  20 mEq Oral Daily  . pravastatin  40 mg Oral q1800  . senna  2 tablet Oral QHS  . sodium chloride  3 mL Intravenous Q12H    BMET    Component Value Date/Time   NA 132* 09/12/2014 0408   K 4.6 09/12/2014 0408   CL 97* 09/12/2014 0408   CO2 26 09/12/2014 0408   GLUCOSE 96 09/12/2014 0408   BUN 29* 09/12/2014 0408   CREATININE 1.48* 09/12/2014 0408   CALCIUM 8.5* 09/12/2014 0408   GFRNONAA 38* 09/12/2014 0408   GFRAA 44* 09/12/2014 0408   CBC    Component Value Date/Time   WBC 16.7* 09/10/2014 1515   RBC 4.28 09/10/2014 1515   HGB 12.4 09/10/2014 1515   HCT  37.7 09/10/2014 1515   PLT 301 09/10/2014 1515   MCV 88.1 09/10/2014 1515   MCH 29.0 09/10/2014 1515   MCHC 32.9 09/10/2014 1515   RDW 15.3 09/10/2014 1515   LYMPHSABS 1.0 09/03/2014 1600   MONOABS 0.6 09/03/2014 1600   EOSABS 0.0 09/03/2014 1600   BASOSABS 0.0 09/03/2014 1600     Assessment/Plan:  1. AKI in setting of acute left renal infarct as well as hypotension and IV contrast. Non-oliguric.  1. Has reached nadir, likely new baseline is 1.5 2. I think at this point safe to proceed with LHC  From [facility] 3. Hold diuretics pre LHC 2. 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 on heparin TCTS and cardiology following 3. HTN- stable 4. Hyponatremia- stable, mild 5. Mitral and aortic valve stenosis due to rheumatic heart disease- cardiology  following  [name]

## 2014-09-12 NOTE — Progress Notes (Addendum)
Patient's BP in right arm= 98/63 and BP in left arm =84/62.  Spoke to Dr. Lendell Caprice she stated to hold 1400 dose of po cardizem.     Rechecked BP at 1451 = 105/77, Spoke to Dr. Lendell Caprice she stated OK to give 1400 dose of cardizem

## 2014-09-12 NOTE — Progress Notes (Signed)
ANTICOAGULATION CONSULT NOTE - Follow Up Consult  Pharmacy Consult for heparin Indication: atrial clot and infaracted kidney.   Allergies  Allergen Reactions  . Codeine Itching    Patient Measurements: Height:  (149.9 cm) Weight: 251 lb 11.2 oz (114.17 kg) IBW/kg (Calculated) : 43.2 Heparin Dosing Weight: 72 kg  Vital Signs: Temp: 97.6 F (36.4 C) (08/31 0600) Temp Source: Oral (08/31 0600) BP: 105/69 mmHg (08/31 0600) Pulse Rate: 86 (08/31 0600)  Labs:  Recent Labs  09/10/14 0241 09/10/14 1515 09/11/14 0543 09/12/14 0408  HGB  --  12.4  --   --   HCT  --  37.7  --   --   PLT  --  301  --   --   HEPARINUNFRC 0.57  --  0.62 0.56  CREATININE 1.81*  --  1.50* 1.48*    Estimated Creatinine Clearance: 46.8 mL/min (by C-G formula based on Cr of 1.48).  Assessment:  59yo female on heparin for atrial clot and infarcted kidney. HL therapeutic (HL= 0.56) on 2250 units/h. Following daily HLs. CBC wnl.  Doppler (-)VTE. Plans noted for cath this week the TEE in 4-6 weeks to evaluate LA thrombus and severity of AS.   Goal of Therapy:  Heparin level 0.3-0.7 units/ml Monitor platelets by anticoagulation protocol: Yes   Plan:  - Cont Heparin 2250 units/hr - Daily HL and CBC - Planning coumadin post cath  Harland German, Pharm D 09/12/2014 8:31 AM

## 2014-09-12 NOTE — Progress Notes (Signed)
Occupational Therapy Treatment Patient Details Name: AMORIA MCLEES MRN: [id number] DOB: 22-Nov-1955 Today's Date: 09/12/2014    History of present illness   [name] [name] is a 59 y.o. female with past medical history significant for asthma, hypercholesterolemia, valvular disease and obesity. She presented to the emergency department on 8/22 with complaints of a productive cough and also severe left sided abdominal flank and back pain for 3 days. She was noted to be in atrial fibrillation at the time and that was new for her. She underwent a CT of the abdomen with contrast and that was notable for an atrial thrombus and what was felt to be an acute occlusion of the left renal artery with minimal to no flow to the left kidney.   During hospitalization her HR has been difficult to control with symptoms felt to be related to severe mitral stenosis.  It is felt that she will ultimately need a mitral valve repair/replacement.      OT comments  Patient making progress, continue plan of care for now. Plan is for procedure tomorrow (right and left heart cath). Educated patient on BUE HEP using level 2 theraband. Pt motivated to gain back her independence and eventually go back to work.  Lots of education regarding listening to her body and energy conservation.   Pt's HR increased -> 124 while performing seated BUE strengthening exercise. Nursing checked BP=98/63 (RUE) & 84/52 (LUE) in seated positions.    Follow Up Recommendations  CIR;Supervision/Assistance - 24 hour    Equipment Recommendations  3 in 1 bedside comode;Tub/shower bench (AE)    Recommendations for Other Services Rehab consult    Precautions / Restrictions Precautions Precautions: Fall Precaution Comments: watch HR Restrictions Weight Bearing Restrictions: No       Mobility Bed Mobility  General bed mobility comments: not assessed, pt in recliner upon OT entering/exiting room  Transfers General transfer comment: not  assessed    Balance Overall balance assessment: Needs assistance Sitting-balance support: No upper extremity supported;Feet supported Sitting balance-Leahy Scale: Good       Standing balance-Leahy Scale: Poor Standing balance comment: UE support required for ambulation   ADL Overall ADL's : Needs assistance/impaired  General ADL Comments: Upon entering room, pt stated her daughter assisted with transfer <> BSC. Therapist encouraged pt to call nursing staff and pt ambulate <> BR using RW. Educated pt on HEP for BUE using orange theraband. Also educated pt importance of energy conservation during dat. May give energy conservation handout found under education.    \   Cognition   Behavior During Therapy: WFL for tasks assessed/performed Overall Cognitive Status: Within Functional Limits for tasks assessed     Exercises General Exercises - Upper Extremity Shoulder Flexion: Strengthening;10 reps;Both;Theraband (one arm at a time) Theraband Level (Shoulder Flexion): Level 2 (Red) Shoulder ABduction: Strengthening;Both;10 reps;Theraband (both arms) Theraband Level (Shoulder Abduction): Level 2 (Red) Shoulder Horizontal ABduction: Strengthening;10 reps;Theraband (one arm at a time) Theraband Level (Shoulder Horizontal Abduction): Level 2 (Red)           Pertinent Vitals/ Pain       Pain Assessment: No/denies pain   Frequency Min 2X/week     Progress Toward Goals  OT Goals(current goals can now befound in the care plan section)  Progress towards OT goals: Progressing toward goals     Plan Discharge plan remains appropriate    End of Session Equipment Utilized During Treatment: Other (comment) (theraband)   Activity Tolerance Patient limited by fatigue  Patient Left in chair;with call bell/phone within reach;with family/visitor present    Time: 1610-9604 OT Time Calculation (min): 31 min  Charges: OT General Charges $OT Visit: 1 Procedure OT Treatments $Therapeutic  Exercise: 8-22 mins  Onix Jumper , MS, OTR/L, CLT Pager: [phone]  09/12/2014, 2:29 PM

## 2014-09-13 ENCOUNTER — Encounter (HOSPITAL_COMMUNITY)
Admission: EM | Disposition: A | Discharge status: Home Health | Payer: Self-pay | Source: Home / Self Care | Attending: Internal Medicine

## 2014-09-13 DIAGNOSIS — I270 Primary pulmonary hypertension: Secondary | ICD-10-CM

## 2014-09-13 HISTORY — PX: CARDIAC CATHETERIZATION: SHX172

## 2014-09-13 LAB — RENAL FUNCTION PANEL
Albumin: 2.3 g/dL — ABNORMAL LOW (ref 3.5–5.0)
Anion gap: 8 (ref 5–15)
BUN: 30 mg/dL — AB (ref 6–20)
CHLORIDE: 98 mmol/L — AB (ref 101–111)
CO2: 27 mmol/L (ref 22–32)
Calcium: 8.5 mg/dL — ABNORMAL LOW (ref 8.9–10.3)
Creatinine, Ser: 1.55 mg/dL — ABNORMAL HIGH (ref 0.44–1.00)
GFR calc Af Amer: 42 mL/min — ABNORMAL LOW (ref >60)
GFR, EST NON AFRICAN AMERICAN: 36 mL/min — AB (ref >60)
Glucose, Bld: 100 mg/dL — ABNORMAL HIGH (ref 65–99)
POTASSIUM: 4.6 mmol/L (ref 3.5–5.1)
Phosphorus: 3 mg/dL (ref 2.5–4.6)
Sodium: 133 mmol/L — ABNORMAL LOW (ref 135–145)

## 2014-09-13 LAB — POCT I-STAT 3, ART BLOOD GAS (G3+)
ACID-BASE EXCESS: 4 mmol/L — AB (ref 0.0–2.0)
BICARBONATE: 27.4 meq/L — AB (ref 20.0–24.0)
O2 SAT: 90 %
PCO2 ART: 36 mmHg (ref 35.0–45.0)
PO2 ART: 53 mmHg — AB (ref 80.0–100.0)
TCO2: 28 mmol/L (ref 0–100)
pH, Arterial: 7.489 — ABNORMAL HIGH (ref 7.350–7.450)

## 2014-09-13 LAB — POCT I-STAT 3, VENOUS BLOOD GAS (G3P V)
ACID-BASE EXCESS: 4 mmol/L — AB (ref 0.0–2.0)
Bicarbonate: 29.1 mEq/L — ABNORMAL HIGH (ref 20.0–24.0)
O2 SAT: 42 %
PCO2 VEN: 44.6 mmHg — AB (ref 45.0–50.0)
PO2 VEN: 24 mmHg — AB (ref 30.0–45.0)
TCO2: 30 mmol/L (ref 0–100)
pH, Ven: 7.424 — ABNORMAL HIGH (ref 7.250–7.300)

## 2014-09-13 LAB — PROTIME-INR
INR: 1.32 (ref 0.00–1.49)
PROTHROMBIN TIME: 16.5 s — AB (ref 11.6–15.2)

## 2014-09-13 LAB — HEPARIN LEVEL (UNFRACTIONATED)
HEPARIN UNFRACTIONATED: 1.36 [IU]/mL — AB (ref 0.30–0.70)
HEPARIN UNFRACTIONATED: 0.64 [IU]/mL (ref 0.30–0.70)

## 2014-09-13 SURGERY — RIGHT/LEFT HEART CATH AND CORONARY ANGIOGRAPHY

## 2014-09-13 MED ORDER — FENTANYL CITRATE (PF) 100 MCG/2ML IJ SOLN
INTRAMUSCULAR | Status: DC | PRN
  Administered 2014-09-13: 25 ug via INTRAVENOUS

## 2014-09-13 MED ORDER — VERAPAMIL HCL 2.5 MG/ML IV SOLN
INTRAVENOUS | Status: DC | PRN
  Administered 2014-09-13: 10:00:00 via INTRA_ARTERIAL

## 2014-09-13 MED ORDER — ONDANSETRON HCL 4 MG/2ML IJ SOLN: 4.0000 mg | Freq: Four times a day (QID) | INTRAMUSCULAR | Status: DC | PRN

## 2014-09-13 MED ORDER — NITROGLYCERIN 1 MG/10 ML FOR IR/CATH LAB
INTRA_ARTERIAL | Status: AC
  Filled 2014-09-13: qty 10

## 2014-09-13 MED ORDER — COUMADIN BOOK
Freq: Once | Status: AC
  Administered 2014-09-13: 13:00:00
  Filled 2014-09-13: qty 1

## 2014-09-13 MED ORDER — SODIUM CHLORIDE 0.9 % IJ SOLN
3.0000 mL | Freq: Two times a day (BID) | INTRAMUSCULAR | Status: DC
  Administered 2014-09-15 – 2014-09-16 (×3): 3 mL via INTRAVENOUS

## 2014-09-13 MED ORDER — SODIUM CHLORIDE 0.9 % IV SOLN: 250.0000 mL | INTRAVENOUS | Status: DC | PRN

## 2014-09-13 MED ORDER — HEPARIN (PORCINE) IN NACL 100-0.45 UNIT/ML-% IJ SOLN: 2050.0000 [IU]/h | INTRAMUSCULAR | Status: DC

## 2014-09-13 MED ORDER — FENTANYL CITRATE (PF) 100 MCG/2ML IJ SOLN
INTRAMUSCULAR | Status: AC
  Filled 2014-09-13: qty 4

## 2014-09-13 MED ORDER — SODIUM CHLORIDE 0.9 % IV SOLN
INTRAVENOUS | Status: AC
  Administered 2014-09-13: 13:00:00 via INTRAVENOUS

## 2014-09-13 MED ORDER — SODIUM CHLORIDE 0.9 % IJ SOLN: 3.0000 mL | INTRAMUSCULAR | Status: DC | PRN

## 2014-09-13 MED ORDER — ACETAMINOPHEN 325 MG PO TABS
650.0000 mg | ORAL_TABLET | Freq: Once | ORAL | Status: AC
  Administered 2014-09-13: 650 mg via ORAL
  Filled 2014-09-13: qty 2

## 2014-09-13 MED ORDER — ACETAMINOPHEN 325 MG PO TABS
650.0000 mg | ORAL_TABLET | ORAL | Status: DC | PRN
  Administered 2014-09-16 – 2014-09-17 (×2): 650 mg via ORAL
  Filled 2014-09-13 (×2): qty 2

## 2014-09-13 MED ORDER — HEPARIN SODIUM (PORCINE) 1000 UNIT/ML IJ SOLN
INTRAMUSCULAR | Status: AC
  Filled 2014-09-13: qty 1

## 2014-09-13 MED ORDER — LIDOCAINE HCL (PF) 1 % IJ SOLN
INTRAMUSCULAR | Status: DC | PRN
  Administered 2014-09-13: 10:00:00

## 2014-09-13 MED ORDER — HEPARIN (PORCINE) IN NACL 100-0.45 UNIT/ML-% IJ SOLN
2050.0000 [IU]/h | INTRAMUSCULAR | Status: DC
  Administered 2014-09-13 – 2014-09-14 (×2): 2050 [IU]/h via INTRAVENOUS

## 2014-09-13 MED ORDER — WARFARIN SODIUM 7.5 MG PO TABS
7.5000 mg | ORAL_TABLET | Freq: Once | ORAL | Status: AC
  Administered 2014-09-13: 7.5 mg via ORAL
  Filled 2014-09-13: qty 1

## 2014-09-13 MED ORDER — HEPARIN (PORCINE) IN NACL 2-0.9 UNIT/ML-% IJ SOLN
INTRAMUSCULAR | Status: AC
  Filled 2014-09-13: qty 1000

## 2014-09-13 MED ORDER — IOHEXOL 350 MG/ML SOLN
INTRAVENOUS | Status: DC | PRN
  Administered 2014-09-13: 30 mL via INTRA_ARTERIAL

## 2014-09-13 MED ORDER — VERAPAMIL HCL 2.5 MG/ML IV SOLN
INTRAVENOUS | Status: AC
  Filled 2014-09-13: qty 2

## 2014-09-13 MED ORDER — FUROSEMIDE 10 MG/ML IJ SOLN
40.0000 mg | Freq: Two times a day (BID) | INTRAMUSCULAR | Status: DC
  Administered 2014-09-14: 40 mg via INTRAVENOUS
  Filled 2014-09-13: qty 4

## 2014-09-13 MED ORDER — HEPARIN SODIUM (PORCINE) 1000 UNIT/ML IJ SOLN
INTRAMUSCULAR | Status: DC | PRN
  Administered 2014-09-13: 6000 [IU] via INTRAVENOUS

## 2014-09-13 MED ORDER — LIDOCAINE HCL (PF) 1 % IJ SOLN
INTRAMUSCULAR | Status: AC
  Filled 2014-09-13: qty 30

## 2014-09-13 MED ORDER — SODIUM CHLORIDE 0.9 % IV SOLN
INTRAVENOUS | Status: DC | PRN
  Administered 2014-09-13: 10 mL/h via INTRAVENOUS

## 2014-09-13 MED ORDER — MIDAZOLAM HCL 2 MG/2ML IJ SOLN
INTRAMUSCULAR | Status: AC
  Filled 2014-09-13: qty 4

## 2014-09-13 MED ORDER — WARFARIN VIDEO
Freq: Once | Status: DC
Start: 2014-09-13 — End: 2014-09-18

## 2014-09-13 MED ORDER — WARFARIN - PHARMACIST DOSING INPATIENT: Freq: Every day | Status: DC

## 2014-09-13 MED ORDER — MIDAZOLAM HCL 2 MG/2ML IJ SOLN
INTRAMUSCULAR | Status: DC | PRN
  Administered 2014-09-13: 1 mg via INTRAVENOUS

## 2014-09-13 SURGICAL SUPPLY — 17 items
CATH INFINITI 5 FR JL3.5 (CATHETERS) ×3 IMPLANT
CATH INFINITI 5FR ANG PIGTAIL (CATHETERS) ×3 IMPLANT
CATH INFINITI JR4 5F (CATHETERS) ×3 IMPLANT
CATH SWAN GANZ 7F STRAIGHT (CATHETERS) ×2 IMPLANT
DEVICE RAD COMP TR BAND LRG (VASCULAR PRODUCTS) ×3 IMPLANT
GLIDESHEATH SLEND SS 6F .021 (SHEATH) ×3 IMPLANT
HOVERMATT SINGLE USE (MISCELLANEOUS) ×2 IMPLANT
KIT HEART LEFT (KITS) ×3 IMPLANT
KIT HEART RIGHT NAMIC (KITS) ×3 IMPLANT
PACK CARDIAC CATHETERIZATION (CUSTOM PROCEDURE TRAY) ×3 IMPLANT
SHEATH PINNACLE 7F 10CM (SHEATH) ×2 IMPLANT
SYR MEDRAD MARK V 150ML (SYRINGE) ×3 IMPLANT
TRANSDUCER W/STOPCOCK (MISCELLANEOUS) ×6 IMPLANT
TUBING CIL FLEX 10 FLL-RA (TUBING) ×3 IMPLANT
WIRE EMERALD 3MM-J .025X260CM (WIRE) ×2 IMPLANT
WIRE HI TORQ VERSACORE-J 145CM (WIRE) ×2 IMPLANT
WIRE SAFE-T 1.5MM-J .035X260CM (WIRE) ×3 IMPLANT

## 2014-09-13 NOTE — Interval H&P Note (Signed)
Cath Lab Visit (complete for each Cath Lab visit)  Clinical Evaluation Leading to the Procedure:   ACS: No.  Non-ACS:    Anginal Classification: CCS III  Anti-ischemic medical therapy: Minimal Therapy (1 class of medications)  Non-Invasive Test Results: No non-invasive testing performed  Prior CABG: No previous CABG   Diagnostic cath only in anticipation of MV repair.   History and Physical Interval Note:  09/13/2014 9:20 AM  [name] [name]  has presented today for surgery, with the diagnosis of stenosis  The various methods of treatment have been discussed with the patient and family. After consideration of risks, benefits and other options for treatment, the patient has consented to  Procedure(s): Right/Left Heart Cath and Coronary Angiography (N/A) as a surgical intervention .  The patient's history has been reviewed, patient examined, no change in status, stable for surgery.  I have reviewed the patient's chart and labs.  Questions were answered to the patient's satisfaction.     [name] Rupert S.

## 2014-09-13 NOTE — Progress Notes (Signed)
Spoke with cardiologist on call to hold the pre cath fluids this am. Will cont to monitor pt.

## 2014-09-13 NOTE — Progress Notes (Signed)
Patient ID: [name] [name], female   DOB: 10-May-1955, 59 y.o.   MRN: [id number] S: no new events O:BP 86/67 mmHg  Pulse 68  Temp(Src) 97.5 F (36.4 C) (Oral)  Resp 21  Ht 4\' 11"  (1.499 m)  Wt 114.17 kg (251 lb 11.2 oz)  BMI 50.81 kg/m2  SpO2 95%  Intake/Output Summary (Last 24 hours) at 09/13/14 1049 Last data filed at 09/13/14 0647  Gross per 24 hour  Intake      0 ml  Output   1250 ml  Net  -1250 ml   Intake/Output: I/O last 3 completed shifts: In: 1438.5 [P.O.:1170; I.V.:268.5] Out: 3400 [Urine:3400]  Intake/Output this shift:    Weight change:  Gen:WD obese AAF in NAD CVS:IRR IRR no rub Resp:decreased BS at bases WUJ:WJXBJ, +BS, soft Ext:3+ edema   Recent Labs Lab 09/07/14 0306 09/08/14 0431 09/09/14 0117 09/10/14 0241 09/11/14 0543 09/12/14 0408 09/13/14 0554  NA 125* 129* 128* 132* 130* 132* 133*  K 4.6 4.2 3.9 4.0 4.4 4.6 4.6  CL 96* 99* 96* 98* 97* 97* 98*  CO2 20* 16* 22 24 23 26 27   GLUCOSE 107* 80 122* 140* 101* 96 100*  BUN 32* 35* 34* 31* 29* 29* 30*  CREATININE 2.38* 2.09* 2.03* 1.81* 1.50* 1.48* 1.55*  ALBUMIN 2.3* 2.5* 2.2* 2.3* 2.3* 2.3* 2.3*  CALCIUM 8.2* 8.3* 8.0* 8.1* 8.3* 8.5* 8.5*  PHOS 4.4 3.7 3.3 2.7 2.3* 2.7 3.0   Liver Function Tests:  Recent Labs Lab 09/11/14 0543 09/12/14 0408 09/13/14 0554  ALBUMIN 2.3* 2.3* 2.3*   No results for input(s): LIPASE, AMYLASE in the last 168 hours. No results for input(s): AMMONIA in the last 168 hours. CBC:  Recent Labs Lab 09/07/14 0306 09/08/14 0431 09/09/14 0117 09/10/14 1515 09/12/14 1557  WBC 23.2* 18.9* 15.2* 16.7* 18.1*  HGB 12.0 12.8 12.6 12.4 11.7*  HCT 35.8* 37.6 37.7 37.7 35.9*  MCV 86.3 85.6 86.5 88.1 87.1  PLT 233 255 255 301 316   Cardiac Enzymes: No results for input(s): CKTOTAL, CKMB, CKMBINDEX, TROPONINI in the last 168 hours. CBG: No results for input(s): GLUCAP in the last 168 hours.  Iron Studies: No results for input(s): IRON, TIBC, TRANSFERRIN,  FERRITIN in the last 72 hours. Studies/Results: No results found. [name] [name] Hold] diltiazem  90 mg Oral 3 times per day  . [MAR Hold] docusate sodium  100 mg Oral BID  . [MAR Hold] furosemide  40 mg Intravenous Q12H  . [MAR Hold] magnesium chloride  1 tablet Oral BID  . [MAR Hold] metoprolol tartrate  50 mg Oral BID  . [MAR Hold] mometasone-formoterol  2 puff Inhalation BID  . [MAR Hold] pantoprazole  40 mg Oral Daily  . [MAR Hold] pneumococcal 23 valent vaccine  0.5 mL Intramuscular Tomorrow-1000  . [MAR Hold] potassium chloride  20 mEq Oral Daily  . [MAR Hold] pravastatin  40 mg Oral q1800  . [MAR Hold] senna  2 tablet Oral QHS  . [MAR Hold] sodium chloride  3 mL Intravenous Q12H  . sodium chloride  3 mL Intravenous Q12H    BMET    Component Value Date/Time   NA 133* 09/13/2014 0554   K 4.6 09/13/2014 0554   CL 98* 09/13/2014 0554   CO2 27 09/13/2014 0554   GLUCOSE 100* 09/13/2014 0554   BUN 30* 09/13/2014 0554   CREATININE 1.55* 09/13/2014 0554   CALCIUM 8.5* 09/13/2014 0554   GFRNONAA 36* 09/13/2014 0554   GFRAA 42* 09/13/2014 0554  CBC    Component Value Date/Time   WBC 18.1* 09/12/2014 1557   RBC 4.12 09/12/2014 1557   HGB 11.7* 09/12/2014 1557   HCT 35.9* 09/12/2014 1557   PLT 316 09/12/2014 1557   MCV 87.1 09/12/2014 1557   MCH 28.4 09/12/2014 1557   MCHC 32.6 09/12/2014 1557   RDW 15.2 09/12/2014 1557   LYMPHSABS 1.0 09/03/2014 1600   MONOABS 0.6 09/03/2014 1600   EOSABS 0.0 09/03/2014 1600   BASOSABS 0.0 09/03/2014 1600     Assessment/Plan:  1. AKI in setting of acute left renal infarct as well as hypotension and IV contrast. Non-oliguric.  1. Has reached nadir, likely new baseline is ~1.5 2. I think at this point safe to proceed with LHC  From [facility] 3. Hold diuretics pre LHC and for 24h post LHC 4. Will follow post LHC 2. 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 on heparin TCTS and cardiology following 3. HTN- stable 4. Hyponatremia- stable, mild 5. 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

## 2014-09-13 NOTE — Progress Notes (Signed)
Site area: right groin a 7 french venous sheath was removed  Site Prior to Removal:  Level 0  Pressure Applied For 15 MINUTES    Minutes Beginning at 1045a  Manual:   Yes.    Patient Status During Pull:  stable  Post Pull Groin Site:  Level 0  Post Pull Instructions Given:  Yes.    Post Pull Pulses Present:  Yes.    Dressing Applied:  Yes.    Comments:  VS remain stable during sheath pull.

## 2014-09-13 NOTE — H&P (View-Only) (Signed)
Patient Name: [name] [name] Date of Encounter: 09/13/2014  Principal Problem:   Atrial fibrillation with rapid ventricular response Active Problems:   Renal infarction   Hyperkalemia   Left atrial thrombus   Acute renal artery occlusion   Mitral valve stenosis and aortic valve stenosis   Rheumatic mitral stenosis   Rheumatic aortic stenosis   Chronic diastolic heart failure   Acute cystitis without hematuria   Lower extremity edema   Renal insufficiency   Mitral valve stenosis     Primary Cardiologist: Dr. Patty Sermons Patient Profile: 59yo female w/ PMH of mitral stenosis and aortic stenosis who was admitted on 09/03/14 for left arterial thrombus and thromboembolic occlusion of left renal artery and found to be in new onset atrial fibrillation w/ RVR at that time.  SUBJECTIVE: Denies any chest pain or shortness of breath. Has not tried lying flat in her bed, for she is still sleeping in the recliner and sitting up most of the day.  OBJECTIVE Filed Vitals:   09/12/14 1812 09/12/14 2035 09/13/14 0437 09/13/14 0600  BP: 94/67 103/64 97/58 95/61   Pulse:  90 78   Temp:  98.3 F (36.8 C) 97.5 F (36.4 C)   TempSrc:  Oral Oral   Resp:  22 22   Height:      Weight:      SpO2:  98% 96%     Intake/Output Summary (Last 24 hours) at 09/13/14 0749 Last data filed at 09/13/14 0647  Gross per 24 hour  Intake    660 ml  Output   1700 ml  Net  -1040 ml   Filed Weights   09/10/14 0700 09/11/14 0558 09/12/14 0600  Weight: 253 lb 11.2 oz (115.078 kg) 252 lb (114.306 kg) 251 lb 11.2 oz (114.17 kg)    PHYSICAL EXAM General: Well developed, morbidly obese, female in no acute distress. Head: Normocephalic, atraumatic.  Neck: Supple without bruits, JVD not elevated. Lungs:  Resp regular and unlabored, few rales present at bases on auscultation. Heart: Irregularly irregular, no S3, S4, SEM noted; no rub. Extremities: No clubbing or cyanosis. 2+ pitting edema bilaterally.  Distal pedal pulses are 2+ bilaterally. Neuro: Alert and oriented X 3. Moves all extremities spontaneously. Psych: Normal affect.   LABS: CBC:  Recent Labs  09/10/14 1515 09/12/14 1557  WBC 16.7* 18.1*  HGB 12.4 11.7*  HCT 37.7 35.9*  MCV 88.1 87.1  PLT 301 316   Basic Metabolic Panel:  Recent Labs  16/10/96 0408 09/13/14 0554  NA 132* 133*  K 4.6 4.6  CL 97* 98*  CO2 26 27  GLUCOSE 96 100*  BUN 29* 30*  CREATININE 1.48* 1.55*  CALCIUM 8.5* 8.5*  PHOS 2.7 3.0   Liver Function Tests:  Recent Labs  09/12/14 0408 09/13/14 0554  ALBUMIN 2.3* 2.3*    TELE:  Atrial fibrillation w/ HR mostly in the 80's. Peaked in the 120's for several minutes yesterday.      Current Medications:  . diltiazem  90 mg Oral 3 times per day  . docusate sodium  100 mg Oral BID  . furosemide  40 mg Intravenous Q12H  . magnesium chloride  1 tablet Oral BID  . metoprolol tartrate  50 mg Oral BID  . mometasone-formoterol  2 puff Inhalation BID  . pantoprazole  40 mg Oral Daily  . pneumococcal 23 valent vaccine  0.5 mL Intramuscular Tomorrow-1000  . potassium chloride  20 mEq Oral Daily  . pravastatin  40  mg Oral q1800  . senna  2 tablet Oral QHS  . sodium chloride  3 mL Intravenous Q12H  . sodium chloride  3 mL Intravenous Q12H   . sodium chloride    . heparin 2,250 Units/hr (09/12/14 1931)    ASSESSMENT AND PLAN: 1. 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  - plan for L&R [facility] today  2. New onset atrial fibrillation:  - Rate was in 110's with peaks in the 150's on 09/10/14 but with Metoprolol increase from 25mg  BID to 50mg  BID her rate has responded well and is now mostly in the 80's with one peak in the 120's.  This patients CHA2DS2-VASc Score = 3 (  Female, Embolic Event) - Continue metoprolol 50mg  BID and diltiazem 90mg  TID. - Continue IV heparin. Will eventually need 4-6 weeks of warfarin for left atrial thrombus.  3. Acute diastolic HF:  - 6.8L since admission, lasix held today  4. HTN: - borderline low now with systolic 90-110  5. 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  - CTA of abdomen 09/03/2014 large filling defect in LA suggestive for thrombus, occlusion of prox L renal artery with no significant flow to L kidney, compatible with acute arterial occlusion with L kidney infarction. - currently on heparin.  - Nephrology following. - Cr 1.2 on 8/22, peaked on 8/26 at 2.38, appears to have settled at 1.55  Plan: Lasix held, proceed with cath today.  Deland Pretty , PA-C 7:49 AM 09/13/2014 Pager: [phone] The patient still has some orthopnea but was able to lie flat for several hours during the night without difficulty. Kidney function is stable. Still has fluid overload with peripheral edema. Will proceed with cath today.

## 2014-09-13 NOTE — Progress Notes (Signed)
Patient Name: [name] [name] Date of Encounter: 09/13/2014  Principal Problem:   Atrial fibrillation with rapid ventricular response Active Problems:   Renal infarction   Hyperkalemia   Left atrial thrombus   Acute renal artery occlusion   Mitral valve stenosis and aortic valve stenosis   Rheumatic mitral stenosis   Rheumatic aortic stenosis   Chronic diastolic heart failure   Acute cystitis without hematuria   Lower extremity edema   Renal insufficiency   Mitral valve stenosis     Primary Cardiologist: Dr. Patty Sermons Patient Profile: 59yo female w/ PMH of mitral stenosis and aortic stenosis who was admitted on 09/03/14 for left arterial thrombus and thromboembolic occlusion of left renal artery and found to be in new onset atrial fibrillation w/ RVR at that time.  SUBJECTIVE: Denies any chest pain or shortness of breath. Has not tried lying flat in her bed, for she is still sleeping in the recliner and sitting up most of the day.  OBJECTIVE Filed Vitals:   09/12/14 1812 09/12/14 2035 09/13/14 0437 09/13/14 0600  BP: 94/67 103/64 97/58 95/61   Pulse:  90 78   Temp:  98.3 F (36.8 C) 97.5 F (36.4 C)   TempSrc:  Oral Oral   Resp:  22 22   Height:      Weight:      SpO2:  98% 96%     Intake/Output Summary (Last 24 hours) at 09/13/14 0749 Last data filed at 09/13/14 0647  Gross per 24 hour  Intake    660 ml  Output   1700 ml  Net  -1040 ml   Filed Weights   09/10/14 0700 09/11/14 0558 09/12/14 0600  Weight: 253 lb 11.2 oz (115.078 kg) 252 lb (114.306 kg) 251 lb 11.2 oz (114.17 kg)    PHYSICAL EXAM General: Well developed, morbidly obese, female in no acute distress. Head: Normocephalic, atraumatic.  Neck: Supple without bruits, JVD not elevated. Lungs:  Resp regular and unlabored, few rales present at bases on auscultation. Heart: Irregularly irregular, no S3, S4, SEM noted; no rub. Extremities: No clubbing or cyanosis. 2+ pitting edema bilaterally.  Distal pedal pulses are 2+ bilaterally. Neuro: Alert and oriented X 3. Moves all extremities spontaneously. Psych: Normal affect.   LABS: CBC:  Recent Labs  09/10/14 1515 09/12/14 1557  WBC 16.7* 18.1*  HGB 12.4 11.7*  HCT 37.7 35.9*  MCV 88.1 87.1  PLT 301 316   Basic Metabolic Panel:  Recent Labs  16/10/96 0408 09/13/14 0554  NA 132* 133*  K 4.6 4.6  CL 97* 98*  CO2 26 27  GLUCOSE 96 100*  BUN 29* 30*  CREATININE 1.48* 1.55*  CALCIUM 8.5* 8.5*  PHOS 2.7 3.0   Liver Function Tests:  Recent Labs  09/12/14 0408 09/13/14 0554  ALBUMIN 2.3* 2.3*    TELE:  Atrial fibrillation w/ HR mostly in the 80's. Peaked in the 120's for several minutes yesterday.      Current Medications:  . diltiazem  90 mg Oral 3 times per day  . docusate sodium  100 mg Oral BID  . furosemide  40 mg Intravenous Q12H  . magnesium chloride  1 tablet Oral BID  . metoprolol tartrate  50 mg Oral BID  . mometasone-formoterol  2 puff Inhalation BID  . pantoprazole  40 mg Oral Daily  . pneumococcal 23 valent vaccine  0.5 mL Intramuscular Tomorrow-1000  . potassium chloride  20 mEq Oral Daily  . pravastatin  40  mg Oral q1800  . senna  2 tablet Oral QHS  . sodium chloride  3 mL Intravenous Q12H  . sodium chloride  3 mL Intravenous Q12H   . sodium chloride    . heparin 2,250 Units/hr (09/12/14 1931)    ASSESSMENT AND PLAN: 1. 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  - plan for L&R [facility] today  2. New onset atrial fibrillation:  - Rate was in 110's with peaks in the 150's on 09/10/14 but with Metoprolol increase from 25mg  BID to 50mg  BID her rate has responded well and is now mostly in the 80's with one peak in the 120's.  This patients CHA2DS2-VASc Score = 3 (  Female, Embolic Event) - Continue metoprolol 50mg  BID and diltiazem 90mg  TID. - Continue IV heparin. Will eventually need 4-6 weeks of warfarin for left atrial thrombus.  3. Acute diastolic HF:  - 6.8L since admission, lasix held today  4. HTN: - borderline low now with systolic 90-110  5. 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  - CTA of abdomen 09/03/2014 large filling defect in LA suggestive for thrombus, occlusion of prox L renal artery with no significant flow to L kidney, compatible with acute arterial occlusion with L kidney infarction. - currently on heparin.  - Nephrology following. - Cr 1.2 on 8/22, peaked on 8/26 at 2.38, appears to have settled at 1.55  Plan: Lasix held, proceed with cath today.  Deland Pretty , PA-C 7:49 AM 09/13/2014 Pager: [phone] The patient still has some orthopnea but was able to lie flat for several hours during the night without difficulty. Kidney function is stable. Still has fluid overload with peripheral edema. Will proceed with cath today.

## 2014-09-13 NOTE — Progress Notes (Signed)
[facility] PROGRESS NOTE  [name] [name] ZOX:[id number] DOB: 1955/04/30 DOA: 09/03/2014 PCP: Delorse Lek, MD  Brief Summary  The patient is a 59 year old female with history of asthma/pseudo-asthma who presented to the emergency department with productive cough for 1.5 weeks.  She also had severe left-sided abdominal, flank, and back pain. She was seen 1 week prior to admission by her pulmonologist to gave her medication for bronchitis and respiratory infection.  She was found to be in a-fib with RVR.  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  She was anticoagulated with heparin.  ECHO demonstrated severe mitral valve stenosis and moderate AV stenosis.  Her acute hypoxic respiratory failure, dyspnea, left atrial thrombus, atrial fibrillation, and moderate pulmonary hypertension are all probably secondary to her severe mitral stenosis.  Her course is also complicated by AKI secondary to a complete left renal infarction which is gradually resolving.  She has been awaiting improvement in her kidney function and enough diuresis to lie flat for right and left heart cath.  Continuing to work on volume status, rate control and Nephrology is consulting regarding AKI.  Most of her pre-surgery work up has been completed during this hospitalization.  Once she is adequately diuresed and has completed her right and left heart catheterization, she will need to be transitioned to warfarin and bridged possibly with lovenox.  She will undergo valve replacement surgery in 4-6 weeks.    Assessment/Plan  Severe mitral valve stenosis/mild to moderate aortic valve stenosis.  She also has severely reduced systolic function of the right ventricle and a moderately dilated RA, PA peak pressure of 64 mmHg suggesting moderate pulmonary hypertension. -  Cardiothoracic surgery recommends  anticoagulation for at least 4-6 weeks prior to proceeding with surgery. TEE prior to outpt f/u -  PFTs completed with possible moderate restrictive lung disease -  Carotid arteries 1-39% with antegrade VA flow Cardiac cath shows clean coronaries. Severe pulmonary hypertension.  A. fib with RVR, new onset and likely a complication from severe MR, chads2vasc score of 1 for female gender. On diltiazem and metoprolol with borderline BPs.  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  High risk for more thromboembolic events.  Hypercoag panel negative except for mildly low protein C level and activity and mildly low antithrombin III which were likely due to acute thrombus.  Also drawn while on heparin gtt (Cardiolipin, homocysteine, beta2-glycoprotein, lupus ac, protein S activity and total, prothrombin, and factor V leiden all negative.) Coumadin after cath. Will need bridge.  Acute hypoxic respiratory failure secondary to acute diastolic heart failure, resolving -  On RA -  Continue lasix  IV BID Still fluid overloaded  Left renal infarction likely due to thromboembolic event from left atrial thrombus.  Likely creatinine and new baseline.  Acute kidney injury, creatinine baseline of 0.9 and currently 1.5 -  Probably secondary to renal infarct, heart failure, and IV contrast -  No evidence of obstruction on CT  Hyponatremia, likely hypovolemic from heart failure and improving with diuresis  bilateral lower extremity edema -  Venous duplex LE neg for DVT  Hyperkalemia due to AKI, resolved with IV fluids and Kayexalate  Pyuria, urine culture negative.  abx completed  Constipation, resolved -  Continue colace, senna -  Lactulose prn  FMLA/short-term disability paperwork filled  out. Code Status: full Family Communication: patient and her family Disposition Plan:  Per  cardiology.  Consultants:  Cardiology, Dr. Royann Shivers  Vascular surgery by phone, Dr. Zachery Dakins  Nephrology, Dr. Arrie Aran  Cardiothoracic surgery, Dr. Barry Dienes  Procedures:  KUB  CT angio abd/pelvis  ECHO  CXR  Planned:  [facility] once kidney function improved  Carotid duplex:  1-39% stenosis bilaterally  Vascular duplex LE:  Neg for DVT  PFTs   Antibiotics:  Ceftriaxone 8/22 > 8/26  HPI/Subjective: Complains of slight headache. No new complaints. Objective: Filed Vitals:   09/13/14 1045 09/13/14 1050 09/13/14 1055 09/13/14 1100  BP: 92/67 77/59 106/63 88/69  Pulse: 77 84 79 64  Temp:      TempSrc:      Resp: 21 19 21 18   Height:      Weight:      SpO2: 96% 96% 96% 97%    Intake/Output Summary (Last 24 hours) at 09/13/14 1544 Last data filed at 09/13/14 0647  Gross per 24 hour  Intake      0 ml  Output   1250 ml  Net  -1250 ml   Filed Weights   09/10/14 0700 09/11/14 0558 09/12/14 0600  Weight: 115.078 kg (253 lb 11.2 oz) 114.306 kg (252 lb) 114.17 kg (251 lb 11.2 oz)   Body mass index is 50.81 kg/(m^2).  Exam:   General:  Adult female, NAD, sitting up in recliner  Cardiovascular:  IRRR, 2/6 murmur,  Respiratory:  CTA without WRR  Abdomen:   NABS, soft, nondistended, obese, nontender  MSK:   Normal tone and bulk, 2+ LLE edema and 2+ RLE edema  Data Reviewed: Basic Metabolic Panel:  Recent Labs Lab 09/09/14 0117 09/10/14 0241 09/11/14 0543 09/12/14 0408 09/13/14 0554  NA 128* 132* 130* 132* 133*  K 3.9 4.0 4.4 4.6 4.6  CL 96* 98* 97* 97* 98*  CO2 22 24 23 26 27   GLUCOSE 122* 140* 101* 96 100*  BUN 34* 31* 29* 29* 30*  CREATININE 2.03* 1.81* 1.50* 1.48* 1.55*  CALCIUM 8.0* 8.1* 8.3* 8.5* 8.5*  PHOS 3.3 2.7 2.3* 2.7 3.0   Liver Function Tests:  Recent Labs Lab 09/09/14 0117 09/10/14 0241 09/11/14 0543 09/12/14 0408 09/13/14 0554  ALBUMIN 2.2* 2.3* 2.3* 2.3* 2.3*   No results for input(s): LIPASE, AMYLASE in the last 168  hours. No results for input(s): AMMONIA in the last 168 hours. CBC:  Recent Labs Lab 09/07/14 0306 09/08/14 0431 09/09/14 0117 09/10/14 1515 09/12/14 1557  WBC 23.2* 18.9* 15.2* 16.7* 18.1*  HGB 12.0 12.8 12.6 12.4 11.7*  HCT 35.8* 37.6 37.7 37.7 35.9*  MCV 86.3 85.6 86.5 88.1 87.1  PLT 233 255 255 301 316    Recent Results (from the past 240 hour(s))  Urine culture     Status: None   Collection Time: 09/03/14  8:35 PM  Result Value Ref Range Status   Specimen Description URINE, CLEAN CATCH  Final   Special Requests NONE  Final   Culture   Final    MULTIPLE SPECIES PRESENT, SUGGEST RECOLLECTION Performed at [facility]    Report Status 09/05/2014 FINAL  Final  MRSA PCR Screening     Status: None   Collection Time: 09/03/14  9:27 PM  Result Value Ref Range Status   MRSA by PCR NEGATIVE NEGATIVE Final    Comment:        The GeneXpert MRSA Assay (FDA approved for NASAL specimens only), is one component of a  comprehensive MRSA colonization surveillance program. It is not intended to diagnose MRSA infection nor to guide or monitor treatment for MRSA infections.   Urine culture     Status: None   Collection Time: 09/05/14  9:59 AM  Result Value Ref Range Status   Specimen Description URINE, RANDOM  Final   Special Requests ADDED [id number]  Final   Culture NO GROWTH 1 DAY  Final   Report Status 09/06/2014 FINAL  Final     Studies: No results found.  Scheduled Meds: . coumadin book   Does not apply Once  . diltiazem  90 mg Oral 3 times per day  . docusate sodium  100 mg Oral BID  . [START ON 09/14/2014] furosemide  40 mg Intravenous Q12H  . magnesium chloride  1 tablet Oral BID  . metoprolol tartrate  50 mg Oral BID  . mometasone-formoterol  2 puff Inhalation BID  . pantoprazole  40 mg Oral Daily  . pneumococcal 23 valent vaccine  0.5 mL Intramuscular Tomorrow-1000  . potassium chloride  20 mEq Oral Daily  . pravastatin  40 mg Oral q1800  . senna  2  tablet Oral QHS  . sodium chloride  3 mL Intravenous Q12H  . sodium chloride  3 mL Intravenous Q12H  . warfarin  7.5 mg Oral ONCE-1800  . warfarin   Does not apply Once  . Warfarin - Pharmacist Dosing Inpatient   Does not apply q1800   Continuous Infusions: . heparin      Principal Problem:   Atrial fibrillation with rapid ventricular response Active Problems:   Renal infarction   Hyperkalemia   Left atrial thrombus   Acute renal artery occlusion   Mitral valve stenosis and aortic valve stenosis   Rheumatic mitral stenosis   Rheumatic aortic stenosis   Chronic diastolic heart failure   Acute cystitis without hematuria   Lower extremity edema   Renal insufficiency   Mitral valve stenosis  Time spent: 35 min at bedside including filling out short-term disability paperwork  Jerita Wimbush L  [facility] [email or web address], password [facility] 09/13/2014, 3:44 PM  LOS: 10 days

## 2014-09-13 NOTE — Progress Notes (Signed)
PT Cancellation Note  Patient Details Name: [name] [name] MRN: [id number] DOB: 1955-11-06   Cancelled Treatment:    Reason Eval/Treat Not Completed: Patient declined, no reason specified.  Pt reports she has a HA and is about to eat a salad that her daughter brought for her.  She would like for me to come back later.  If time allows I will check on her later, otherwise, it will be tomorrow. Thanks,    Rollene Rotunda. Miyu Fenderson, PT, DPT [phone]   09/13/2014, 3:03 PM

## 2014-09-13 NOTE — Progress Notes (Signed)
PT Cancellation Note  Patient Details Name: JARYN HOCUTT MRN: [id number] DOB: 28-Nov-1955   Cancelled Treatment:    Reason Eval/Treat Not Completed: Medical issues which prohibited therapy.  Pt on bedrest s/p femoral sheath removal.  PT to check back after 3 pm. Thanks,    Lurena Joiner B. Dixie Coppa, PT, DPT [phone]   09/13/2014, 11:34 AM

## 2014-09-13 NOTE — Progress Notes (Signed)
ANTICOAGULATION CONSULT NOTE - Follow Up Consult  Pharmacy Consult for heparin Indication: atrial clot and infaracted kidney.   Allergies  Allergen Reactions  . Codeine Itching    Patient Measurements: Height:  (149.9 cm) Weight: 251 lb 11.2 oz (114.17 kg) IBW/kg (Calculated) : 43.2 Heparin Dosing Weight: 72 kg  Vital Signs: Temp: 97.5 F (36.4 C) (09/01 0437) Temp Source: Oral (09/01 0437) BP: 88/69 mmHg (09/01 1100) Pulse Rate: 64 (09/01 1100)  Labs:  Recent Labs  09/10/14 1515 09/11/14 0543 09/12/14 0408 09/12/14 1557 09/13/14 0554  HGB 12.4  --   --  11.7*  --   HCT 37.7  --   --  35.9*  --   PLT 301  --   --  316  --   LABPROT  --   --   --   --  16.5*  INR  --   --   --   --  1.32  HEPARINUNFRC  --  0.62 0.56  --  1.36*  CREATININE  --  1.50* 1.48*  --  1.55*    Estimated Creatinine Clearance: 44.7 mL/min (by C-G formula based on Cr of 1.55).  Assessment:  59yo female on heparin for atrial clot and infarcted kidney. Heparin level is supratherapeutic (HL=1.36) on 2250 units/h.  She is now s/p cath and to restart heparin. Plans for TEE in 4-6 weeks to evaluate LA thrombus and severity of AS. Spoke with Dr. Patty Sermons and Dr. Eldridge Dace and ok to resume coumadin tonight -sheath removed ~ 10am and TR band in place (to be removed at about 1:30pm -INR 1.32 today   Goal of Therapy:  Heparin level 0.3-0.7 units/ml Monitor platelets by anticoagulation protocol: Yes   Plan - Resume heparin at 2050 units/hr at 3pm -Heparin level in 6 hours and daily wth CBC daily -Coumadin 7.5mg  po today -Daily PT/INR  Harland German, Pharm D 09/13/2014 12:44 PM

## 2014-09-13 NOTE — Progress Notes (Addendum)
ANTICOAGULATION CONSULT NOTE - Follow Up Consult  Pharmacy Consult for heparin Indication: atrial clot and infaracted kidney.   Allergies  Allergen Reactions  . Codeine Itching    Patient Measurements: Height:  (149.9 cm) Weight: 251 lb 11.2 oz (114.17 kg) IBW/kg (Calculated) : 43.2 Heparin Dosing Weight: 72 kg  Vital Signs: Temp: 97.5 F (36.4 C) (09/01 0437) Temp Source: Oral (09/01 0437) BP: 95/61 mmHg (09/01 0600) Pulse Rate: 78 (09/01 0437)  Labs:  Recent Labs  09/10/14 1515 09/11/14 0543 09/12/14 0408 09/12/14 1557 09/13/14 0554  HGB 12.4  --   --  11.7*  --   HCT 37.7  --   --  35.9*  --   PLT 301  --   --  316  --   LABPROT  --   --   --   --  16.5*  INR  --   --   --   --  1.32  HEPARINUNFRC  --  0.62 0.56  --  1.36*  CREATININE  --  1.50* 1.48*  --  1.55*    Estimated Creatinine Clearance: 44.7 mL/min (by C-G formula based on Cr of 1.55).  Assessment:  59yo female on heparin for atrial clot and infarcted kidney. Heparin level is supratherapeutic (HL=1.36) on 2250 units/h.  Plans for cath today thenTEE in 4-6 weeks to evaluate LA thrombus and severity of AS.   Goal of Therapy:  Heparin level 0.3-0.7 units/ml Monitor platelets by anticoagulation protocol: Yes   Plan:  - Hold heparin for 30 minutes then decrease to 2050 units/hr - Daily HL and CBC - Planning coumadin post cath- Will follow post cath  Harland German, Pharm D 09/13/2014 8:20 AM

## 2014-09-14 ENCOUNTER — Encounter (HOSPITAL_COMMUNITY): Payer: Self-pay | Admitting: Interventional Cardiology

## 2014-09-14 DIAGNOSIS — I342 Nonrheumatic mitral (valve) stenosis: Secondary | ICD-10-CM

## 2014-09-14 DIAGNOSIS — I350 Nonrheumatic aortic (valve) stenosis: Secondary | ICD-10-CM

## 2014-09-14 LAB — RENAL FUNCTION PANEL
ALBUMIN: 2.4 g/dL — AB (ref 3.5–5.0)
ANION GAP: 9 (ref 5–15)
BUN: 30 mg/dL — ABNORMAL HIGH (ref 6–20)
CALCIUM: 8.7 mg/dL — AB (ref 8.9–10.3)
CO2: 24 mmol/L (ref 22–32)
CREATININE: 1.6 mg/dL — AB (ref 0.44–1.00)
Chloride: 101 mmol/L (ref 101–111)
GFR calc Af Amer: 40 mL/min — ABNORMAL LOW (ref >60)
GFR calc non Af Amer: 34 mL/min — ABNORMAL LOW (ref >60)
GLUCOSE: 142 mg/dL — AB (ref 65–99)
PHOSPHORUS: 3.4 mg/dL (ref 2.5–4.6)
Potassium: 5 mmol/L (ref 3.5–5.1)
SODIUM: 134 mmol/L — AB (ref 135–145)

## 2014-09-14 LAB — CBC
HEMATOCRIT: 35.8 % — AB (ref 36.0–46.0)
HEMOGLOBIN: 11.7 g/dL — AB (ref 12.0–15.0)
MCH: 28.5 pg (ref 26.0–34.0)
MCHC: 32.7 g/dL (ref 30.0–36.0)
MCV: 87.1 fL (ref 78.0–100.0)
Platelets: 327 10*3/uL (ref 150–400)
RBC: 4.11 MIL/uL (ref 3.87–5.11)
RDW: 15.3 % (ref 11.5–15.5)
WBC: 15.3 10*3/uL — ABNORMAL HIGH (ref 4.0–10.5)

## 2014-09-14 LAB — HEPARIN LEVEL (UNFRACTIONATED): Heparin Unfractionated: 0.65 IU/mL (ref 0.30–0.70)

## 2014-09-14 LAB — PROTIME-INR
INR: 1.28 (ref 0.00–1.49)
Prothrombin Time: 16.2 seconds — ABNORMAL HIGH (ref 11.6–15.2)

## 2014-09-14 MED ORDER — ENOXAPARIN SODIUM 120 MG/0.8ML ~~LOC~~ SOLN
1.0000 mg/kg | Freq: Two times a day (BID) | SUBCUTANEOUS | Status: DC
  Administered 2014-09-14 – 2014-09-17 (×8): 115 mg via SUBCUTANEOUS
  Filled 2014-09-14 (×8): qty 0.8

## 2014-09-14 MED ORDER — WARFARIN SODIUM 7.5 MG PO TABS
7.5000 mg | ORAL_TABLET | Freq: Once | ORAL | Status: AC
  Administered 2014-09-14: 7.5 mg via ORAL
  Filled 2014-09-14: qty 1

## 2014-09-14 MED ORDER — FUROSEMIDE 40 MG PO TABS
40.0000 mg | ORAL_TABLET | Freq: Two times a day (BID) | ORAL | Status: DC
  Administered 2014-09-14 – 2014-09-17 (×6): 40 mg via ORAL
  Filled 2014-09-14 (×7): qty 1

## 2014-09-14 NOTE — Progress Notes (Signed)
OT Cancellation Note  Patient Details Name: ZYRAH WISWELL MRN: [id number] DOB: 10-09-1955   Cancelled Treatment:    Reason Eval/Treat Not Completed: Fatigue/lethargy limiting ability to participate.   Angelene Giovanni Ringgold, OTR/L [phone]  09/14/2014, 3:41 PM

## 2014-09-14 NOTE — Progress Notes (Signed)
[address]       [place] [address]             [phone]     CARDIOTHORACIC SURGERY PROGRESS NOTE  1 Day Post-Op  S/P Procedure(s) (LRB): Right/Left Heart Cath and Coronary Angiography (N/A)  Subjective: Feels well.  Denies SOB.  Ambulated fairly well  Objective: Vital signs in last 24 hours: Temp:  [97.7 F (36.5 C)-97.9 F (36.6 C)] 97.9 F (36.6 C) (09/02 0437) Pulse Rate:  [62-147] 100 (09/02 1140) Cardiac Rhythm:  [-] Atrial fibrillation (09/02 0843) Resp:  [18] 18 (09/02 0437) BP: (89-105)/(55-76) 97/57 mmHg (09/02 1118) SpO2:  [95 %-98 %] 98 % (09/02 1021) Weight:  [115.168 kg (253 lb 14.4 oz)] 115.168 kg (253 lb 14.4 oz) (09/02 9147)  Physical Exam:  Rhythm:   Afib  Breath sounds: clear  Heart sounds:  irregular  Incisions:  n/a  Abdomen:  soft  Extremities:  warm   Intake/Output from previous day: 09/01 0701 - 09/02 0700 In: 772.2 [P.O.:120; I.V.:652.2] Out: -  Intake/Output this shift: Total I/O In: -  Out: 1000 [Urine:1000]  Lab Results:  Recent Labs  09/12/14 1557  WBC 18.1*  HGB 11.7*  HCT 35.9*  PLT 316   BMET:  Recent Labs  09/13/14 0554 09/14/14 0515  NA 133* 134*  K 4.6 5.0  CL 98* 101  CO2 27 24  GLUCOSE 100* 142*  BUN 30* 30*  CREATININE 1.55* 1.60*  CALCIUM 8.5* 8.7*    CBG (last 3)  No results for input(s): GLUCAP in the last 72 hours. PT/INR:   Recent Labs  09/14/14 0515  LABPROT 16.2*  INR 1.28    CXR:  N/A  CARDIAC CATHETERIZATION Procedures    Right/Left Heart Cath and Coronary Angiography    PACS Images    Show images for Cardiac catheterization     Link to Procedure Log    Procedure Log      Indications    Mitral stenosis [I05.0 (ICD-10-CM)]    Technique and Indications    The risks, benefits, and details of the procedure were explained to the patient. The patient verbalized understanding and wanted to proceed. Informed written consent was  obtained.  PROCEDURE TECHNIQUE: After Xylocaine anesthesia a 16F slender sheath was placed in the right radial artery with a single anterior needle wall stick. IV Heparin was given. Right coronary angiography was done using a Judkins R4 guide catheter. Left coronary angiography was done using a Judkins L3.5 guide catheter. Left ventriculography was done using a pigtail catheter. A TR band was used for hemostasis.  A 7 French sheath was placed in the right common femoral vein after Xylocaine anesthesia using modified Seldinger technique. A 7 [place] balloontipped Swan-Ganz catheter was advanced the pulmonary artery under fluoroscopic guidance. Hemodynamic pressure assessment was performed. Oxygen saturations were obtained.  Contrast: 30 cc  Estimated blood loss: 30 Estimated blood loss <50 mL. There were no immediate complications during the procedure.    Conclusion     No significant coronary artery disease.  Severe pulmonary artery hypertension. Cardiac output 3.5 L/m. Cardiac index 1.7. Pulmonary artery saturation 42%.  Severe mitral stenosis. Valve area 0.58 cm.  Mild aortic valve gradient.  The patient will follow-up with Dr. Cornelius Moras regarding mitral valve surgery. Restart heparin in 4 hours. Further management per the inpatient team.       Coronary Findings    Dominance: Right     Right Heart  Pressures Hemodynamic findings consistent with severe pulmonary hypertension. LV EDP is normal.    Left Heart    Mitral Valve There is severe mitral valve stenosis.   Aortic Valve There is mild aortic valve stenosis.    Coronary Diagrams    Diagnostic Diagram            Implants    Name ID Temporary Type Supply   No information to display    Hemo Data       Most Recent Value   Fick Cardiac Output  3.52 L/min   Fick Cardiac Output Index  1.74 (L/min)/BSA   Aortic Mean Gradient  16.7 mmHg   Aortic Peak Gradient  13 mmHg   Aortic Valve Area  1.14    Aortic Value Area Index  0.56 cm2/BSA   Mitral Mean Gradient  22.3 mmHg   Mitral Peak Gradient  20 mmHg   Mitral Valve Area Index  0.29 cm2/BSA   RA A Wave  21 mmHg   RA V Wave  22 mmHg   RA Mean  20 mmHg   RV Systolic Pressure  83 mmHg   RV Diastolic Pressure  9 mmHg   RV EDP  16 mmHg   PA Systolic Pressure  78 mmHg   PA Diastolic Pressure  42 mmHg   PA Mean  53 mmHg   PW A Wave  35 mmHg   PW V Wave  36 mmHg   PW Mean  34 mmHg   AO Systolic Pressure  98 mmHg   AO Diastolic Pressure  67 mmHg   AO Mean  81 mmHg   LV Systolic Pressure  113 mmHg   LV Diastolic Pressure  6 mmHg   LV EDP  14 mmHg   Arterial Occlusion Pressure Extended Systolic Pressure  99 mmHg   Arterial Occlusion Pressure Extended Diastolic Pressure  65 mmHg   Arterial Occlusion Pressure Extended Mean Pressure  80 mmHg   Left Ventricular Apex Extended Systolic Pressure  117 mmHg   Left Ventricular Apex Extended Diastolic Pressure  5 mmHg   Left Ventricular Apex Extended EDP Pressure  16 mmHg   QP/QS  1   TPVR Index  30.44 HRUI   TSVR Index  46.51 HRUI   PVR SVR Ratio  0.33   TPVR/TSVR Ratio  0.65     Assessment/Plan: S/P Procedure(s) (LRB): Right/Left Heart Cath and Coronary Angiography (N/A)  Clinically stable Results of cath discussed with patient I will arrange for her to see me in the office in approximately 6 weeks after her f/u TEE has been performed  I spent in excess of 15 minutes during the conduct of this hospital encounter and >50% of this time involved direct face-to-face encounter with the patient for counseling and/or coordination of their care.   Purcell Nails 09/14/2014 1:36 PM

## 2014-09-14 NOTE — Progress Notes (Signed)
Patient ID: [name] [name], female   DOB: [birth year], 59 y.o.   MRN: [id number] S: LHC/RHC w/ normal coronaries, pHTN.  Doingw ell, making urine.  SCr stable O:BP 97/57 mmHg  Pulse 62  Temp(Src) 97.9 F (36.6 C) (Oral)  Resp 18  Ht  (1.499 m)  Wt 115.168 kg (253 lb 14.4 oz)  BMI 51.25 kg/m2  SpO2 97%  Intake/Output Summary (Last 24 hours) at 09/14/14 1140 Last data filed at 09/14/14 1057  Gross per 24 hour  Intake 772.22 ml  Output   1000 ml  Net -227.78 ml   Intake/Output: I/O last 3 completed shifts: In: 772.2 [P.O.:120; I.V.:652.2] Out: 1100 [Urine:1100]  Intake/Output this shift:  Total I/O In: -  Out: 1000 [Urine:1000] Weight change:  Gen:WD obese AAF in NAD CVS:IRR IRR no rub Resp:decreased BS at bases NWG:NFAOZ, +BS, soft Ext:3+ edema   Recent Labs Lab 09/08/14 0431 09/09/14 0117 09/10/14 0241 09/11/14 0543 09/12/14 0408 09/13/14 0554 09/14/14 0515  NA 129* 128* 132* 130* 132* 133* 134*  K 4.2 3.9 4.0 4.4 4.6 4.6 5.0  CL 99* 96* 98* 97* 97* 98* 101  CO2 16* GLUCOSE 80 122* 140* 101* 96 100* 142*  BUN 35* 34* 31* 29* 29* 30* 30*  CREATININE 2.09* 2.03* 1.81* 1.50* 1.48* 1.55* 1.60*  ALBUMIN 2.5* 2.2* 2.3* 2.3* 2.3* 2.3* 2.4*  CALCIUM 8.3* 8.0* 8.1* 8.3* 8.5* 8.5* 8.7*  PHOS 3.7 3.3 2.7 2.3* 2.7 3.0 3.4   Liver Function Tests:  Recent Labs Lab 09/12/14 0408 09/13/14 0554 09/14/14 0515  ALBUMIN 2.3* 2.3* 2.4*   No results for input(s): LIPASE, AMYLASE in the last 168 hours. No results for input(s): AMMONIA in the last 168 hours. CBC:  Recent Labs Lab 09/08/14 0431 09/09/14 0117 09/10/14 1515 09/12/14 1557  WBC 18.9* 15.2* 16.7* 18.1*  HGB 12.8 12.6 12.4 11.7*  HCT 37.6 37.7 37.7 35.9*  MCV 85.6 86.5 88.1 87.1  PLT 255 255 301 316   Cardiac Enzymes: No results for input(s): CKTOTAL, CKMB, CKMBINDEX, TROPONINI in the last 168 hours. CBG: No results for input(s): GLUCAP in the last 168 hours.  Iron  Studies: No results for input(s): IRON, TIBC, TRANSFERRIN, FERRITIN in the last 72 hours. Studies/Results: No results found. . diltiazem  90 mg Oral 3 times per day  . docusate sodium  100 mg Oral BID  . enoxaparin (LOVENOX) injection  1 mg/kg Subcutaneous Q12H  . furosemide  40 mg Oral BID  . magnesium chloride  1 tablet Oral BID  . metoprolol tartrate  50 mg Oral BID  . mometasone-formoterol  2 puff Inhalation BID  . pantoprazole  40 mg Oral Daily  . pneumococcal 23 valent vaccine  0.5 mL Intramuscular Tomorrow-1000  . pravastatin  40 mg Oral q1800  . senna  2 tablet Oral QHS  . sodium chloride  3 mL Intravenous Q12H  . sodium chloride  3 mL Intravenous Q12H  . warfarin  7.5 mg Oral ONCE-1800  . warfarin   Does not apply Once  . Warfarin - Pharmacist Dosing Inpatient   Does not apply q1800    BMET    Component Value Date/Time   NA 134* 09/14/2014 0515   K 5.0 09/14/2014 0515   CL 101 09/14/2014 0515   CO2 24 09/14/2014 0515   GLUCOSE 142* 09/14/2014 0515   BUN 30* 09/14/2014 0515   CREATININE 1.60* 09/14/2014 0515   CALCIUM 8.7* 09/14/2014 0515  GFRNONAA 34* 09/14/2014 0515   GFRAA 40* 09/14/2014 0515   CBC    Component Value Date/Time   WBC 18.1* 09/12/2014 1557   RBC 4.12 09/12/2014 1557   HGB 11.7* 09/12/2014 1557   HCT 35.9* 09/12/2014 1557   PLT 316 09/12/2014 1557   MCV 87.1 09/12/2014 1557   MCH 28.4 09/12/2014 1557   MCHC 32.6 09/12/2014 1557   RDW 15.2 09/12/2014 1557   LYMPHSABS 1.0 09/03/2014 1600   MONOABS 0.6 09/03/2014 1600   EOSABS 0.0 09/03/2014 1600   BASOSABS 0.0 09/03/2014 1600     Assessment/Plan:  1. AKI in setting of acute left renal infarct as well as hypotension and IV contrast. Non-oliguric.  1. Has reached nadir, likely new baseline is ~1.5 2. Appears to have tolerated LHC and RHC from renal view 3. OK with resuming diuretics, per cardiology 2. 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 on heparin TCTS and cardiology following 3. HTN- stable 4. Hyponatremia- stable, mild 5. 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  Please call with any questions or concerns.  Pt does need follow up with nephrology and I will make arrangements.     [name]

## 2014-09-14 NOTE — Progress Notes (Signed)
Came to give patient dulera inhaler, RN at bedside bathing patient. Waited RN still at bedside at this time.

## 2014-09-14 NOTE — Progress Notes (Signed)
CARDIAC REHAB PHASE I   PRE:  Rate/Rhythm: 90 afib  BP:  Supine:   Sitting: 90/64  Standing:    SaO2: 99%RA  MODE:  Ambulation: 160 ft   POST:  Rate/Rhythm: 139 afib  BP:  Supine:   Sitting: 120/76  Standing:    SaO2: 98%RA 1445-1525 Pt walked 160 ft on RA with gait belt use and rolling walker and asst x 1 and one asst to follow with recliner. Pt sat twice to rest. Heart rate to 139. Pt c/o SOB and seeing stars at times during walk. To [facility] after walk with call bell and husband in room. BP better after walk. Pt to call staff when finished.   Luetta Nutting, RN BSN  09/14/2014 3:23 PM

## 2014-09-14 NOTE — Care Management Note (Addendum)
Case Management Note  Patient Details  Name: SHARIN ALTIDOR MRN: [id number] Date of Birth: 16-Jan-1955  Subjective/Objective: Pt admitted for Afib RVR. Pt has family support. Plan is to return home once medically stable.                 Action/Plan: CM did speak with pt in regards to [facility] services. Pt felt like she would not need HHRN @ d/c. Pt is agreeable to [facility] PT services and once home PT will evaluate to see if need Nursing services. CM did make referral with [facility] for services. SOC ot begin within 24-48 hours post d/c. Referral made to [facility] for DME to be delivered. Liaison for [facility] to see if meets requirement for Bariatric 3n1. DME may be delivered to home. Pt will need orders for HHPT and DME 3n1 and RW. No further needs from CM at this time.   Expected Discharge Date:  09/07/14               Expected Discharge Plan:  Home/Self Care  In-House Referral:  NA  Discharge planning Services  CM Consult  Post Acute Care Choice:  Durable Medical Equipment, Home Health Choice offered to:  Patient  DME Arranged:  3-N-1, Walker rolling DME Agency:  [facility]  HH Arranged:  PT, RN [facility] Agency:  Other - See comment [facility])  Status of Service:  Completed, signed off  Medicare Important Message Given:    Date Medicare IM Given:    Medicare IM give by:    Date Additional Medicare IM Given:    Additional Medicare Important Message give by:     If discussed at Long Length of Stay Meetings, dates discussed:    Additional Comments: 1121 09-18-14 Tomi Bamberger, RN,BSN [phone] CM did speak with pt in regards to DME. Orders placed for DME and CM did contact [facility] for delivery. Orders to be placed for [facility] PT / RN services with [facility]. [facility] agency aware of d/c. SOC to begin within 24-48 hrs post d/c. No further needs from CM at this time.   Gala Lewandowsky, RN 09/14/2014, 4:16 PM

## 2014-09-14 NOTE — Progress Notes (Signed)
[facility] PROGRESS NOTE  [name] [name] WUJ:[id number] DOB: 31-May-1955 DOA: 09/03/2014 PCP: Delorse Lek, MD  Brief Summary  The patient is a 59 year old female with history of asthma/pseudo-asthma who presented to the emergency department with productive cough for 1.5 weeks.  She also had severe left-sided abdominal, flank, and back pain. She was seen 1 week prior to admission by her pulmonologist to gave her medication for bronchitis and respiratory infection.  She was found to be in a-fib with RVR.  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  She was anticoagulated with heparin.  ECHO demonstrated severe mitral valve stenosis and moderate AV stenosis.  Her acute hypoxic respiratory failure, dyspnea, left atrial thrombus, atrial fibrillation, and moderate pulmonary hypertension are all probably secondary to her severe mitral stenosis.  Her course is also complicated by AKI secondary to a complete left renal infarction which is gradually resolving.  She has been awaiting improvement in her kidney function and enough diuresis to lie flat for right and left heart cath.  Continuing to work on volume status, rate control and Nephrology is consulting regarding AKI.  Most of her pre-surgery work up has been completed during this hospitalization.  Once she is adequately diuresed and has completed her right and left heart catheterization, she will need to be transitioned to warfarin and bridged possibly with lovenox.  She will undergo valve replacement surgery in 4-6 weeks.    Assessment/Plan  Severe mitral valve stenosis/mild to moderate aortic valve stenosis.  She also has severely reduced systolic function of the right ventricle and a moderately dilated RA, PA peak pressure of 64 mmHg suggesting moderate pulmonary hypertension. -  Cardiothoracic surgery recommends  anticoagulation for at least 4-6 weeks prior to proceeding with surgery. Repeat TEE and possible amiodarone load prior to surgery -  Carotid arteries 1-39% with antegrade VA flow Cardiac cath shows clean coronaries. Severe pulmonary hypertension.  A. fib with RVR, on Coumadin. Per Dr. Patty Sermons, okay to switch to Lovenox. Have asked pharmacy to dose both.  Large left atrial thrombus  High risk for more thromboembolic events.  Hypercoag panel negative except for mildly low protein C level and activity and mildly low antithrombin III which were likely due to acute thrombus.  Also drawn while on heparin gtt (Cardiolipin, homocysteine, beta2-glycoprotein, lupus ac, protein S activity and total, prothrombin, and factor V leiden all negative.) Repeat TEE prior to surgery.  Acute hypoxic respiratory failure secondary to acute diastolic heart failure, resolving Still fluid overloaded. On by mouth Lasix per cardiology.  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  Likely creatinine at new baseline.  Acute kidney injury, creatinine baseline of 0.9 and currently 1.5 secondary to renal infarct, heart failure, and IV contrast. Stable post catheterization  Hyponatremia, likely hypovolemic from heart failure and improving with diuresis  bilateral lower extremity edema -  Venous duplex LE neg for DVT  Hyperkalemia due to AKI, resolved with IV fluids and Kayexalate  Pyuria, urine culture negative.  abx completed  Constipation, resolved -  Continue colace, senna -  Lactulose prn  Code Status: full Family Communication: patient and her family Disposition Plan:  Per cardiology.  Consultants:  Cardiology, Dr. Royann Shivers  Vascular surgery by phone, Dr. Zachery Dakins  Nephrology, Dr. Arrie Aran  Cardiothoracic surgery, Dr. Barry Dienes  Procedures:  KUB  CT angio abd/pelvis  ECHO  CXR  Planned:  [facility] once kidney function improved  Carotid duplex:  1-39%  stenosis bilaterally  Vascular duplex LE:  Neg for DVT  PFTs   Antibiotics:  Ceftriaxone 8/22 > 8/26  HPI/Subjective: Feeling better today. Walked with physical therapy. Still sleeping in recliner but breathing easier.  Objective: Filed Vitals:   09/14/14 1000 09/14/14 1021 09/14/14 1118 09/14/14 1140  BP: 89/55  97/57   Pulse:  147  100  Temp:      TempSrc:      Resp:      Height:      Weight:      SpO2:  98%      Intake/Output Summary (Last 24 hours) at 09/14/14 1230 Last data filed at 09/14/14 1057  Gross per 24 hour  Intake 772.22 ml  Output   1000 ml  Net -227.78 ml   Filed Weights   09/11/14 0558 09/12/14 0600 09/14/14 0608  Weight: 114.306 kg (252 lb) 114.17 kg (251 lb 11.2 oz) 115.168 kg (253 lb 14.4 oz)   Body mass index is 51.25 kg/(m^2).  Exam:   General:  Brighter today. In recliner.  Cardiovascular:  IRRR, 2/6 murmur,  Respiratory:  CTA without WRR  Abdomen:   NABS, soft, nondistended, obese, nontender  MSK:   Normal tone and bulk, 1+ bilateral edema  Data Reviewed: Basic Metabolic Panel:  Recent Labs Lab 09/10/14 0241 09/11/14 0543 09/12/14 0408 09/13/14 0554 09/14/14 0515  NA 132* 130* 132* 133* 134*  K 4.0 4.4 4.6 4.6 5.0  CL 98* 97* 97* 98* 101  CO2 GLUCOSE 140* 101* 96 100* 142*  BUN 31* 29* 29* 30* 30*  CREATININE 1.81* 1.50* 1.48* 1.55* 1.60*  CALCIUM 8.1* 8.3* 8.5* 8.5* 8.7*  PHOS 2.7 2.3* 2.7 3.0 3.4   Liver Function Tests:  Recent Labs Lab 09/10/14 0241 09/11/14 0543 09/12/14 0408 09/13/14 0554 09/14/14 0515  ALBUMIN 2.3* 2.3* 2.3* 2.3* 2.4*   No results for input(s): LIPASE, AMYLASE in the last 168 hours. No results for input(s): AMMONIA in the last 168 hours. CBC:  Recent Labs Lab 09/08/14 0431 09/09/14 0117 09/10/14 1515 09/12/14 1557  WBC 18.9* 15.2* 16.7* 18.1*  HGB 12.8 12.6 12.4 11.7*  HCT 37.6 37.7 37.7 35.9*  MCV 85.6 86.5 88.1 87.1  PLT 255 255 301 316    Recent Results  (from the past 240 hour(s))  Urine culture     Status: None   Collection Time: 09/05/14  9:59 AM  Result Value Ref Range Status   Specimen Description URINE, RANDOM  Final   Special Requests ADDED [id number]  Final   Culture NO GROWTH 1 DAY  Final   Report Status 09/06/2014 FINAL  Final     Studies: No results found.  Scheduled Meds: . diltiazem  90 mg Oral 3 times per day  . docusate sodium  100 mg Oral BID  . enoxaparin (LOVENOX) injection  1 mg/kg Subcutaneous Q12H  . furosemide  40 mg Oral BID  . magnesium chloride  1 tablet Oral BID  . metoprolol tartrate  50 mg Oral BID  . mometasone-formoterol  2 puff Inhalation BID  . pantoprazole  40 mg Oral Daily  . pneumococcal 23 valent vaccine  0.5 mL Intramuscular Tomorrow-1000  . pravastatin  40 mg Oral q1800  . senna  2 tablet Oral QHS  . sodium chloride  3 mL Intravenous Q12H  . sodium chloride  3 mL Intravenous Q12H  . warfarin  7.5 mg Oral ONCE-1800  . warfarin   Does not apply Once  . Warfarin - Pharmacist Dosing Inpatient   Does not apply q1800   Continuous Infusions:    Principal Problem:   Atrial fibrillation with rapid ventricular response Active Problems:   Renal infarction   Hyperkalemia   Left atrial thrombus   Acute renal artery occlusion   Mitral valve stenosis and aortic valve stenosis   Rheumatic mitral stenosis   Rheumatic aortic stenosis   Chronic diastolic heart failure   Acute cystitis without hematuria   Lower extremity edema   Renal insufficiency   Mitral valve stenosis  Time spent: 15 min at bedside including filling out short-term disability paperwork  Tiffancy Moger L  [facility] [email or web address], password [facility] 09/14/2014, 12:30 PM  LOS: 11 days

## 2014-09-14 NOTE — Progress Notes (Signed)
ANTICOAGULATION CONSULT NOTE - Follow Up Consult  Pharmacy Consult for Lovenox/Coumadin Indication: atrial clot and infaracted kidney.   Allergies  Allergen Reactions  . Codeine Itching    Patient Measurements: Height:  (149.9 cm) Weight: 253 lb 14.4 oz (115.168 kg) IBW/kg (Calculated) : 43.2  Vital Signs: Temp: 97.9 F (36.6 C) (09/02 0437) Temp Source: Oral (09/02 0437) BP: 102/68 mmHg (09/02 0437) Pulse Rate: 62 (09/02 0437)  Labs:  Recent Labs  09/12/14 0408 09/12/14 1557 09/13/14 0554 09/13/14 2130 09/14/14 0515  HGB  --  11.7*  --   --   --   HCT  --  35.9*  --   --   --   PLT  --  316  --   --   --   LABPROT  --   --  16.5*  --  16.2*  INR  --   --  1.32  --  1.28  HEPARINUNFRC 0.56  --  1.36* 0.64 0.65  CREATININE 1.48*  --  1.55*  --  1.60*    Estimated Creatinine Clearance: 43.6 mL/min (by C-G formula based on Cr of 1.6).  Assessment:  59yo female on anticoagulation for atrial clot and infarcted kidney. Plans for TEE in 4-6 weeks to evaluate LA thrombus and severity of AS.  Coumadin was started 9/1 and her INR is essentially unchanged as would be expected after only 1 dose of Coumadin.  Dr. Lendell Caprice requested a change from Heparin to Lovenox for bridging.  Note she has some renal insufficiency from her kidney infarction that may put her at some risk for Lovenox accumulation.  However with a short course of Lovenox anticipated will proceed and monitor closely.  Goal of Therapy:  INR 2-3 Anti-Xa level 0.6-1 units/ml 4hrs after LMWH dose given Monitor platelets by anticoagulation protocol: Yes   Plan Stop Heparin, Start Lovenox  SQ q12h Monitor renal function closely Check CBC at least q72h - next on 9/3 Monitor for bleeding complications Would consider antiXa monitoring for Lovenox if a prolonged course is needed Coumadin 7.5mg  today Daily PT/INR  Estella Husk, Pharm.D., BCPS, AAHIVP Clinical Pharmacist Phone: [phone] or  [phone] 09/14/2014, 10:07 AM

## 2014-09-14 NOTE — Progress Notes (Signed)
Patient Name: [name] [name] Date of Encounter: 09/14/2014     Principal Problem:   Atrial fibrillation with rapid ventricular response Active Problems:   Renal infarction   Hyperkalemia   Left atrial thrombus   Acute renal artery occlusion   Mitral valve stenosis and aortic valve stenosis   Rheumatic mitral stenosis   Rheumatic aortic stenosis   Chronic diastolic heart failure   Acute cystitis without hematuria   Lower extremity edema   Renal insufficiency   Mitral valve stenosis    SUBJECTIVE  Patient is doing well post-cath. No acute dyspnea. Rhythm atrial fibrillation with rate 80 at rest. Still has mild peripheral edema. BP low consistent with her severe mitral stenosis and pulmonary hypertension.  CURRENT MEDS . diltiazem  90 mg Oral 3 times per day  . docusate sodium  100 mg Oral BID  . furosemide  40 mg Intravenous Q12H  . magnesium chloride  1 tablet Oral BID  . metoprolol tartrate  50 mg Oral BID  . mometasone-formoterol  2 puff Inhalation BID  . pantoprazole  40 mg Oral Daily  . pneumococcal 23 valent vaccine  0.5 mL Intramuscular Tomorrow-1000  . pravastatin  40 mg Oral q1800  . senna  2 tablet Oral QHS  . sodium chloride  3 mL Intravenous Q12H  . sodium chloride  3 mL Intravenous Q12H  . warfarin   Does not apply Once  . Warfarin - Pharmacist Dosing Inpatient   Does not apply q1800    OBJECTIVE  Filed Vitals:   09/13/14 2020 09/14/14 0437 09/14/14 0608 09/14/14 0841  BP: 105/76 102/68    Pulse: 106 62    Temp: 97.7 F (36.5 C) 97.9 F (36.6 C)    TempSrc: Oral Oral    Resp:  18    Height:      Weight:   253 lb 14.4 oz (115.168 kg)   SpO2: 96% 98%  97%    Intake/Output Summary (Last 24 hours) at 09/14/14 0852 Last data filed at 09/13/14 2100  Gross per 24 hour  Intake 772.22 ml  Output      0 ml  Net 772.22 ml   Filed Weights   09/11/14 0558 09/12/14 0600 09/14/14 0608  Weight: 252 lb (114.306 kg) 251 lb 11.2 oz (114.17 kg) 253 lb  14.4 oz (115.168 kg)    PHYSICAL EXAM  General: Pleasant, NAD. Neuro: Alert and oriented X 3. Moves all extremities spontaneously. Psych: Normal affect. HEENT:  Normal  Neck: Supple without bruits or JVD. Lungs:  Resp regular and unlabored, CTA. Heart: Irregularly irregular. Grade 1/6 apical systolic murmur. Grade 1/6 diastolic rumble of mitral stenosis inside apex. Grade 2/6 systolic ejection murmur at aortic area. No S3. Abdomen: Obese, nontender. Extremities: No groin tenderness. Moderate pretibial edema. Accessory Clinical Findings  CBC  Recent Labs  09/12/14 1557  WBC 18.1*  HGB 11.7*  HCT 35.9*  MCV 87.1  PLT 316   Basic Metabolic Panel  Recent Labs  09/13/14 0554 09/14/14 0515  NA 133* 134*  K 4.6 5.0  CL 98* 101  CO2 27 24  GLUCOSE 100* 142*  BUN 30* 30*  CREATININE 1.55* 1.60*  CALCIUM 8.5* 8.7*  PHOS 3.0 3.4   Liver Function Tests  Recent Labs  09/13/14 0554 09/14/14 0515  ALBUMIN 2.3* 2.4*   No results for input(s): LIPASE, AMYLASE in the last 72 hours. Cardiac Enzymes No results for input(s): CKTOTAL, CKMB, CKMBINDEX, TROPONINI in the last 72 hours. BNP  Invalid input(s): POCBNP D-Dimer No results for input(s): DDIMER in the last 72 hours. Hemoglobin A1C No results for input(s): HGBA1C in the last 72 hours. Fasting Lipid Panel No results for input(s): CHOL, HDL, LDLCALC, TRIG, CHOLHDL, LDLDIRECT in the last 72 hours. Thyroid Function Tests No results for input(s): TSH, T4TOTAL, T3FREE, THYROIDAB in the last 72 hours.  Invalid input(s): FREET3  TELE  Atrial fibrillation. Rate 80  ECG    Radiology/Studies  Dg Chest 2 View  08/28/2014   CLINICAL DATA:  Shortness of breath and chest pain.  Mild fever.  EXAM: CHEST  2 VIEW  COMPARISON:  May 19, 2014  FINDINGS: There is no edema or consolidation. Heart is upper normal in size with pulmonary vascularity within normal limits. No adenopathy. No pneumothorax. No bone lesions.  IMPRESSION:  No edema or consolidation.   Electronically Signed   By: Bretta Bang III M.D.   On: 08/28/2014 09:47   Mr Brain Wo Contrast  09/07/2014   CLINICAL DATA:  Initial valuation for the increased gassiness, history of AFib, concern for stroke.  EXAM: MRI HEAD WITHOUT CONTRAST  TECHNIQUE: Multiplanar, multiecho pulse sequences of the brain and surrounding structures were obtained without intravenous contrast.  COMPARISON:  Prior study from 12/22/2004.  FINDINGS: No abnormal foci of restricted diffusion to suggest acute intracranial infarct. Gray-white matter differentiation maintained. Normal intravascular flow voids preserved. No acute or chronic intracranial hemorrhage.  Cerebral volume within normal limits for patient age. Minimal T2/FLAIR hyperintensity within the periventricular white matter, like related to chronic small vessel ischemic disease, felt to be within normal limits for patient age. No mass lesion, midline shift, or mass effect. No hydrocephalus. No extra-axial fluid collection.  Craniocervical junction within normal limits. Incidental note made of a partially empty sella. Pituitary gland otherwise unremarkable.  No acute abnormality about the orbits.  Paranasal sinuses are clear. No mastoid effusion. Inner ear structures grossly normal.  Bone marrow signal intensity normal. Scalp soft tissues within normal limits.  IMPRESSION: Negative brain MRI with no acute intracranial infarct or other process identified.   Electronically Signed   By: Rise Mu M.D.   On: 09/07/2014 04:43   Dg Chest Port 1 View  09/05/2014   CLINICAL DATA:  Follow-up of elevated white blood cell count  EXAM: PORTABLE CHEST - 1 VIEW  COMPARISON:  Chest x-ray of September 03, 2014  FINDINGS: The patient is positioned in a lordotic matter today which limits the apparent expansion of the lungs. The interstitial markings remain increased and there is subsegmental atelectasis at the right lung base. There is a small  amount of fluid in the minor fissure. The cardiac silhouette remains enlarged. The pulmonary vascularity is more conspicuous today. The bony thorax exhibits no acute abnormality.  IMPRESSION: CHF with mild interstitial edema. Subsegmental atelectasis at the right lung base. When the patient can tolerate the procedure, a PA and lateral chest x-ray would be useful.   Electronically Signed   By: David  [place] M.D.   On: 09/05/2014 08:17   Dg Abd Acute W/chest  09/03/2014   CLINICAL DATA:  Congestion for 2 weeks. History of heart murmur. Vomiting.  EXAM: DG ABDOMEN ACUTE W/ 1V CHEST  COMPARISON:  08/30/2014 and 05/19/2014  FINDINGS: Interstitial markings are mildly prominent but similar to the previous examination. Heart size is slightly prominent but stable. The trachea is midline. There is no evidence to suggest free air. Gas in the stomach. Small amount of gas and stool in the  colon. Nonobstructive bowel gas pattern. Mild degenerative changes at the pubic symphysis.  IMPRESSION: Slightly prominent lung markings which are similar to the previous examinations. Difficult to exclude mild interstitial edema or vascular congestion.  Gaseous distention of the stomach.   Electronically Signed   By: Richarda Overlie M.D.   On: 09/03/2014 18:51   Ct Cta Abd/pel W/cm &/or W/o Cm  09/03/2014   CLINICAL DATA:  59 year old with left-sided abdominal pain for 2 weeks. New onset of atrial fibrillation.  EXAM: CT ANGIOGRAPHY ABDOMEN AND PELVIS  TECHNIQUE: Multidetector CT imaging of the abdomen and pelvis was performed using the standard protocol during bolus administration of intravenous contrast. Multiplanar reconstructed images including MIPs were obtained and reviewed to evaluate the vascular anatomy.  CONTRAST:  100 mL Omnipaque 350  COMPARISON:  Abdominal radiographs 09/03/2014  FINDINGS: ARTERIAL FINDINGS:  Aorta/Heart: Large filling defects in the posterior left atrium are most compatible with thrombus. The entire left atrium  is not imaged. Right atrium appears to be enlarged. Cannot exclude thrombus in the right pulmonary veins. The abdominal aorta is patent without aneurysm or dissection.  Celiac axis: Celiac trunk and main branch vessels are patent, including the splenic artery.  Superior mesenteric: The superior mesenteric artery is patent. Main branch vessels are patent.  Left renal: Occlusion of the left renal artery just beyond the origin. Minimal blood flow to the left kidney even on the delayed images.  Right renal:         Right renal artery is patent.  Inferior mesenteric: Patent.  Left iliac: Left iliac arteries are patent. The proximal left femoral arteries are patent.  Right iliac: Right iliac arteries are patent. The proximal right femoral arteries are patent.  Venous findings: IVC and renal veins are patent. Portal venous system is patent.  Review of the MIP images confirms the above findings.  NONVASCULAR FINDINGS:  There is a small-moderate sized right pleural effusion. Few patchy densities in the right middle lobe may represent atelectasis. No gross abnormality to the liver. There is a small amount of perihepatic ascites and fluid around the gallbladder. Suspect a small amount of periportal edema. No gross abnormality to the spleen or adrenal glands. No gross abnormality to the pancreas. Suspect scarring along the right kidney lower pole. The left kidney is edematous with minimal arterial flow. Findings compatible with acute vascular occlusion of the left renal artery with left kidney ischemia.  Some of the images are limited by motion artifact. No gross abnormality to the stomach or duodenum. Small amount of free fluid in the pelvis. Uterus is absent. No gross abnormality to the urinary bladder. No gross abnormality to the small or large bowel.  No acute bone abnormality.  IMPRESSION: Large filling defect in the left atrium is suggestive for thrombus. An underlying lesion cannot be excluded. This may be better  characterized with an echocardiogram.  Occlusion of the proximal left renal artery with no significant flow to the left kidney. 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 Findings compatible with thromboembolic disease originating from the left atrium.  Small amount of fluid in the right abdomen and pelvis.  Critical Value/emergent results were called by telephone at the time of interpretation on 09/03/2014 at 8:46 pm to Dr. Benjiman Core , who verbally acknowledged these results.   Electronically Signed   By: Richarda Overlie M.D.   On: 09/03/2014 21:05    ASSESSMENT AND PLAN 1. Rheumatic heart dx with severe MS and mild  to moderate AS, RV dysfunction, pulmonary hypertension from AS/MS, and tricuspid regurgitation - CT surgery recommended 4-6 weeks of warfarin for left atrial thrombus and possible amiodarone loading to maintain sinus rhythm.  Cath showed aortic valve gradient to be mild.  Mean gradient 16.7 There was severe MS mean gradient 22. Severe pulmonary hypertension.  RVsystolic pressure 83  2. New onset atrial fibrillation:  - Rate was in 110's with peaks in the 150's on 09/10/14 but with Metoprolol increase from 25mg  BID to 50mg  BID her rate has responded well and is now mostly in the 80's with one peak in the 120's.  This patients CHA2DS2-VASc Score = 3 ( Female, Embolic Event) - Continue metoprolol 50mg  BID and diltiazem 90mg  TID. - Continue IV heparin. Will eventually need 4-6 weeks of warfarin for left atrial thrombus.  3. Acute diastolic HF:  Lasix was held yesterday morning before cath.  4. HTN: - borderline low now with systolic 90-110  5. 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  - CTA of abdomen 09/03/2014 large filling defect in LA suggestive for thrombus, occlusion of prox L renal artery with no significant flow to L kidney, compatible with acute arterial occlusion with L kidney infarction. - currently on  heparin.  - Nephrology following. - Cr 1.2 on 8/22, peaked on 8/26 at 2.38, appears to have settled at 1.55.  Today 1.60 following cath.  Will switch from IV to oral lasix today.  Plan: Resume physical therapy, encourage increase in ambulation. Possibly home in several days. INR 1.28 today. Signed, Cassell Clement MD

## 2014-09-14 NOTE — Progress Notes (Signed)
Physical Therapy Treatment Patient Details Name: [name] [name] MRN: [id number] DOB: 1955/05/13 Today's Date: 09/14/2014    History of Present Illness   [name] [name] is a 59 y.o. female with past medical history significant for asthma, hypercholesterolemia, valvular disease and obesity. She presented to the emergency department on 8/22 with complaints of a productive cough and also severe left sided abdominal flank and back pain for 3 days. She was noted to be in atrial fibrillation at the time and that was new for her. She underwent a CT of the abdomen with contrast and that was notable for an atrial thrombus and what was felt to be an acute occlusion of the left renal artery with minimal to no flow to the left kidney.   During hospitalization her HR has been difficult to control with symptoms felt to be related to severe mitral stenosis.  It is felt that she will ultimately need a mitral valve repair/replacement.       PT Comments    Pt is progressing well with her ambulation, however, she has to take multiple seated rest breaks due to high HR (147 max observed) and weakness.  O2 sats remained stable on RA during gait despite 2/4 DOE.  Pt continues to benefit from acute PT and f/u PT at discharge.  Follow Up Recommendations  Home health PT;Supervision - Intermittent     Equipment Recommendations  Rolling walker with 5" wheels;3in1 (PT) (wide/bariatric)    Recommendations for Other Services   NA     Precautions / Restrictions Precautions Precautions: Other (comment) (monitor O2 sats and HR with gait. )    Mobility  Bed Mobility               General bed mobility comments: pt is up in the chair  Transfers Overall transfer level: Needs assistance Equipment used: Rolling walker (2 wheeled) Transfers: Sit to/from Stand Sit to Stand: Supervision;Min guard         General transfer comment: Supervision at first, min guard once fatigued. Multiple sit to stands from  recliner chair due to multiple seated rest breaks during gait.   Ambulation/Gait Ambulation/Gait assistance: Min guard;+2 safety/equipment (chair to follow to encourage increased gait distance and res) Ambulation Distance (Feet): 150 Feet Assistive device: Rolling walker (2 wheeled) Gait Pattern/deviations: Step-through pattern;Shuffle;Trunk flexed Gait velocity: decreased Gait velocity interpretation: <1.8 ft/sec, indicative of risk for recurrent falls General Gait Details: Pt at times resting forearms on RW as she fatigued, encouraged pt to stand tall for lung expansion.  Three seated rest breaks due to fatigue and increased HR during gait.  HR max observed was 147.  With seated rest breaks will go back down to 100s.         Balance Overall balance assessment: Needs assistance         Standing balance support: Bilateral upper extremity supported Standing balance-Leahy Scale: Poor                      Cognition Arousal/Alertness: Awake/alert Behavior During Therapy: WFL for tasks assessed/performed Overall Cognitive Status: Within Functional Limits for tasks assessed                             Pertinent Vitals/Pain Pain Assessment: No/denies pain (reports some soreness in her feet bil)           PT Goals (current goals can now be found in the care plan section)  Acute Rehab PT Goals Patient Stated Goal: to walk further with less fatigue.  Progress towards PT goals: Progressing toward goals    Frequency  Min 3X/week    PT Plan Current plan remains appropriate       End of Session   Activity Tolerance: Patient limited by fatigue;Treatment limited secondary to medical complications (Comment) (limited by HR) Patient left: in chair;with call bell/phone within reach;with family/visitor present (husband in room)     Time: 1021-1050 PT Time Calculation (min) (ACUTE ONLY): 29 min  Charges:  $Gait Training: 23-37 mins                     Brix Brearley  B. Jaquise Faux, PT, DPT [phone]   09/14/2014, 12:21 PM

## 2014-09-15 DIAGNOSIS — I5032 Chronic diastolic (congestive) heart failure: Secondary | ICD-10-CM

## 2014-09-15 DIAGNOSIS — R0600 Dyspnea, unspecified: Secondary | ICD-10-CM

## 2014-09-15 LAB — CBC
HCT: 35.5 % — ABNORMAL LOW (ref 36.0–46.0)
HEMOGLOBIN: 11.5 g/dL — AB (ref 12.0–15.0)
MCH: 28.9 pg (ref 26.0–34.0)
MCHC: 32.4 g/dL (ref 30.0–36.0)
MCV: 89.2 fL (ref 78.0–100.0)
PLATELETS: 321 10*3/uL (ref 150–400)
RBC: 3.98 MIL/uL (ref 3.87–5.11)
RDW: 15.6 % — ABNORMAL HIGH (ref 11.5–15.5)
WBC: 16.6 10*3/uL — ABNORMAL HIGH (ref 4.0–10.5)

## 2014-09-15 LAB — BASIC METABOLIC PANEL
Anion gap: 9 (ref 5–15)
BUN: 30 mg/dL — AB (ref 6–20)
CHLORIDE: 100 mmol/L — AB (ref 101–111)
CO2: 25 mmol/L (ref 22–32)
CREATININE: 1.79 mg/dL — AB (ref 0.44–1.00)
Calcium: 8.8 mg/dL — ABNORMAL LOW (ref 8.9–10.3)
GFR calc Af Amer: 35 mL/min — ABNORMAL LOW (ref >60)
GFR calc non Af Amer: 30 mL/min — ABNORMAL LOW (ref >60)
GLUCOSE: 127 mg/dL — AB (ref 65–99)
POTASSIUM: 4.6 mmol/L (ref 3.5–5.1)
Sodium: 134 mmol/L — ABNORMAL LOW (ref 135–145)

## 2014-09-15 LAB — PROTIME-INR
INR: 1.35 (ref 0.00–1.49)
Prothrombin Time: 16.8 seconds — ABNORMAL HIGH (ref 11.6–15.2)

## 2014-09-15 MED ORDER — DILTIAZEM HCL 60 MG PO TABS
60.0000 mg | ORAL_TABLET | Freq: Four times a day (QID) | ORAL | Status: DC
  Administered 2014-09-15 – 2014-09-18 (×12): 60 mg via ORAL
  Filled 2014-09-15 (×13): qty 1

## 2014-09-15 MED ORDER — METOPROLOL TARTRATE 1 MG/ML IV SOLN
2.5000 mg | Freq: Once | INTRAVENOUS | Status: AC
  Administered 2014-09-15: 2.5 mg via INTRAVENOUS
  Filled 2014-09-15: qty 5

## 2014-09-15 MED ORDER — WARFARIN SODIUM 10 MG PO TABS
10.0000 mg | ORAL_TABLET | Freq: Once | ORAL | Status: AC
  Administered 2014-09-15: 10 mg via ORAL
  Filled 2014-09-15: qty 1

## 2014-09-15 NOTE — Progress Notes (Signed)
ANTICOAGULATION CONSULT NOTE - Follow Up Consult  Pharmacy Consult for Lovenox/Coumadin Indication: atrial clot and infaracted kidney.   Allergies  Allergen Reactions  . Codeine Itching    Patient Measurements: Height:  (149.9 cm) Weight: 252 lb 2 oz (114.363 kg) IBW/kg (Calculated) : 43.2  Vital Signs: Temp: 98.1 F (36.7 C) (09/03 0518) Temp Source: Oral (09/03 0518) BP: 95/64 mmHg (09/03 0518) Pulse Rate: 95 (09/03 0518)  Labs:  Recent Labs  09/12/14 1557 09/13/14 0554 09/13/14 2130 09/14/14 0515 09/14/14 1550 09/15/14 0258  HGB 11.7*  --   --   --  11.7* 11.5*  HCT 35.9*  --   --   --  35.8* 35.5*  PLT 316  --   --   --  327 321  LABPROT  --  16.5*  --  16.2*  --  16.8*  INR  --  1.32  --  1.28  --  1.35  HEPARINUNFRC  --  1.36* 0.64 0.65  --   --   CREATININE  --  1.55*  --  1.60*  --  1.79*    Estimated Creatinine Clearance: 38.8 mL/min (by C-G formula based on Cr of 1.79).  Assessment:  59yo female on anticoagulation for atrial clot and infarcted kidney. Plans for TEE in 4-6 weeks to evaluate LA thrombus and severity of AS.  Coumadin was started 9/1, INR subtherapeutic at 1.35.  Dr. Lendell Caprice requested a change from Heparin to Lovenox for bridging.  Note she has some renal insufficiency from her kidney infarction that may put her at some risk for Lovenox accumulation.  However with a short course of Lovenox anticipated will proceed and monitor closely.  Goal of Therapy:  INR 2-3 Anti-Xa level 0.6-1 units/ml 4hrs after LMWH dose given Monitor platelets by anticoagulation protocol: Yes   Plan Lovenox  SQ q12h Coumadin  today Monitor renal function closely Check CBC at least q72h Monitor for bleeding complications Would consider antiXa monitoring for Lovenox if a prolonged course is needed Daily PT/INR   Sherron Monday, PharmD Clinical Pharmacy Resident Pager: [phone] 09/15/2014 7:45 AM

## 2014-09-15 NOTE — Progress Notes (Signed)
Subjective: No CP  NO SOB at rest   Objective: Filed Vitals:   09/14/14 1604 09/14/14 2022 09/15/14 0518 09/15/14 0609  BP: 91/63 101/64 95/64   Pulse: 78 74 95   Temp: 97.8 F (36.6 C) 98.3 F (36.8 C) 98.1 F (36.7 C)   TempSrc: Oral Oral Oral   Resp: Height:      Weight:    252 lb 2 oz (114.363 kg)  SpO2: 99% 98% 95%    Weight change: -1 lb 12.4 oz (-0.805 kg)  Intake/Output Summary (Last 24 hours) at 09/15/14 0741 Last data filed at 09/15/14 1610  Gross per 24 hour  Intake    360 ml  Output   2100 ml  Net  -1740 ml    General: Alert, awake, oriented x3, in no acute distress Neck:  JVP is normal Heart: Regular rate and rhythm, No signif murmurs, rubs, gallops.  Lungs: Clear to auscultation.  No rales or wheezes. Exemities:  1-2+ edema.   Neuro: Grossly intact, nonfocal.  Tel:  Afib  80s   Lab Results: Results for orders placed or performed during the hospital encounter of 09/03/14 (from the past 24 hour(s))  CBC     Status: Abnormal   Collection Time: 09/14/14  3:50 PM  Result Value Ref Range   WBC 15.3 (H) 4.0 - 10.5 K/uL   RBC 4.11 3.87 - 5.11 MIL/uL   Hemoglobin 11.7 (L) 12.0 - 15.0 g/dL   HCT 96.0 (L) 45.4 - 09.8 %   MCV 87.1 78.0 - 100.0 fL   MCH 28.5 26.0 - 34.0 pg   MCHC 32.7 30.0 - 36.0 g/dL   RDW 11.9 14.7 - 82.9 %   Platelets 327 150 - 400 K/uL  Protime-INR     Status: Abnormal   Collection Time: 09/15/14  2:58 AM  Result Value Ref Range   Prothrombin Time 16.8 (H) 11.6 - 15.2 seconds   INR 1.35 0.00 - 1.49  CBC     Status: Abnormal   Collection Time: 09/15/14  2:58 AM  Result Value Ref Range   WBC 16.6 (H) 4.0 - 10.5 K/uL   RBC 3.98 3.87 - 5.11 MIL/uL   Hemoglobin 11.5 (L) 12.0 - 15.0 g/dL   HCT 56.2 (L) 13.0 - 86.5 %   MCV 89.2 78.0 - 100.0 fL   MCH 28.9 26.0 - 34.0 pg   MCHC 32.4 30.0 - 36.0 g/dL   RDW 78.4 (H) 69.6 - 29.5 %   Platelets 321 150 - 400 K/uL  Basic metabolic panel     Status: Abnormal   Collection Time:  09/15/14  2:58 AM  Result Value Ref Range   Sodium 134 (L) 135 - 145 mmol/L   Potassium 4.6 3.5 - 5.1 mmol/L   Chloride 100 (L) 101 - 111 mmol/L   CO2 25 22 - 32 mmol/L   Glucose, Bld 127 (H) 65 - 99 mg/dL   BUN 30 (H) 6 - 20 mg/dL   Creatinine, Ser 2.84 (H) 0.44 - 1.00 mg/dL   Calcium 8.8 (L) 8.9 - 10.3 mg/dL   GFR calc non Af Amer 30 (L) >60 mL/min   GFR calc Af Amer 35 (L) >60 mL/min   Anion gap 9 5 - 15    Studies/Results: No results found.  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  Pulmonary HTN  (RVSP 83  mm Hg) and TR. CT surgery recomm 4 to 6 wks coumadin for L atrial thrombus.  Possible amiodarone load.    2.  Afib with RVR  Rats are controlled  Getting coumadin  INR still not therapeutic    3  Acute diastolic CHF  Volume is still up  She is on po lasix   Defer to nephrology for dosing    4.  HTN  BP is low  Cut back on dilt to 60 q 6    5.  Renal infarct  Continue heparin.  Nephrology following.    LOS: 12 days   Dietrich Pates 09/15/2014, 7:41 AM

## 2014-09-15 NOTE — Progress Notes (Signed)
[facility] PROGRESS NOTE  KAAVYA PUSKARICH WUJ:[id number] DOB: 31-May-1955 DOA: 09/03/2014 PCP: Delorse Lek, MD  Brief Summary  The patient is a 59 year old female with history of asthma/pseudo-asthma who presented to the emergency department with productive cough for 1.5 weeks.  She also had severe left-sided abdominal, flank, and back pain. She was seen 1 week prior to admission by her pulmonologist to gave her medication for bronchitis and respiratory infection.  She was found to be in a-fib with RVR.  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  She was anticoagulated with heparin.  ECHO demonstrated severe mitral valve stenosis and moderate AV stenosis.  Her acute hypoxic respiratory failure, dyspnea, left atrial thrombus, atrial fibrillation, and moderate pulmonary hypertension are all probably secondary to her severe mitral stenosis.  Her course is also complicated by AKI secondary to a complete left renal infarction which is gradually resolving.  She has been awaiting improvement in her kidney function and enough diuresis to lie flat for right and left heart cath.  Continuing to work on volume status, rate control and Nephrology is consulting regarding AKI.  Most of her pre-surgery work up has been completed during this hospitalization.  Once she is adequately diuresed and has completed her right and left heart catheterization, she will need to be transitioned to warfarin and bridged possibly with lovenox.  She will undergo valve replacement surgery in 4-6 weeks.    Assessment/Plan  Severe mitral valve stenosis/mild to moderate aortic valve stenosis.  She also has severely reduced systolic function of the right ventricle and a moderately dilated RA, PA peak pressure of 64 mmHg suggesting moderate pulmonary hypertension. -  Cardiothoracic surgery recommends  anticoagulation for at least 4-6 weeks prior to proceeding with surgery. Repeat TEE and possible amiodarone load prior to surgery -  Carotid arteries 1-39% with antegrade VA flow Cardiac cath shows clean coronaries. Severe pulmonary hypertension.  A. fib with RVR, on Coumadin. Per Dr. Patty Sermons, okay to switch to Lovenox. Have asked pharmacy to dose both.  Large left atrial thrombus  High risk for more thromboembolic events.  Hypercoag panel negative except for mildly low protein C level and activity and mildly low antithrombin III which were likely due to acute thrombus.  Also drawn while on heparin gtt (Cardiolipin, homocysteine, beta2-glycoprotein, lupus ac, protein S activity and total, prothrombin, and factor V leiden all negative.) Repeat TEE prior to surgery.  Acute hypoxic respiratory failure secondary to acute diastolic heart failure, resolving Still fluid overloaded. On by mouth Lasix per cardiology.  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  Likely creatinine at new baseline.  Acute kidney injury, creatinine baseline of 0.9 and currently 1.5 secondary to renal infarct, heart failure, and IV contrast. Stable post catheterization  Hyponatremia, likely hypovolemic from heart failure and improving with diuresis  bilateral lower extremity edema -  Venous duplex LE neg for DVT  Hyperkalemia due to AKI, resolved with IV fluids and Kayexalate  Pyuria, urine culture negative.  abx completed  Constipation, resolved -  Continue colace, senna -  Lactulose prn  Code Status: full Family Communication: patient and her family Disposition Plan:  Per cardiology.  Consultants:  Cardiology, Dr. Royann Shivers  Vascular surgery by phone, Dr. Zachery Dakins  Nephrology, Dr. Arrie Aran  Cardiothoracic surgery, Dr. Barry Dienes  Procedures:  KUB  CT angio abd/pelvis  ECHO  CXR  Planned:  [facility] once kidney function improved  Carotid duplex:  1-39%  stenosis bilaterally  Vascular duplex LE:  Neg for DVT  PFTs   Antibiotics:  Ceftriaxone 8/22 > 8/26  HPI/Subjective: Feeling better today. Walked with physical therapy. Still sleeping in recliner but breathing easier.  Objective: Filed Vitals:   09/15/14 0609 09/15/14 0905 09/15/14 0920 09/15/14 1234  BP:  102/62  110/70  Pulse:  90 71   Temp:      TempSrc:      Resp:   18   Height:      Weight: 114.363 kg (252 lb 2 oz)     SpO2:   96%     Intake/Output Summary (Last 24 hours) at 09/15/14 1256 Last data filed at 09/15/14 0611  Gross per 24 hour  Intake    360 ml  Output   1100 ml  Net   -740 ml   Filed Weights   09/12/14 0600 09/14/14 0608 09/15/14 0609  Weight: 114.17 kg (251 lb 11.2 oz) 115.168 kg (253 lb 14.4 oz) 114.363 kg (252 lb 2 oz)   Body mass index is 50.9 kg/(m^2).  Exam:   General:  Brighter today. In recliner.  Cardiovascular:  IRRR, 2/6 murmur,  Respiratory:  CTA without WRR  Abdomen:   NABS, soft, nondistended, obese, nontender  MSK:   Normal tone and bulk, 1+ bilateral edema  Data Reviewed: Basic Metabolic Panel:  Recent Labs Lab 09/10/14 0241 09/11/14 0543 09/12/14 0408 09/13/14 0554 09/14/14 0515 09/15/14 0258  NA 132* 130* 132* 133* 134* 134*  K 4.0 4.4 4.6 4.6 5.0 4.6  CL 98* 97* 97* 98* 101 100*  CO2 24 23 26 27 24 25   GLUCOSE 140* 101* 96 100* 142* 127*  BUN 31* 29* 29* 30* 30* 30*  CREATININE 1.81* 1.50* 1.48* 1.55* 1.60* 1.79*  CALCIUM 8.1* 8.3* 8.5* 8.5* 8.7* 8.8*  PHOS 2.7 2.3* 2.7 3.0 3.4  --    Liver Function Tests:  Recent Labs Lab 09/10/14 0241 09/11/14 0543 09/12/14 0408 09/13/14 0554 09/14/14 0515  ALBUMIN 2.3* 2.3* 2.3* 2.3* 2.4*   No results for input(s): LIPASE, AMYLASE in the last 168 hours. No results for input(s): AMMONIA in the last 168 hours. CBC:  Recent Labs Lab 09/09/14 0117 09/10/14 1515 09/12/14 1557 09/14/14 1550 09/15/14 0258  WBC 15.2* 16.7* 18.1* 15.3* 16.6*  HGB 12.6 12.4  11.7* 11.7* 11.5*  HCT 37.7 37.7 35.9* 35.8* 35.5*  MCV 86.5 88.1 87.1 87.1 89.2  PLT 255 301 316 327 321    No results found for this or any previous visit (from the past 240 hour(s)).   Studies: No results found.  Scheduled Meds: . diltiazem  60 mg Oral 4 times per day  . docusate sodium  100 mg Oral BID  . enoxaparin (LOVENOX) injection  1 mg/kg Subcutaneous Q12H  . furosemide  40 mg Oral BID  . magnesium chloride  1 tablet Oral BID  . metoprolol tartrate  50 mg Oral BID  . mometasone-formoterol  2 puff Inhalation BID  . pantoprazole  40 mg Oral Daily  . pneumococcal 23 valent vaccine  0.5 mL Intramuscular Tomorrow-1000  . pravastatin  40 mg Oral q1800  . senna  2 tablet Oral QHS  . sodium chloride  3 mL Intravenous Q12H  . sodium chloride  3 mL Intravenous Q12H  . warfarin  10 mg Oral ONCE-1800  . warfarin   Does not apply Once  . Warfarin - Pharmacist Dosing  Inpatient   Does not apply q1800   Continuous Infusions:    Principal Problem:   Atrial fibrillation with rapid ventricular response Active Problems:   Renal infarction   Hyperkalemia   Left atrial thrombus   Acute renal artery occlusion   Mitral valve stenosis and aortic valve stenosis   Rheumatic mitral stenosis   Rheumatic aortic stenosis   Chronic diastolic heart failure   Acute cystitis without hematuria   Lower extremity edema   Renal insufficiency   Mitral valve stenosis  Time spent: 15 min at bedside including filling out short-term disability paperwork  Rosali Augello L  [facility] [email or web address], password [facility] 09/15/2014, 12:56 PM  LOS: 12 days

## 2014-09-16 LAB — PROTIME-INR
INR: 1.56 — ABNORMAL HIGH (ref 0.00–1.49)
Prothrombin Time: 18.7 seconds — ABNORMAL HIGH (ref 11.6–15.2)

## 2014-09-16 MED ORDER — WARFARIN SODIUM 10 MG PO TABS
10.0000 mg | ORAL_TABLET | Freq: Once | ORAL | Status: AC
  Administered 2014-09-16: 10 mg via ORAL
  Filled 2014-09-16: qty 1

## 2014-09-16 NOTE — Progress Notes (Signed)
[facility] PROGRESS NOTE  [name] [name] ZOX:[id number] DOB: 06/22/1955 DOA: 09/03/2014 PCP: Delorse Lek, MD  Brief Summary  The patient is a 59 year old female with history of asthma/pseudo-asthma who presented to the emergency department with productive cough for 1.5 weeks.  She also had severe left-sided abdominal, flank, and back pain. She was seen 1 week prior to admission by her pulmonologist to gave her medication for bronchitis and respiratory infection.  She was found to be in a-fib with RVR.  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  She was anticoagulated with heparin.  ECHO demonstrated severe mitral valve stenosis and moderate AV stenosis.  Her acute hypoxic respiratory failure, dyspnea, left atrial thrombus, atrial fibrillation, and moderate pulmonary hypertension are all probably secondary to her severe mitral stenosis.  Her course is also complicated by AKI secondary to a complete left renal infarction which is gradually resolving.  .  Most of her pre-surgery work up has been completed during this hospitalization.  She will undergo valve replacement surgery in 4-6 weeks.    Assessment/Plan  Severe mitral valve stenosis/mild to moderate aortic valve stenosis.  She also has severely reduced systolic function of the right ventricle and a moderately dilated RA, PA peak pressure of 64 mmHg suggesting moderate pulmonary hypertension. -  Cardiothoracic surgery recommends anticoagulation for at least 4-6 weeks prior to proceeding with surgery. Repeat TEE and possible amiodarone load prior to surgery Cardiac cath shows clean coronaries. Severe pulmonary hypertension.  A. fib with RVR, on Coumadin/lovenox bridge  Large left atrial thrombus  Hypercoag panel negative except for mildly low protein C level and activity and mildly low antithrombin III  which were likely due to acute thrombus.  Also drawn while on heparin gtt (Cardiolipin, homocysteine, beta2-glycoprotein, lupus ac, protein S activity and total, prothrombin, and factor V leiden all negative.) Repeat TEE prior to surgery.  Lovenox/coumadin per pharmacy  Acute hypoxic respiratory failure secondary to acute diastolic heart failure, improving Still fluid overloaded. On by mouth Lasix per cardiology.  Weight has plateu'd  Left renal infarction likely due to thromboembolic event from left atrial thrombus.  Likely creatinine at new baseline.  Acute kidney injury, creatinine baseline of 0.9 and currently 1.8 secondary to renal infarct, heart failure, and IV contrast. Monitor. Nephrology signed of  Hyponatremia, improving  bilateral lower extremity edema -  Venous duplex LE neg for DVT  Hyperkalemia due to AKI, resolved with IV fluids and Kayexalate  Pyuria, urine culture negative.  abx completed  Code Status: full Family Communication: patient and her family Disposition Plan:  Home when Coumadin therapeutic and okay with cardiology  Consultants:  Cardiology, Dr. Royann Shivers  Vascular surgery by phone, Dr. Zachery Dakins  Nephrology, Dr. Arrie Aran  Cardiothoracic surgery, Dr. Barry Dienes  Procedures:  KUB  CT angio abd/pelvis  ECHO  CXR  Planned:  [facility] once kidney function improved  Carotid duplex:  1-39% stenosis bilaterally  Vascular duplex LE:  Neg for DVT  PFTs   Antibiotics:  Ceftriaxone 8/22 > 8/26  HPI/Subjective: No new complaints.  Objective: Filed Vitals:   09/15/14 2048 09/15/14 2320 09/16/14 0521 09/16/14 0920  BP:  100/58 94/60   Pulse: 132  81   Temp:  98.1 F (36.7 C) 97.6 F (36.4 C)   TempSrc:  Oral Oral   Resp:  20 20   Height:      Weight:  114.488 kg (252 lb 6.4 oz)   SpO2:  97% 100% 98%    Intake/Output Summary (Last 24 hours) at 09/16/14 1359 Last data filed at 09/16/14 0500  Gross per 24 hour  Intake    350 ml  Output     650 ml  Net   -300 ml   Filed Weights   09/14/14 0608 09/15/14 0609 09/16/14 0521  Weight: 115.168 kg (253 lb 14.4 oz) 114.363 kg (252 lb 2 oz) 114.488 kg (252 lb 6.4 oz)   Body mass index is 50.95 kg/(m^2).  Exam:   General:  Alert and oriented. In recliner.  Cardiovascular:  IRRR, 2/6 murmur,  Respiratory:  CTA without WRR  Abdomen:   NABS, soft, nondistended, obese, nontender  MSK:   Normal tone and bulk, 1+ bilateral edema  Data Reviewed: Basic Metabolic Panel:  Recent Labs Lab 09/10/14 0241 09/11/14 0543 09/12/14 0408 09/13/14 0554 09/14/14 0515 09/15/14 0258  NA 132* 130* 132* 133* 134* 134*  K 4.0 4.4 4.6 4.6 5.0 4.6  CL 98* 97* 97* 98* 101 100*  CO2 GLUCOSE 140* 101* 96 100* 142* 127*  BUN 31* 29* 29* 30* 30* 30*  CREATININE 1.81* 1.50* 1.48* 1.55* 1.60* 1.79*  CALCIUM 8.1* 8.3* 8.5* 8.5* 8.7* 8.8*  PHOS 2.7 2.3* 2.7 3.0 3.4  --    Liver Function Tests:  Recent Labs Lab 09/10/14 0241 09/11/14 0543 09/12/14 0408 09/13/14 0554 09/14/14 0515  ALBUMIN 2.3* 2.3* 2.3* 2.3* 2.4*   No results for input(s): LIPASE, AMYLASE in the last 168 hours. No results for input(s): AMMONIA in the last 168 hours. CBC:  Recent Labs Lab 09/10/14 1515 09/12/14 1557 09/14/14 1550 09/15/14 0258  WBC 16.7* 18.1* 15.3* 16.6*  HGB 12.4 11.7* 11.7* 11.5*  HCT 37.7 35.9* 35.8* 35.5*  MCV 88.1 87.1 87.1 89.2  PLT 301 316 327 321    No results found for this or any previous visit (from the past 240 hour(s)).   Studies: No results found.  Scheduled Meds: . diltiazem  60 mg Oral 4 times per day  . docusate sodium  100 mg Oral BID  . enoxaparin (LOVENOX) injection  1 mg/kg Subcutaneous Q12H  . furosemide  40 mg Oral BID  . magnesium chloride  1 tablet Oral BID  . metoprolol tartrate  50 mg Oral BID  . mometasone-formoterol  2 puff Inhalation BID  . pantoprazole  40 mg Oral Daily  . pneumococcal 23 valent vaccine  0.5 mL Intramuscular  Tomorrow-1000  . pravastatin  40 mg Oral q1800  . senna  2 tablet Oral QHS  . sodium chloride  3 mL Intravenous Q12H  . sodium chloride  3 mL Intravenous Q12H  . warfarin  10 mg Oral ONCE-1800  . warfarin   Does not apply Once  . Warfarin - Pharmacist Dosing Inpatient   Does not apply q1800   Continuous Infusions:    Principal Problem:   Atrial fibrillation with rapid ventricular response Active Problems:   Renal infarction   Hyperkalemia   Left atrial thrombus   Acute renal artery occlusion   Mitral valve stenosis and aortic valve stenosis   Rheumatic mitral stenosis   Rheumatic aortic stenosis   Chronic diastolic heart failure   Acute cystitis without hematuria   Lower extremity edema   Renal insufficiency   Mitral valve stenosis  Time spent: 15 min at bedside including filling out short-term disability paperwork  Genine Beckett L  [facility] [email or web address], password [facility] 09/16/2014, 1:59 PM  LOS: 13 days

## 2014-09-16 NOTE — Progress Notes (Signed)
ANTICOAGULATION CONSULT NOTE - Follow Up Consult  Pharmacy Consult for Lovenox/Coumadin Indication: atrial clot and infaracted kidney.   Allergies  Allergen Reactions  . Codeine Itching    Patient Measurements: Height:  (149.9 cm) Weight: 252 lb 6.4 oz (114.488 kg) IBW/kg (Calculated) : 43.2  Vital Signs: Temp: 97.6 F (36.4 C) (09/04 0521) Temp Source: Oral (09/04 0521) BP: 94/60 mmHg (09/04 0521) Pulse Rate: 81 (09/04 0521)  Labs:  Recent Labs  09/13/14 2130 09/14/14 0515 09/14/14 1550 09/15/14 0258 09/16/14 0603  HGB  --   --  11.7* 11.5*  --   HCT  --   --  35.8* 35.5*  --   PLT  --   --  327 321  --   LABPROT  --  16.2*  --  16.8* 18.7*  INR  --  1.28  --  1.35 1.56*  HEPARINUNFRC 0.64 0.65  --   --   --   CREATININE  --  1.60*  --  1.79*  --     Estimated Creatinine Clearance: 38.8 mL/min (by C-G formula based on Cr of 1.79).  Assessment:  59yo female on anticoagulation for atrial clot and infarcted kidney. Plans for TEE in 4-6 weeks to evaluate LA thrombus and severity of AS.  Coumadin was started 9/1, INR subtherapeutic at 1.56, but is trending up.  Dr. Lendell Caprice requested a change from Heparin to Lovenox for bridging.  Note she has some renal insufficiency from her kidney infarction that may put her at some risk for Lovenox accumulation.  However with a short course of Lovenox anticipated will proceed and monitor closely.  Goal of Therapy:  INR 2-3 Anti-Xa level 0.6-1 units/ml 4hrs after LMWH dose given Monitor platelets by anticoagulation protocol: Yes   Plan Lovenox  SQ q12h Coumadin  today Monitor renal function closely Check CBC at least q72h Monitor for bleeding complications Would consider antiXa monitoring for Lovenox if a prolonged course is needed Daily PT/INR   Sherron Monday, PharmD Clinical Pharmacy Resident Pager: [phone] 09/16/2014 8:15 AM

## 2014-09-16 NOTE — Progress Notes (Signed)
   Subjective: No CP  No SOB at rest   Objective: Filed Vitals:   09/15/14 1955 09/15/14 2048 09/15/14 2320 09/16/14 0521  BP: 98/70  100/58 94/60  Pulse:  132  81  Temp: 97.6 F (36.4 C)  98.1 F (36.7 C) 97.6 F (36.4 C)  TempSrc: Oral  Oral Oral  Resp: Height:      Weight:    252 lb 6.4 oz (114.488 kg)  SpO2: 97%  97% 100%   Weight change: 4.4 oz (0.125 kg)  Intake/Output Summary (Last 24 hours) at 09/16/14 0740 Last data filed at 09/16/14 0500  Gross per 24 hour  Intake    350 ml  Output    650 ml  Net   -300 ml    General: Alert, awake, oriented x3, in no acute distress Neck:  JVP is increased   Heart: Irregular rate and rhythm, Distant Lungs: Rales at R base   Exemities:  1+edema.   Neuro: Grossly intact, nonfocal.   Lab Results: Results for orders placed or performed during the hospital encounter of 09/03/14 (from the past 24 hour(s))  Protime-INR     Status: Abnormal   Collection Time: 09/16/14  6:03 AM  Result Value Ref Range   Prothrombin Time 18.7 (H) 11.6 - 15.2 seconds   INR 1.56 (H) 0.00 - 1.49    Studies/Results: No results found.  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 Pulmonary HTN (RVSP 83 mm Hg) and TR. CT surgery recomm 4 to 6 wks coumadin for L atrial thrombus. Possible amiodarone load.   2. Afib with RVR Rats are controlled Getting coumadin INR has bumped some    3 Acute diastolic CHF Volume is still up She is on po lasixContinue BID  Check labs in AM   4. HTN BP is low at times  Follow  Needs dilt for rate control    5. Renal infarct Continue lovenox and coumadin  Pt notes bowels are looser than on admit  Constipated then  May be due to magnesium  She does not want to change    LOS: 12 days   LOS: 13 days   Dietrich Pates 09/16/2014, 7:40 AM

## 2014-09-16 NOTE — Progress Notes (Signed)
Patient had 3.2 sec pause on monitor while sleeping in chair. Remains in A-fib rate in the 70s-80s. Notified Dr. Alphonsus Sias and received OK to give morning Cardizem this am. Will continue to monitor and update him with any changes.

## 2014-09-16 NOTE — Progress Notes (Signed)
Patient up to the BSC, HR 130s. Returned to chair and HR remains 110s-120s. Gave PO Lopressor early.  Notified Dr. Zachery Conch. Will continue to monitor and awaiting any new orders.

## 2014-09-17 DIAGNOSIS — N289 Disorder of kidney and ureter, unspecified: Secondary | ICD-10-CM

## 2014-09-17 LAB — CBC
HEMATOCRIT: 33 % — AB (ref 36.0–46.0)
HEMATOCRIT: 31.4 % — AB (ref 36.0–46.0)
HEMOGLOBIN: 10.6 g/dL — AB (ref 12.0–15.0)
Hemoglobin: 10.1 g/dL — ABNORMAL LOW (ref 12.0–15.0)
MCH: 28.4 pg (ref 26.0–34.0)
MCH: 28.8 pg (ref 26.0–34.0)
MCHC: 32.1 g/dL (ref 30.0–36.0)
MCHC: 32.2 g/dL (ref 30.0–36.0)
MCV: 88.5 fL (ref 78.0–100.0)
MCV: 89.5 fL (ref 78.0–100.0)
PLATELETS: 272 10*3/uL (ref 150–400)
Platelets: 273 10*3/uL (ref 150–400)
RBC: 3.73 MIL/uL — ABNORMAL LOW (ref 3.87–5.11)
RBC: 3.51 MIL/uL — AB (ref 3.87–5.11)
RDW: 15.8 % — AB (ref 11.5–15.5)
RDW: 16 % — AB (ref 11.5–15.5)
WBC: 11.5 10*3/uL — ABNORMAL HIGH (ref 4.0–10.5)
WBC: 9.6 10*3/uL (ref 4.0–10.5)

## 2014-09-17 LAB — BASIC METABOLIC PANEL
Anion gap: 8 (ref 5–15)
BUN: 35 mg/dL — AB (ref 6–20)
CHLORIDE: 102 mmol/L (ref 101–111)
CO2: 26 mmol/L (ref 22–32)
CREATININE: 1.7 mg/dL — AB (ref 0.44–1.00)
Calcium: 8.8 mg/dL — ABNORMAL LOW (ref 8.9–10.3)
GFR calc Af Amer: 37 mL/min — ABNORMAL LOW (ref >60)
GFR calc non Af Amer: 32 mL/min — ABNORMAL LOW (ref >60)
Glucose, Bld: 104 mg/dL — ABNORMAL HIGH (ref 65–99)
POTASSIUM: 4.3 mmol/L (ref 3.5–5.1)
SODIUM: 136 mmol/L (ref 135–145)

## 2014-09-17 LAB — PROTIME-INR
INR: 2.06 — ABNORMAL HIGH (ref 0.00–1.49)
Prothrombin Time: 23.1 seconds — ABNORMAL HIGH (ref 11.6–15.2)

## 2014-09-17 MED ORDER — FUROSEMIDE 40 MG PO TABS
40.0000 mg | ORAL_TABLET | Freq: Every morning | ORAL | Status: DC
  Administered 2014-09-18: 40 mg via ORAL
  Filled 2014-09-17: qty 1

## 2014-09-17 MED ORDER — FUROSEMIDE 20 MG PO TABS
20.0000 mg | ORAL_TABLET | Freq: Every evening | ORAL | Status: DC
  Administered 2014-09-17: 20 mg via ORAL
  Filled 2014-09-17: qty 1

## 2014-09-17 MED ORDER — ENSURE ENLIVE PO LIQD
237.0000 mL | Freq: Three times a day (TID) | ORAL | Status: DC
  Administered 2014-09-17: 237 mL via ORAL

## 2014-09-17 MED ORDER — WARFARIN SODIUM 10 MG PO TABS
10.0000 mg | ORAL_TABLET | Freq: Once | ORAL | Status: AC
  Administered 2014-09-17: 10 mg via ORAL
  Filled 2014-09-17: qty 1

## 2014-09-17 NOTE — Progress Notes (Deleted)
Patient's HR dropped to 38, has been Sinus Brady in the 40s. Patient resting with no complaints at this time. Denies any dizziness or chest pain. Says his "heart rate is normally in the 40s." Notified K. Schorr and will continue to monitor.         

## 2014-09-17 NOTE — Progress Notes (Signed)
ANTICOAGULATION CONSULT NOTE - Follow Up Consult  Pharmacy Consult for Lovenox/Coumadin Indication: atrial clot and infaracted kidney  Allergies  Allergen Reactions  . Codeine Itching    Patient Measurements: Height:  (149.9 cm) Weight: 274 lb 6.4 oz (124.467 kg) IBW/kg (Calculated) : 43.2  Vital Signs: Temp: 97.9 F (36.6 C) (09/05 0531) Temp Source: Oral (09/05 0531) BP: 116/70 mmHg (09/05 0544)  Labs:  Recent Labs  09/14/14 1550 09/15/14 0258 09/16/14 0603 09/17/14 0312 09/17/14 0945  HGB 11.7* 11.5*  --   --  10.6*  HCT 35.8* 35.5*  --   --  33.0*  PLT 327 321  --   --  273  LABPROT  --  16.8* 18.7* 23.1*  --   INR  --  1.35 1.56* 2.06*  --   CREATININE  --  1.79*  --  1.70*  --     Estimated Creatinine Clearance: 43.1 mL/min (by C-G formula based on Cr of 1.7).  Assessment:  59yo female on anticoagulation for atrial clot and infarcted kidney. Plans for TEE in 4-6 weeks to evaluate LA thrombus and severity of AS.  Coumadin was started 9/1.  Dr. Lendell Caprice requested a change from Heparin to Lovenox for bridging.  Note she has some renal insufficiency from her kidney infarction that may put her at some risk for Lovenox accumulation.  However with a short course of Lovenox anticipated will proceed and monitor closely. INR today up to 2.06, this is the first day she has been therapeutic.  Probably needs 24-48 hours of overlap therapy with renal infarct and LA thrombus.  Goal of Therapy:  INR 2-3 Anti-Xa level 0.6-1 units/ml 4hrs after LMWH dose given Monitor platelets by anticoagulation protocol: Yes   Plan Lovenox  SQ q12h - discuss if appropriate to discontinue on 9/6 if INR remains >2 Coumadin  today, then a likely decrease Monitor renal function closely Check CBC at least q72h Monitor for bleeding complications Would consider antiXa monitoring for Lovenox if a prolonged course is needed Daily PT/INR  Estella Husk, Pharm.D., BCPS,  AAHIVP Clinical Pharmacist Phone: [phone] or [phone] 09/17/2014, 10:47 AM

## 2014-09-17 NOTE — Progress Notes (Signed)
SUBJECTIVE: Says "I feel so much better" with respect to breathing, pain, and leg swelling. Says she didn't realize how sick she was when she was first admitted.     Intake/Output Summary (Last 24 hours) at 09/17/14 0859 Last data filed at 09/17/14 0534  Gross per 24 hour  Intake    120 ml  Output   1400 ml  Net  -1280 ml    Current Facility-Administered Medications  Medication Dose Route Frequency Provider Last Rate Last Dose  . 0.9 %  sodium chloride infusion  250 mL Intravenous PRN Corky Crafts, MD      . acetaminophen (TYLENOL) tablet 650 mg  650 mg Oral Q4H PRN Corky Crafts, MD   650 mg at 09/16/14 2330  . benzonatate (TESSALON) capsule 200 mg  200 mg Oral TID PRN Hillary Bow, DO   200 mg at 09/05/14 2104  . diltiazem (CARDIZEM) tablet 60 mg  60 mg Oral 4 times per day Pricilla Riffle, MD   60 mg at 09/17/14 0531  . docusate sodium (COLACE) capsule 100 mg  100 mg Oral BID Renae Fickle, MD   100 mg at 09/17/14 0835  . enoxaparin (LOVENOX) injection 115 mg  1 mg/kg Subcutaneous Q12H Sallee Provencal, RPH   115 mg at 09/16/14 2330  . furosemide (LASIX) tablet 40 mg  40 mg Oral BID Cassell Clement, MD   40 mg at 09/17/14 2130  . lactulose (CHRONULAC) 10 GM/15ML solution 30 g  30 g Oral Daily PRN Renae Fickle, MD      . magnesium chloride (SLOW-MAG) 64 MG SR tablet 64 mg  1 tablet Oral BID Renae Fickle, MD   64 mg at 09/17/14 0831  . metoprolol (LOPRESSOR) tablet 50 mg  50 mg Oral BID Pricilla Riffle, MD   50 mg at 09/17/14 8657  . mometasone-formoterol (DULERA) 100-5 MCG/ACT inhaler 2 puff  2 puff Inhalation BID Hillary Bow, DO   2 puff at 09/17/14 [phone]  . morphine (MSIR) tablet 15-30 mg  15-30 mg Oral Q4H PRN Renae Fickle, MD   15 mg at 09/17/14 0545  . ondansetron (ZOFRAN) injection 4 mg  4 mg Intravenous Q6H PRN Corky Crafts, MD      . pantoprazole (PROTONIX) EC tablet 40 mg  40 mg Oral Daily Hillary Bow, DO   40 mg at 09/17/14 0835   . pneumococcal 23 valent vaccine (PNU-IMMUNE) injection 0.5 mL  0.5 mL Intramuscular Tomorrow-1000 Clydia Llano, MD   0.5 mL at 09/05/14 1106  . pravastatin (PRAVACHOL) tablet 40 mg  40 mg Oral q1800 Hillary Bow, DO   40 mg at 09/16/14 1704  . senna (SENOKOT) tablet 17.2 mg  2 tablet Oral QHS Renae Fickle, MD   17.2 mg at 09/15/14 2049  . sodium chloride 0.9 % injection 3 mL  3 mL Intravenous Q12H Hillary Bow, DO   3 mL at 09/17/14 0835  . sodium chloride 0.9 % injection 3 mL  3 mL Intravenous Q12H Corky Crafts, MD   3 mL at 09/16/14 2200  . sodium chloride 0.9 % injection 3 mL  3 mL Intravenous PRN Corky Crafts, MD      . warfarin (COUMADIN) video   Does not apply Once Silvana Newness, [facility]      . Warfarin - Pharmacist Dosing Inpatient   Does not apply q1800 Silvana Newness, [facility]  Filed Vitals:   09/16/14 2330 09/17/14 0531 09/17/14 0544 09/17/14 0835  BP: 99/59 90/65 116/70   Pulse:      Temp:  97.9 F (36.6 C)    TempSrc:  Oral    Resp:  20    Height:      Weight:  274 lb 6.4 oz (124.467 kg)    SpO2:  98%  98%    PHYSICAL EXAM General: NAD HEENT: Normal. Neck: +JVD Lungs: Faint bibasilar rales. CV: Distant heart tones, regular rate, irregular rhythm, normal S1/S2, no S3, no murmur.  1+ pitting pretibial edema.    Abdomen: Soft, obese, no distention.  Neurologic: Alert and oriented x 3.  Psych: Somewhat tearful. Musculoskeletal: No gross deformities. Extremities: No clubbing or cyanosis.   TELEMETRY: Reviewed telemetry pt in atrial fibrillation.  LABS: Basic Metabolic Panel:  Recent Labs  16/10/96 0258 09/17/14 0312  NA 134* 136  K 4.6 4.3  CL 100* 102  CO2 25 26  GLUCOSE 127* 104*  BUN 30* 35*  CREATININE 1.79* 1.70*  CALCIUM 8.8* 8.8*   Liver Function Tests: No results for input(s): AST, ALT, ALKPHOS, BILITOT, PROT, ALBUMIN in the last 72 hours. No results for input(s): LIPASE, AMYLASE in the last 72 hours. CBC:  Recent Labs   09/14/14 1550 09/15/14 0258  WBC 15.3* 16.6*  HGB 11.7* 11.5*  HCT 35.8* 35.5*  MCV 87.1 89.2  PLT 327 321   Cardiac Enzymes: No results for input(s): CKTOTAL, CKMB, CKMBINDEX, TROPONINI in the last 72 hours. BNP: Invalid input(s): POCBNP D-Dimer: No results for input(s): DDIMER in the last 72 hours. Hemoglobin A1C: No results for input(s): HGBA1C in the last 72 hours. Fasting Lipid Panel: No results for input(s): CHOL, HDL, LDLCALC, TRIG, CHOLHDL, LDLDIRECT in the last 72 hours. Thyroid Function Tests: No results for input(s): TSH, T4TOTAL, T3FREE, THYROIDAB in the last 72 hours.  Invalid input(s): FREET3 Anemia Panel: No results for input(s): VITAMINB12, FOLATE, FERRITIN, TIBC, IRON, RETICCTPCT in the last 72 hours.  RADIOLOGY: Dg Chest 2 View  08/28/2014   CLINICAL DATA:  Shortness of breath and chest pain.  Mild fever.  EXAM: CHEST  2 VIEW  COMPARISON:  May 19, 2014  FINDINGS: There is no edema or consolidation. Heart is upper normal in size with pulmonary vascularity within normal limits. No adenopathy. No pneumothorax. No bone lesions.  IMPRESSION: No edema or consolidation.   Electronically Signed   By: Bretta Bang III M.D.   On: 08/28/2014 09:47   Mr Brain Wo Contrast  09/07/2014   CLINICAL DATA:  Initial valuation for the increased gassiness, history of AFib, concern for stroke.  EXAM: MRI HEAD WITHOUT CONTRAST  TECHNIQUE: Multiplanar, multiecho pulse sequences of the brain and surrounding structures were obtained without intravenous contrast.  COMPARISON:  Prior study from 12/22/2004.  FINDINGS: No abnormal foci of restricted diffusion to suggest acute intracranial infarct. Gray-white matter differentiation maintained. Normal intravascular flow voids preserved. No acute or chronic intracranial hemorrhage.  Cerebral volume within normal limits for patient age. Minimal T2/FLAIR hyperintensity within the periventricular white matter, like related to chronic small vessel  ischemic disease, felt to be within normal limits for patient age. No mass lesion, midline shift, or mass effect. No hydrocephalus. No extra-axial fluid collection.  Craniocervical junction within normal limits. Incidental note made of a partially empty sella. Pituitary gland otherwise unremarkable.  No acute abnormality about the orbits.  Paranasal sinuses are clear. No mastoid effusion. Inner ear structures grossly normal.  Bone  marrow signal intensity normal. Scalp soft tissues within normal limits.  IMPRESSION: Negative brain MRI with no acute intracranial infarct or other process identified.   Electronically Signed   By: Rise Mu M.D.   On: 09/07/2014 04:43   Dg Chest Port 1 View  09/05/2014   CLINICAL DATA:  Follow-up of elevated white blood cell count  EXAM: PORTABLE CHEST - 1 VIEW  COMPARISON:  Chest x-ray of September 03, 2014  FINDINGS: The patient is positioned in a lordotic matter today which limits the apparent expansion of the lungs. The interstitial markings remain increased and there is subsegmental atelectasis at the right lung base. There is a small amount of fluid in the minor fissure. The cardiac silhouette remains enlarged. The pulmonary vascularity is more conspicuous today. The bony thorax exhibits no acute abnormality.  IMPRESSION: CHF with mild interstitial edema. Subsegmental atelectasis at the right lung base. When the patient can tolerate the procedure, a PA and lateral chest x-ray would be useful.   Electronically Signed   By: David  [place] M.D.   On: 09/05/2014 08:17   Dg Abd Acute W/chest  09/03/2014   CLINICAL DATA:  Congestion for 2 weeks. History of heart murmur. Vomiting.  EXAM: DG ABDOMEN ACUTE W/ 1V CHEST  COMPARISON:  08/30/2014 and 05/19/2014  FINDINGS: Interstitial markings are mildly prominent but similar to the previous examination. Heart size is slightly prominent but stable. The trachea is midline. There is no evidence to suggest free air. Gas in the  stomach. Small amount of gas and stool in the colon. Nonobstructive bowel gas pattern. Mild degenerative changes at the pubic symphysis.  IMPRESSION: Slightly prominent lung markings which are similar to the previous examinations. Difficult to exclude mild interstitial edema or vascular congestion.  Gaseous distention of the stomach.   Electronically Signed   By: Richarda Overlie M.D.   On: 09/03/2014 18:51   Ct Cta Abd/pel W/cm &/or W/o Cm  09/03/2014   CLINICAL DATA:  59 year old with left-sided abdominal pain for 2 weeks. New onset of atrial fibrillation.  EXAM: CT ANGIOGRAPHY ABDOMEN AND PELVIS  TECHNIQUE: Multidetector CT imaging of the abdomen and pelvis was performed using the standard protocol during bolus administration of intravenous contrast. Multiplanar reconstructed images including MIPs were obtained and reviewed to evaluate the vascular anatomy.  CONTRAST:  100 mL Omnipaque 350  COMPARISON:  Abdominal radiographs 09/03/2014  FINDINGS: ARTERIAL FINDINGS:  Aorta/Heart: Large filling defects in the posterior left atrium are most compatible with thrombus. The entire left atrium is not imaged. Right atrium appears to be enlarged. Cannot exclude thrombus in the right pulmonary veins. The abdominal aorta is patent without aneurysm or dissection.  Celiac axis: Celiac trunk and main branch vessels are patent, including the splenic artery.  Superior mesenteric: The superior mesenteric artery is patent. Main branch vessels are patent.  Left renal: Occlusion of the left renal artery just beyond the origin. Minimal blood flow to the left kidney even on the delayed images.  Right renal:         Right renal artery is patent.  Inferior mesenteric: Patent.  Left iliac: Left iliac arteries are patent. The proximal left femoral arteries are patent.  Right iliac: Right iliac arteries are patent. The proximal right femoral arteries are patent.  Venous findings: IVC and renal veins are patent. Portal venous system is patent.   Review of the MIP images confirms the above findings.  NONVASCULAR FINDINGS:  There is a small-moderate sized right pleural effusion. Few  patchy densities in the right middle lobe may represent atelectasis. No gross abnormality to the liver. There is a small amount of perihepatic ascites and fluid around the gallbladder. Suspect a small amount of periportal edema. No gross abnormality to the spleen or adrenal glands. No gross abnormality to the pancreas. Suspect scarring along the right kidney lower pole. The left kidney is edematous with minimal arterial flow. Findings compatible with acute vascular occlusion of the left renal artery with left kidney ischemia.  Some of the images are limited by motion artifact. No gross abnormality to the stomach or duodenum. Small amount of free fluid in the pelvis. Uterus is absent. No gross abnormality to the urinary bladder. No gross abnormality to the small or large bowel.  No acute bone abnormality.  IMPRESSION: Large filling defect in the left atrium is suggestive for thrombus. An underlying lesion cannot be excluded. This may be better characterized with an echocardiogram.  Occlusion of the proximal left renal artery with no significant flow to the left kidney. 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 Findings compatible with thromboembolic disease originating from the left atrium.  Small amount of fluid in the right abdomen and pelvis.  Critical Value/emergent results were called by telephone at the time of interpretation on 09/03/2014 at 8:46 pm to Dr. Benjiman Core , who verbally acknowledged these results.   Electronically Signed   By: Richarda Overlie M.D.   On: 09/03/2014 21:05      ASSESSMENT AND PLAN: 1. 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 CT surgery recommends 4 to 6 wks  warfarin for left atrial thrombus.   2. Atrial fibrillation: HR controlled on diltiazem and metoprolol.On warfarin, INR therapeutic today (2.06).  3 Acute diastolic heart failure:Symptomatically improved. On oral Lasix with approximately 1.5 L output in last 48 hours. BUN and creatinine gradually increasing, with overall stability of GFR. Will reduce evening Lasix dose to 20 mg.  4.Essential HTN: Stable today, low yesterday. No changes to diltiazem dose, necessary for rate control.   5.Renal infarct: Continue Lovenox and warfarin, INR 2.06 today.  Prentice Docker, M.D., F.A.C.C.

## 2014-09-17 NOTE — Progress Notes (Signed)
[facility] PROGRESS NOTE  [name] [name]:[id number] DOB: 1955-03-05 DOA: 09/03/2014 PCP: [name] Lek, MD  Brief Summary 59 year old female with history of asthma/pseudo-asthma admitted on 8/22 who presented to the emergency department with productive cough for 1.5 weeks despite being treated for bronchitis.  She was found to be in a-fib with RVR.  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  She was anticoagulated with heparin.  ECHO demonstrated severe mitral valve stenosis and moderate AV stenosis.  Her acute hypoxic respiratory failure, dyspnea, left atrial thrombus, atrial fibrillation, and moderate pulmonary hypertension are all probably secondary to her severe mitral stenosis.  Her course is also complicated by AKI secondary to a complete left renal infarction which is gradually resolving.  Most of her pre-surgery work up has been completed during this hospitalization.  She will undergo valve replacement surgery in 4-6 weeks.  Patient seen today. Her only complaint is of foot swelling bilaterally, which makes it painful to walk on. This is an acute process that is been occurring during this hospitalization. Denies any shortness of breath or chest pain.  INR therapeutic today.  Assessment/Plan  Severe mitral valve stenosis/mild to moderate aortic valve stenosis.  She also has severely reduced systolic function of the right ventricle and a moderately dilated RA, PA peak pressure of 64 mmHg suggesting moderate pulmonary hypertension. -  Cardiothoracic surgery recommends anticoagulation for at least 4-6 weeks prior to proceeding with surgery. Repeat TEE and possible amiodarone load prior to surgery Cardiac cath shows clean coronaries. Severe pulmonary hypertension. INR therapeutic as of 9/5.  A. fib with RVR, on Coumadin/lovenox bridge  Anemia: Patient  has had trend downward in her hemoglobin for the last few days, with soft blood pressure. Awaiting heme occult. Recheck labs in the morning  Pedal swelling: Suspect secondary to third spacing. Patient with poor protein calorie malnutrition. No signs of volume overload. Should improve as patient's nutrition improves  Large left atrial thrombus  Hypercoag panel negative except for mildly low protein C level and activity and mildly low antithrombin III which were likely due to acute thrombus.  Also drawn while on heparin gtt (Cardiolipin, homocysteine, beta2-glycoprotein, lupus ac, protein S activity and total, prothrombin, and factor V leiden all negative.) Repeat TEE prior to surgery.  Lovenox/coumadin per pharmacy  Acute hypoxic respiratory failure secondary to acute diastolic heart failure, improving Still fluid overloaded. On by mouth Lasix per cardiology.  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  Likely creatinine at new baseline.  Acute kidney injury, creatinine baseline of 0.9 and been trending upward last few days. We do ensure this is not from bleeding secondary to renal infarct, heart failure, and IV contrast. Monitor. Nephrology signed of  Hyponatremia, improving  bilateral lower extremity edema -  Venous duplex LE neg for DVT  Hyperkalemia due to AKI, resolved with IV fluids and Kayexalate  Pyuria, urine culture negative.  abx completed  Code Status: full Family Communication: Daughter at the bedside Disposition Plan:  INR therapeutic. Cleared by cardiology. Would favor observing for at least one more day ensuring that she's not having any bleeding due to anticoagulation  Consultants:  Cardiology, Dr. Royann Shivers  Vascular surgery by phone, Dr. Zachery Dakins  Nephrology, Dr. Arrie Aran  Cardiothoracic surgery, Dr. Barry Dienes  Procedures:  ECHO: Done 8/23 with preserved ejection fraction, but severe right sided  reduction  of systolic function  Cardiac catheterization done 9/1: No significant coronary artery disease but severe pulmonary artery hypertension and severe mitral stenosis  Carotid duplex:  1-39% stenosis bilaterally  Vascular duplex LE:  Neg for DVT  PFTs   Antibiotics:  Ceftriaxone 8/22 > 8/26  Objective: Filed Vitals:   09/17/14 0531 09/17/14 0544 09/17/14 0835 09/17/14 1459  BP: 90/65 116/70  97/64  Pulse:    73  Temp: 97.9 F (36.6 C)   97.5 F (36.4 C)  TempSrc: Oral   Oral  Resp: 20     Height:      Weight: 124.467 kg (274 lb 6.4 oz)     SpO2: 98%  98% 99%    Intake/Output Summary (Last 24 hours) at 09/17/14 1528 Last data filed at 09/17/14 1017  Gross per 24 hour  Intake      0 ml  Output   1850 ml  Net  -1850 ml   Filed Weights   09/15/14 0609 09/16/14 0521 09/17/14 0531  Weight: 114.363 kg (252 lb 2 oz) 114.488 kg (252 lb 6.4 oz) 124.467 kg (274 lb 6.4 oz)   Body mass index is 55.39 kg/(m^2).  Exam:   General:  Alert and oriented 3, fatigued  Cardiovascular:  Irregular rhythm, rate controlled  Respiratory:  Clear to auscultation bilaterally  Abdomen:   Soft, nontender, nondistended, hypoactive bowel sounds  Extremities: No clubbing or cyanosis, 1+ pitting edema  Data Reviewed: Basic Metabolic Panel:  Recent Labs Lab 09/11/14 0543 09/12/14 0408 09/13/14 0554 09/14/14 0515 09/15/14 0258 09/17/14 0312  NA 130* 132* 133* 134* 134* 136  K 4.4 4.6 4.6 5.0 4.6 4.3  CL 97* 97* 98* 101 100* 102  CO2 23 26 27 24 25 26   GLUCOSE 101* 96 100* 142* 127* 104*  BUN 29* 29* 30* 30* 30* 35*  CREATININE 1.50* 1.48* 1.55* 1.60* 1.79* 1.70*  CALCIUM 8.3* 8.5* 8.5* 8.7* 8.8* 8.8*  PHOS 2.3* 2.7 3.0 3.4  --   --    Liver Function Tests:  Recent Labs Lab 09/11/14 0543 09/12/14 0408 09/13/14 0554 09/14/14 0515  ALBUMIN 2.3* 2.3* 2.3* 2.4*   No results for input(s): LIPASE, AMYLASE in the last 168 hours. No results for input(s): AMMONIA in the last 168  hours. CBC:  Recent Labs Lab 09/12/14 1557 09/14/14 1550 09/15/14 0258 09/17/14 0945 09/17/14 1445  WBC 18.1* 15.3* 16.6* 11.5* 9.6  HGB 11.7* 11.7* 11.5* 10.6* 10.1*  HCT 35.9* 35.8* 35.5* 33.0* 31.4*  MCV 87.1 87.1 89.2 88.5 89.5  PLT 316 327 321 273 272    No results found for this or any previous visit (from the past 240 hour(s)).   Studies: No results found.  Scheduled Meds: . diltiazem  60 mg Oral 4 times per day  . docusate sodium  100 mg Oral BID  . enoxaparin (LOVENOX) injection  1 mg/kg Subcutaneous Q12H  . feeding supplement (ENSURE ENLIVE)  237 mL Oral TID BM  . furosemide  20 mg Oral QPM  . furosemide  40 mg Oral q morning - 10a  . magnesium chloride  1 tablet Oral BID  . metoprolol tartrate  50 mg Oral BID  . mometasone-formoterol  2 puff Inhalation BID  . pantoprazole  40 mg Oral Daily  . pneumococcal 23 valent vaccine  0.5 mL Intramuscular Tomorrow-1000  . pravastatin  40 mg Oral q1800  . senna  2 tablet Oral QHS  . sodium chloride  3 mL Intravenous Q12H  .  sodium chloride  3 mL Intravenous Q12H  . warfarin  10 mg Oral ONCE-1800  . warfarin   Does not apply Once  . Warfarin - Pharmacist Dosing Inpatient   Does not apply q1800   Continuous Infusions:    Principal Problem:   Atrial fibrillation with rapid ventricular response Active Problems:   Renal infarction   Hyperkalemia   Left atrial thrombus   Acute renal artery occlusion   Mitral valve stenosis and aortic valve stenosis   Rheumatic mitral stenosis   Rheumatic aortic stenosis   Chronic diastolic heart failure   Acute cystitis without hematuria   Lower extremity edema   Renal insufficiency   Mitral valve stenosis  Time spent: 25 minutes  KRISHNAN,SENDIL K  [facility] [email or web address], password [facility] 09/17/2014, 3:28 PM  LOS: 14 days

## 2014-09-17 NOTE — Progress Notes (Addendum)
Initial Nutrition Assessment  DOCUMENTATION CODES:   Morbid obesity  INTERVENTION:    Ensure Enlive PO TID, each supplement provides 350 kcal and 20 grams of protein  NUTRITION DIAGNOSIS:   Inadequate oral intake related to poor appetite as evidenced by meal completion < 50%.  GOAL:   Patient will meet greater than or equal to 90% of their needs  MONITOR:   PO intake, Supplement acceptance, Weight trends, Labs  REASON FOR ASSESSMENT:   Consult Poor PO  ASSESSMENT:   59yo female w/ PMH of mitral stenosis and aortic stenosis who was admitted on 09/03/14 for left arterial thrombus and thromboembolic occlusion of left renal artery and found to be in new onset atrial fibrillation w/ RVR.  Patient reports poor appetite for the past few weeks. She was eating soup PTA, but unable to keep it down. She feels that her appetite is improving. Currently consuming 25% of meals. Would like to try Ensure supplements to maximize protein and calorie intake. She has been trying to lose weight at home. Still with fluid overload. Receiving Lasix daily. Discussed ways to decrease sodium in her diet.  Diet Order:  Diet Heart Room service appropriate?: Yes; Fluid consistency:: Thin  Skin:  Reviewed, no issues  Last BM:  9/4  Height:   Ht Readings from Last 1 Encounters:  09/03/14  (1.499 m)    Weight:   Wt Readings from Last 1 Encounters:  09/17/14 274 lb 6.4 oz (124.467 kg)    Ideal Body Weight:  44.5 kg  BMI:  Body mass index is 55.39 kg/(m^2).  Estimated Nutritional Needs:   Kcal:  1850-2050  Protein:  100-120 gm  Fluid:  1.8 L  EDUCATION NEEDS:   Education needs addressed  Joaquin Courts, RD, LDN, CNSC Pager [phone] After Hours Pager [phone]

## 2014-09-17 NOTE — Progress Notes (Signed)
PT Cancellation Note  Patient Details Name: CHRISHAWNA FARINA MRN: [id number] DOB: [birth year]   Cancelled Treatment:    Reason Eval/Treat Not Completed: Patient declined reporting bil foot soreness and just recently started something for her foot pain.  Husband to bring slippers with cushion up tonight.  Pt agreeable for PT to check back on her tomorrow.  Thanks,    Rollene Rotunda. Shanieka Blea, PT, DPT [phone]   09/17/2014, 2:45 PM

## 2014-09-18 LAB — BASIC METABOLIC PANEL
ANION GAP: 7 (ref 5–15)
BUN: 33 mg/dL — AB (ref 6–20)
CHLORIDE: 102 mmol/L (ref 101–111)
CO2: 29 mmol/L (ref 22–32)
Calcium: 9.1 mg/dL (ref 8.9–10.3)
Creatinine, Ser: 1.6 mg/dL — ABNORMAL HIGH (ref 0.44–1.00)
GFR calc Af Amer: 40 mL/min — ABNORMAL LOW (ref >60)
GFR, EST NON AFRICAN AMERICAN: 34 mL/min — AB (ref >60)
GLUCOSE: 106 mg/dL — AB (ref 65–99)
POTASSIUM: 4.8 mmol/L (ref 3.5–5.1)
Sodium: 138 mmol/L (ref 135–145)

## 2014-09-18 LAB — PROTIME-INR
INR: 2.76 — ABNORMAL HIGH (ref 0.00–1.49)
Prothrombin Time: 28.7 seconds — ABNORMAL HIGH (ref 11.6–15.2)

## 2014-09-18 LAB — CBC
HEMATOCRIT: 33.3 % — AB (ref 36.0–46.0)
HEMOGLOBIN: 10.7 g/dL — AB (ref 12.0–15.0)
MCH: 28.7 pg (ref 26.0–34.0)
MCHC: 32.1 g/dL (ref 30.0–36.0)
MCV: 89.3 fL (ref 78.0–100.0)
Platelets: 281 10*3/uL (ref 150–400)
RBC: 3.73 MIL/uL — ABNORMAL LOW (ref 3.87–5.11)
RDW: 16 % — AB (ref 11.5–15.5)
WBC: 10.5 10*3/uL (ref 4.0–10.5)

## 2014-09-18 MED ORDER — BLOOD PRESSURE CUFF MISC: Status: DC

## 2014-09-18 MED ORDER — OFF THE BEAT BOOK
Freq: Once | Status: DC
  Filled 2014-09-18: qty 1

## 2014-09-18 MED ORDER — MORPHINE SULFATE 15 MG PO TABS: 15.0000 mg | ORAL_TABLET | ORAL | Status: AC | PRN

## 2014-09-18 MED ORDER — METOPROLOL TARTRATE 50 MG PO TABS: 50.0000 mg | ORAL_TABLET | Freq: Two times a day (BID) | ORAL | Status: DC

## 2014-09-18 MED ORDER — ENSURE ENLIVE PO LIQD: 237.0000 mL | Freq: Three times a day (TID) | ORAL | Status: AC

## 2014-09-18 MED ORDER — WARFARIN SODIUM 7.5 MG PO TABS: 7.5000 mg | ORAL_TABLET | Freq: Every day | ORAL | Status: AC

## 2014-09-18 MED ORDER — FUROSEMIDE 20 MG PO TABS: 20.0000 mg | ORAL_TABLET | Freq: Every evening | ORAL | Status: DC

## 2014-09-18 MED ORDER — DILTIAZEM HCL 60 MG PO TABS: 60.0000 mg | ORAL_TABLET | Freq: Four times a day (QID) | ORAL | Status: AC

## 2014-09-18 MED ORDER — FUROSEMIDE 40 MG PO TABS: 40.0000 mg | ORAL_TABLET | Freq: Every morning | ORAL | Status: DC

## 2014-09-18 MED ORDER — MAGNESIUM CHLORIDE 64 MG PO TBEC: 1.0000 | DELAYED_RELEASE_TABLET | Freq: Two times a day (BID) | ORAL | Status: AC

## 2014-09-18 MED ORDER — WARFARIN SODIUM 7.5 MG PO TABS: 7.5000 mg | ORAL_TABLET | Freq: Once | ORAL | Status: DC

## 2014-09-18 NOTE — Discharge Summary (Signed)
Physician Discharge Summary  [name] [name]:[id number] DOB: [birth year] DOA: 09/03/2014  PCP: Delorse Lek, MD  Admit date: 09/03/2014 Discharge date: 09/18/2014  Time spent: 35 minutes  Recommendations for Outpatient Follow-up:  Ultimately will need MV surgery, generally planned in 4-6 weeks. F/U with Dr. Cornelius Moras in [facility] Surgery clinic. Careful position changes since rate control meds are causing some orthostatic hypotension. 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 female with pmh of asthma / pseudoasthma who presents to the ED with c/o unchanged, productive cough onset 1.5 weeks ago. Has had associated symptoms of severe L sided abdominal, flank and back pain onset between 3 and 4 days ago that has been persistent since then. Per daughter she coughs to the point of gagging. Not able to eat anything but light / liquid foods. 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 Has h/o dust-induced bronchitis. Saw Dr. Sherene Sires a week ago who gave her meds for bronchitis and respiratory infection.  Hospital Course:  59 year old female with history of  asthma/pseudo-asthma admitted on 8/22 who presented to the emergency department with productive cough for 1.5 weeks despite being treated for bronchitis. She was found to be in a-fib with RVR. 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 She was anticoagulated with heparin. ECHO demonstrated severe mitral valve stenosis and moderate AV stenosis. Her acute hypoxic respiratory failure, dyspnea, left atrial thrombus, atrial fibrillation, and moderate pulmonary hypertension are all probably secondary to her severe mitral stenosis. Her course is also complicated by AKI secondary to a complete left renal infarction which is gradually resolving. Most of her pre-surgery work up has been completed during this hospitalization. She will undergo valve replacement surgery in 4-6 weeks. Severe mitral valve stenosis/mild to moderate aortic valve stenosis. She also has severely reduced systolic function of the right ventricle and a moderately dilated RA, PA peak pressure of 64 mmHg suggesting moderate pulmonary hypertension. - Cardiothoracic surgery recommends anticoagulation for at least 4-6 weeks prior to proceeding with surgery. Repeat TEE and possible amiodarone load prior to surgery Cardiac cath shows clean coronaries. Severe pulmonary hypertension. INR therapeutic as of 9/5.  A. fib with RVR, on Coumadin -cardizem and metoprolol  Anemia: Patient has had trend downward in her hemoglobin for the last few days, with soft blood pressure. Awaiting heme occult. Recheck labs in the morning  Pedal swelling: Suspect secondary to third spacing. Patient with poor protein calorie malnutrition. No signs of volume overload. Should improve as patient's nutrition improves  Large left atrial thrombus Hypercoag panel negative except for mildly low protein C level and activity and mildly low  antithrombin III which were likely due to acute thrombus. Also drawn while on heparin gtt (Cardiolipin, homocysteine, beta2-glycoprotein, lupus ac, protein S activity and total, prothrombin, and factor V leiden all negative.) Repeat TEE prior to surgery.coumadin per pharmacy  Acute hypoxic respiratory failure secondary to acute diastolic heart failure, improving Still fluid overloaded. On by mouth Lasix per cardiology. 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 Likely creatinine at new baseline.  Acute kidney injury, creatinine baseline of 0.9  -weekly BMP  Hyponatremia, improving  bilateral lower extremity edema - Venous duplex LE neg for DVT  Hyperkalemia due to AKI, resolved with IV fluids and Kayexalate  Pyuria, urine culture negative. abx completed  Procedures:  ECHO: Done 8/23 with preserved ejection fraction, but severe right sided reduction of systolic function  Cardiac catheterization done 9/1: No significant coronary artery disease but severe pulmonary artery hypertension and severe mitral stenosis  Carotid duplex: 1-39% stenosis bilaterally  Vascular duplex LE: Neg for DVT  PFTs  Consultations:  Cardiology, Dr. Royann Shivers  Vascular surgery by phone, Dr. Zachery Dakins  Nephrology, Dr. Arrie Aran  Cardiothoracic surgery, Dr. Barry Dienes  Discharge Exam: Filed Vitals:   09/18/14 1238  BP:   Pulse: 72  Temp:   Resp:     General: awake, chronically ill appearing   Discharge Instructions   Discharge Instructions    (HEART FAILURE PATIENTS) Call MD:  Anytime you have any of the following symptoms: 1) 3 pound weight gain in 24 hours or 5 pounds in 1 week 2) shortness of breath, with or without a dry hacking cough 3) swelling in the hands, feet or stomach 4) if you have to sleep on extra pillows at night in order to breathe.    Complete by:  As directed      Diet - low sodium heart healthy    Complete by:   As directed      Discharge instructions    Complete by:  As directed   PT/INR Thursday Cbc, BMP 1 week Home health     Increase activity slowly    Complete by:  As directed           Current Discharge Medication List    START taking these medications   Details  Blood Pressure Monitoring (BLOOD PRESSURE CUFF) MISC Automatic Qty: 1 each, Refills: 0    diltiazem (CARDIZEM) 60 MG tablet Take 1 tablet (60 mg total) by mouth every 6 (six) hours. Qty: 120 tablet, Refills: 0    feeding supplement, ENSURE ENLIVE, (ENSURE ENLIVE) LIQD Take 237 mLs by mouth 3 (three) times daily between meals. Qty: 237 mL, Refills: 12    !! furosemide (LASIX) 20 MG tablet Take 1 tablet (20 mg total) by mouth every evening. Qty: 30 tablet, Refills: 0    !! furosemide (LASIX) 40 MG tablet Take 1 tablet (40 mg total) by mouth every morning. Qty: 30 tablet, Refills: 0    magnesium chloride (SLOW-MAG) 64 MG TBEC SR tablet Take 1 tablet (64 mg total) by mouth 2 (two) times daily. Qty: 60 tablet, Refills: 0    metoprolol (LOPRESSOR) 50 MG tablet Take 1 tablet (50 mg total) by mouth 2 (two) times daily. Qty: 60 tablet, Refills: 0    morphine (MSIR) 15 MG tablet Take 1-2 tablets (15-30 mg total) by mouth every 4 (four) hours as needed  for moderate pain or severe pain. Qty: 30 tablet, Refills: 0    warfarin (COUMADIN) 7.5 MG tablet Take 1 tablet (7.5 mg total) by mouth daily at 6 PM. Qty: 30 tablet, Refills: 0     !! - Potential duplicate medications found. Please discuss with provider.    CONTINUE these medications which have NOT CHANGED   Details  b complex vitamins capsule Take 1 capsule by mouth daily.    benzonatate (TESSALON) 200 MG capsule Take 1 capsule (200 mg total) by mouth 3 (three) times daily as needed for cough. Qty: 30 capsule, Refills: 0    cholecalciferol (VITAMIN D) 1000 UNITS tablet Take 2,000 Units by mouth daily.     Coenzyme Q10 (COQ-10) 10 MG CAPS Take 1 capsule by mouth  daily.    mometasone-formoterol (DULERA) 100-5 MCG/ACT AERO Inhale 2 puffs into the lungs as needed for wheezing.    OMEGA-3 FATTY ACIDS PO Take 1,600 mg by mouth every Monday, Wednesday, and Friday.     pantoprazole (PROTONIX) 40 MG tablet Take 1 tablet (40 mg total) by mouth daily. Take 30-60 min before first meal of the day Qty: 30 tablet, Refills: 2   Associated Diagnoses: Upper airway cough syndrome    pravastatin (PRAVACHOL) 40 MG tablet Take 40 mg by mouth daily.       STOP taking these medications     predniSONE (DELTASONE) 20 MG tablet      Respiratory Therapy Supplies (FLUTTER) DEVI        Allergies  Allergen Reactions  . Codeine Itching   Follow-up Information    Follow up with COLADONATO,JOSEPH A, MD.   Specialty:  Nephrology   Why:  Office will call to make appointment   Contact information:   [address] [place] [address] [phone]       Follow up with [facility].   Specialty:  Home Health Services   Why:  Physical Therapy and Registered Nurse.    Contact information:   [address] [place] [address] [phone]       Follow up with [facility].   Why:  3n1  Bariatric if meets the weight requirement and RW along with shower seat.    Contact information:   [address] [place] [address] [phone]       Follow up with [facility] On 09/21/2014.   Why:  for coumadin check @ 10:30 am    Contact information:   [address] [place] [address]        The results of significant diagnostics from this hospitalization (including imaging, microbiology, ancillary and laboratory) are listed below for reference.    Significant Diagnostic Studies: Dg Chest 2 View  08/28/2014   CLINICAL DATA:  Shortness of breath and chest pain.  Mild fever.  EXAM: CHEST  2 VIEW  COMPARISON:  May 19, 2014  FINDINGS: There is no edema or consolidation.  Heart is upper normal in size with pulmonary vascularity within normal limits. No adenopathy. No pneumothorax. No bone lesions.  IMPRESSION: No edema or consolidation.   Electronically Signed   By: Bretta Bang III M.D.   On: 08/28/2014 09:47   Mr Brain Wo Contrast  09/07/2014   CLINICAL DATA:  Initial valuation for the increased gassiness, history of AFib, concern for stroke.  EXAM: MRI HEAD WITHOUT CONTRAST  TECHNIQUE: Multiplanar, multiecho pulse sequences of the  brain and surrounding structures were obtained without intravenous contrast.  COMPARISON:  Prior study from 12/22/2004.  FINDINGS: No abnormal foci of restricted diffusion to suggest acute intracranial infarct. Gray-white matter differentiation maintained. Normal intravascular flow voids preserved. No acute or chronic intracranial hemorrhage.  Cerebral volume within normal limits for patient age. Minimal T2/FLAIR hyperintensity within the periventricular white matter, like related to chronic small vessel ischemic disease, felt to be within normal limits for patient age. No mass lesion, midline shift, or mass effect. No hydrocephalus. No extra-axial fluid collection.  Craniocervical junction within normal limits. Incidental note made of a partially empty sella. Pituitary gland otherwise unremarkable.  No acute abnormality about the orbits.  Paranasal sinuses are clear. No mastoid effusion. Inner ear structures grossly normal.  Bone marrow signal intensity normal. Scalp soft tissues within normal limits.  IMPRESSION: Negative brain MRI with no acute intracranial infarct or other process identified.   Electronically Signed   By: Rise Mu M.D.   On: 09/07/2014 04:43   Dg Chest Port 1 View  09/05/2014   CLINICAL DATA:  Follow-up of elevated white blood cell count  EXAM: PORTABLE CHEST - 1 VIEW  COMPARISON:  Chest x-ray of September 03, 2014  FINDINGS: The patient is positioned in a lordotic matter today which limits the apparent expansion  of the lungs. The interstitial markings remain increased and there is subsegmental atelectasis at the right lung base. There is a small amount of fluid in the minor fissure. The cardiac silhouette remains enlarged. The pulmonary vascularity is more conspicuous today. The bony thorax exhibits no acute abnormality.  IMPRESSION: CHF with mild interstitial edema. Subsegmental atelectasis at the right lung base. When the patient can tolerate the procedure, a PA and lateral chest x-ray would be useful.   Electronically Signed   By: David  [place] M.D.   On: 09/05/2014 08:17   Dg Abd Acute W/chest  09/03/2014   CLINICAL DATA:  Congestion for 2 weeks. History of heart murmur. Vomiting.  EXAM: DG ABDOMEN ACUTE W/ 1V CHEST  COMPARISON:  08/30/2014 and 05/19/2014  FINDINGS: Interstitial markings are mildly prominent but similar to the previous examination. Heart size is slightly prominent but stable. The trachea is midline. There is no evidence to suggest free air. Gas in the stomach. Small amount of gas and stool in the colon. Nonobstructive bowel gas pattern. Mild degenerative changes at the pubic symphysis.  IMPRESSION: Slightly prominent lung markings which are similar to the previous examinations. Difficult to exclude mild interstitial edema or vascular congestion.  Gaseous distention of the stomach.   Electronically Signed   By: Richarda Overlie M.D.   On: 09/03/2014 18:51   Ct Cta Abd/pel W/cm &/or W/o Cm  09/03/2014   CLINICAL DATA:  59 year old with left-sided abdominal pain for 2 weeks. New onset of atrial fibrillation.  EXAM: CT ANGIOGRAPHY ABDOMEN AND PELVIS  TECHNIQUE: Multidetector CT imaging of the abdomen and pelvis was performed using the standard protocol during bolus administration of intravenous contrast. Multiplanar reconstructed images including MIPs were obtained and reviewed to evaluate the vascular anatomy.  CONTRAST:  100 mL Omnipaque 350  COMPARISON:  Abdominal radiographs 09/03/2014  FINDINGS:  ARTERIAL FINDINGS:  Aorta/Heart: Large filling defects in the posterior left atrium are most compatible with thrombus. The entire left atrium is not imaged. Right atrium appears to be enlarged. Cannot exclude thrombus in the right pulmonary veins. The abdominal aorta is patent without aneurysm or dissection.  Celiac axis: Celiac trunk and main branch vessels  are patent, including the splenic artery.  Superior mesenteric: The superior mesenteric artery is patent. Main branch vessels are patent.  Left renal: Occlusion of the left renal artery just beyond the origin. Minimal blood flow to the left kidney even on the delayed images.  Right renal:         Right renal artery is patent.  Inferior mesenteric: Patent.  Left iliac: Left iliac arteries are patent. The proximal left femoral arteries are patent.  Right iliac: Right iliac arteries are patent. The proximal right femoral arteries are patent.  Venous findings: IVC and renal veins are patent. Portal venous system is patent.  Review of the MIP images confirms the above findings.  NONVASCULAR FINDINGS:  There is a small-moderate sized right pleural effusion. Few patchy densities in the right middle lobe may represent atelectasis. No gross abnormality to the liver. There is a small amount of perihepatic ascites and fluid around the gallbladder. Suspect a small amount of periportal edema. No gross abnormality to the spleen or adrenal glands. No gross abnormality to the pancreas. Suspect scarring along the right kidney lower pole. The left kidney is edematous with minimal arterial flow. Findings compatible with acute vascular occlusion of the left renal artery with left kidney ischemia.  Some of the images are limited by motion artifact. No gross abnormality to the stomach or duodenum. Small amount of free fluid in the pelvis. Uterus is absent. No gross abnormality to the urinary bladder. No gross abnormality to the small or large bowel.  No acute bone abnormality.   IMPRESSION: Large filling defect in the left atrium is suggestive for thrombus. An underlying lesion cannot be excluded. This may be better characterized with an echocardiogram.  Occlusion of the proximal left renal artery with no significant flow to the left kidney. 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 Findings compatible with thromboembolic disease originating from the left atrium.  Small amount of fluid in the right abdomen and pelvis.  Critical Value/emergent results were called by telephone at the time of interpretation on 09/03/2014 at 8:46 pm to Dr. Benjiman Core , who verbally acknowledged these results.   Electronically Signed   By: Richarda Overlie M.D.   On: 09/03/2014 21:05    Microbiology: No results found for this or any previous visit (from the past 240 hour(s)).   Labs: Basic Metabolic Panel:  Recent Labs Lab 09/12/14 0408 09/13/14 0554 09/14/14 0515 09/15/14 0258 09/17/14 0312 09/18/14 0514  NA 132* 133* 134* 134* 136 138  K 4.6 4.6 5.0 4.6 4.3 4.8  CL 97* 98* 101 100* 102 102  CO2 26 27 24 25 26 29   GLUCOSE 96 100* 142* 127* 104* 106*  BUN 29* 30* 30* 30* 35* 33*  CREATININE 1.48* 1.55* 1.60* 1.79* 1.70* 1.60*  CALCIUM 8.5* 8.5* 8.7* 8.8* 8.8* 9.1  PHOS 2.7 3.0 3.4  --   --   --    Liver Function Tests:  Recent Labs Lab 09/12/14 0408 09/13/14 0554 09/14/14 0515  ALBUMIN 2.3* 2.3* 2.4*   No results for input(s): LIPASE, AMYLASE in the last 168 hours. No results for input(s): AMMONIA in the last 168 hours. CBC:  Recent Labs Lab 09/14/14 1550 09/15/14 0258 09/17/14 0945 09/17/14 1445 09/18/14 0514  WBC 15.3* 16.6* 11.5* 9.6 10.5  HGB 11.7* 11.5* 10.6* 10.1* 10.7*  HCT 35.8* 35.5* 33.0* 31.4* 33.3*  MCV 87.1 89.2 88.5 89.5 89.3  PLT 327 321 273 272 281   Cardiac Enzymes:  No results for input(s): CKTOTAL, CKMB, CKMBINDEX, TROPONINI in the last 168 hours. BNP: BNP (last 3 results) No results for input(s):  BNP in the last 8760 hours.  ProBNP (last 3 results) No results for input(s): PROBNP in the last 8760 hours.  CBG: No results for input(s): GLUCAP in the last 168 hours.     SignedMarlin Canary  [facility] 09/18/2014, 1:35 PM

## 2014-09-18 NOTE — Progress Notes (Signed)
ANTICOAGULATION CONSULT NOTE - Follow Up Consult  Pharmacy Consult for Lovenox/Coumadin Indication: atrial clot and infaracted kidney  Allergies  Allergen Reactions  . Codeine Itching    Patient Measurements: Height:  (149.9 cm) Weight: 255 lb 9.6 oz (115.939 kg) IBW/kg (Calculated) : 43.2  Vital Signs: Temp: 97.7 F (36.5 C) (09/06 0518) Temp Source: Oral (09/06 0518) BP: 93/61 mmHg (09/06 0827) Pulse Rate: 84 (09/06 0827)  Labs:  Recent Labs  09/16/14 0603 09/17/14 0312  09/17/14 0945 09/17/14 1445 09/18/14 0514  HGB  --   --   < > 10.6* 10.1* 10.7*  HCT  --   --   --  33.0* 31.4* 33.3*  PLT  --   --   --  273 272 281  LABPROT 18.7* 23.1*  --   --   --  28.7*  INR 1.56* 2.06*  --   --   --  2.76*  CREATININE  --  1.70*  --   --   --  1.60*  < > = values in this interval not displayed.  Estimated Creatinine Clearance: 43.7 mL/min (by C-G formula based on Cr of 1.6).  Assessment:  59 yo female on anticoagulation for atrial clot and infarcted kidney. Plans for TEE in 4-6 weeks to evaluate LA thrombus and severity of AS.  Coumadin was started 9/1. Pt has completed 5 days of IV>PO overlap therapy for VTE treatment and has had a therapeutic INR x 2.  Will discontinue Lovenox.   INR today up to 2.76, quite a large jump in INR.  Will reduce Coumadin dose.  Goal of Therapy:  INR 2-3 Monitor platelets by anticoagulation protocol: Yes   Plan D/C Lovenox. Coumadin 7.5 mg today  Monitor renal function closely Monitor for bleeding complications Daily PT/INR  [employer], Pharm.D., BCPS Clinical Pharmacist Pager [phone] 09/18/2014 11:11 AM

## 2014-09-18 NOTE — Progress Notes (Signed)
Occupational Therapy Treatment Patient Details Name: [name] [name] MRN: [id number] DOB: Mar 16, 1955 Today's Date: 09/18/2014    History of present illness   [name] [name] is a 59 y.o. female with past medical history significant for asthma, hypercholesterolemia, valvular disease and obesity. She presented to the emergency department on 8/22 with complaints of a productive cough and also severe left sided abdominal flank and back pain for 3 days. She was noted to be in atrial fibrillation at the time and that was new for her. She underwent a CT of the abdomen with contrast and that was notable for an atrial thrombus and what was felt to be an acute occlusion of the left renal artery with minimal to no flow to the left kidney.   During hospitalization her HR has been difficult to control with symptoms felt to be related to severe mitral stenosis.  It is felt that she will ultimately need a mitral valve repair/replacement.      OT comments  Pt planning to d/c home today-updated d/c recommendation to [facility]OT.   Follow Up Recommendations  Home health OT;Supervision/Assistance - 24 hour    Equipment Recommendations  3 in 1 bedside comode;Tub/shower bench    Recommendations for Other Services      Precautions / Restrictions Precautions Precautions: Other (comment) Precaution Comments: monitor HR Restrictions Weight Bearing Restrictions: No       Mobility Bed Mobility               General bed mobility comments: up in recliner  Transfers Overall transfer level: Needs assistance Equipment used: Rolling walker (2 wheeled) Transfers: Sit to/from Stand Sit to Stand: Supervision;Min guard         General transfer comment: used RW for support upon standing    Balance Used RW for ambulation and for support upon standing.                 ADL Overall ADL's : Needs assistance/impaired     Grooming: Oral care;Wash/dry face;Applying deodorant;Set  up;Supervision/safety;Standing;Sitting (applied lotion; dryed hands)   Upper Body Bathing: Set up;Supervision/ safety;Standing Upper Body Bathing Details (indicate cue type and reason): washed armpits Lower Body Bathing: Moderate assistance Lower Body Bathing Details (indicate cue type and reason): OT washed bottom and pt washed off peri area after urinating Upper Body Dressing : Minimal assistance;Sitting Upper Body Dressing Details (indicate cue type and reason): donning back gown     Toilet Transfer: Min guard;Ambulation;RW;BSC   Toileting- Clothing Manipulation and Hygiene: Set up;Supervision/safety;Sit to/from stand       Functional mobility during ADLs: Min guard;Rolling walker General ADL Comments: Educated on energy conservation. Educated on safety. Cues for deep breathing technique. Educated on AE. Suggested a pulse oximeter to help monitor pt.      Vision                     Perception     Praxis      Cognition  Awake/Alert Behavior During Therapy: Flat affect  Overall Cognitive Status: Within Functional Limits for tasks assessed                       Extremity/Trunk Assessment               Exercises     Shoulder Instructions       General Comments      Pertinent Vitals/ Pain       Pain Assessment: 0-10 Pain Score: 8  Pain Location: bilateral feet Pain Descriptors / Indicators: Throbbing;Sore Pain Intervention(s): Monitored during session;Repositioned  Home Living                                          Prior Functioning/Environment              Frequency Min 2X/week     Progress Toward Goals  OT Goals(current goals can now be found in the care plan section)  Progress towards OT goals: Progressing toward goals  Acute Rehab OT Goals Patient Stated Goal: not stated OT Goal Formulation: With patient Time For Goal Achievement: 09/20/14 Potential to Achieve Goals: Good  Plan Discharge plan needs  to be updated    Co-evaluation                 End of Session Equipment Utilized During Treatment: Gait belt;Rolling walker   Activity Tolerance Patient limited by fatigue   Patient Left in chair;with family/visitor present   Nurse Communication Other (comment) (HR/spilled urine)        Time: 1610-9604 (time spent cleaning up urine as OT spilled bucket of urine in room as well time to go get towels/washcloths) OT Time Calculation (min): 30 min  Charges: OT General Charges $OT Visit: 1 Procedure OT Treatments $Self Care/Home Management : 8-22 mins  Taavi Hoose L 09/18/2014, 1:41 PM

## 2014-09-18 NOTE — Progress Notes (Signed)
Pt for discharge and be back for [facility]- AVR. Pt cardiac surgery teaching gone over by Staff RN . Incentive spirometry, Seen video on [facility] video 112 and 113. Cardiac surgery booklet given, DBE, Wound splinting coughing exercises gone over with pt. Wound care and infection control gone over with pt as well as ambulation and proper use of pain medication to positive progress pt post [facility].

## 2014-09-18 NOTE — Progress Notes (Signed)
CARDIAC REHAB PHASE I   Pt just finished ambulating in hallway with PT, states she is hoping to discharge home today. Completed CHF/pre-op education with pt daughter at bedside. Reviewed IS, sternal precautions, activity progression, exercise, risk factors, CHF booklet and zone tool, daily weights, diet including heart healthy diet and sodium and fluid restrictions. Gave pt cardiac surgery booklet and OHS guidelines. Pt verbalized understanding. Pt is not a candidate for phase 2 cardiac rehab at this time. EF 50-55%, awaiting valve repair. Will plan to refer after surgery is complete.  Gave pt info to watch CHF, cardiac surgery videos, requested "off the beat" book from RN. Pt in recliner, call bell within reach.   [phone]  Joylene Grapes, RN, BSN 09/18/2014 11:46 AM

## 2014-09-18 NOTE — Progress Notes (Addendum)
Physical Therapy Treatment Patient Details Name: [name] [name] MRN: [id number] DOB: 05-Apr-1955 Today's Date: 09/18/2014    History of Present Illness   [name] [name] is a 59 y.o. female with past medical history significant for asthma, hypercholesterolemia, valvular disease and obesity. She presented to the emergency department on 8/22 with complaints of a productive cough and also severe left sided abdominal flank and back pain for 3 days. She was noted to be in atrial fibrillation at the time and that was new for her. She underwent a CT of the abdomen with contrast and that was notable for an atrial thrombus and what was felt to be an acute occlusion of the left renal artery with minimal to no flow to the left kidney.   During hospitalization her HR has been difficult to control with symptoms felt to be related to severe mitral stenosis.  It is felt that she will ultimately need a mitral valve repair/replacement.       PT Comments    Pt continues to be limited (multiple rest breaks) by high HR during mobility.  She can physically tell when this happens and we take seated rest breaks throughout the session until HR comes down.  She was able to demonstrate the strength to get into her home today.  PT will continue to follow acutely until d/c.    Follow Up Recommendations  Home health PT;Supervision - Intermittent     Equipment Recommendations  Rolling walker with 5" wheels;3in1 (PT) (wide/bariatric)    Recommendations for Other Services   NA     Precautions / Restrictions Precautions Precautions: Other (comment) Precaution Comments: monitor HR Restrictions Weight Bearing Restrictions: No    Mobility  Bed Mobility               General bed mobility comments: pt is up in the chair  Transfers Overall transfer level: Needs assistance Equipment used: Rolling walker (2 wheeled) Transfers: Sit to/from Stand Sit to Stand: Supervision         General transfer comment:  Supervision for safety due to heavy reliance on upper extremity support and momentum (several rocks back and forth) as well as multiple attempts to get to standing successfully.   Ambulation/Gait Ambulation/Gait assistance: Supervision Ambulation Distance (Feet): 75 Feet (with one seated rest break) Assistive device: Rolling walker (2 wheeled) Gait Pattern/deviations: Step-through pattern;Wide base of support Gait velocity: decreased Gait velocity interpretation: Below normal speed for age/gender General Gait Details: Pt with more limited gait distance today due to having done stairs first and continued high HR with mobility.  After ~1-2 min rest break HR comes down from 140s-150s back to 90s at rest.      09/18/14 1723  Ambulation/Gait  Stairs Yes  Stairs assistance Min guard  Stair Management Two rails;Step to pattern;Forwards (forwards up backwards down)  Number of Stairs 2  General stair comments Pt with significant effort to go up and down two steps, but she was able with limited assistance using both handrails and relying heavily on her arms for support.           Balance Overall balance assessment: Needs assistance         Standing balance support: Bilateral upper extremity supported Standing balance-Leahy Scale: Poor                      Cognition Arousal/Alertness: Awake/alert Behavior During Therapy: WFL for tasks assessed/performed Overall Cognitive Status: Within Functional Limits for tasks assessed  General Comments General comments (skin integrity, edema, etc.): Discussed at length energy conservation techniques at home and letting other people help her when they offer.       Pertinent Vitals/Pain Pain Assessment: 0-10 Pain Score: 6  Pain Location: bil feet Pain Descriptors / Indicators: Aching;Burning;Tightness Pain Intervention(s): Limited activity within patient's tolerance;Monitored during session;Repositioned            PT Goals (current goals can now be found in the care plan section) Acute Rehab PT Goals Patient Stated Goal: to get back to independence. Progress towards PT goals: Progressing toward goals    Frequency  Min 3X/week    PT Plan Current plan remains appropriate       End of Session   Activity Tolerance: Patient limited by fatigue;Treatment limited secondary to medical complications (Comment) (limited by high HR when ambulating) Patient left: in chair;with call bell/phone within reach;with family/visitor present     Time: 1019-1100 PT Time Calculation (min) (ACUTE ONLY): 41 min  Charges:  $Gait Training: 23-37 mins $Self Care/Home Management: 8-22                      [name] [name], PT, DPT [phone]   09/18/2014, 12:24 PM

## 2014-09-18 NOTE — Progress Notes (Signed)
Patient Profile: 59 y/o female w/ a PMH of mitral and aortic valve stenosis, admitted 09/03/14 for dyspnea + cough, found to be in new onset Afib w/ RVR.  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 With subsequent AKI which is improving. 2D echo demonstrated severe mitral stenosis and moderate aortic stenosis. EF normal at 50-55%. LHC showed no significant coronary disease with only mild AS by cath. Seen by Dr. Cornelius Moras. Plan is to continue anticoagulation for thrombus and plan for valve replacement surgery in 4-6 weeks.   Subjective: No complaints. Feels better. Denies any dyspnea resting. Has been sleeping in recliner.    Objective: Vital signs in last 24 hours: Temp:  [97.5 F (36.4 C)-97.8 F (36.6 C)] 97.7 F (36.5 C) (09/06 0518) Pulse Rate:  [73-95] 87 (09/06 0518) BP: (93-97)/(58-64) 94/58 mmHg (09/06 0518) SpO2:  [96 %-99 %] 96 % (09/06 0518) Weight:  [255 lb 9.6 oz (115.939 kg)] 255 lb 9.6 oz (115.939 kg) (09/06 0518) Last BM Date: 09/16/14  Intake/Output from previous day: 09/05 0701 - 09/06 0700 In: 360 [P.O.:360] Out: 1150 [Urine:1150] Intake/Output this shift:    Medications Current Facility-Administered Medications  Medication Dose Route Frequency Provider Last Rate Last Dose  . 0.9 %  sodium chloride infusion  250 mL Intravenous PRN Corky Crafts, MD      . acetaminophen (TYLENOL) tablet 650 mg  650 mg Oral Q4H PRN Corky Crafts, MD   650 mg at 09/17/14 1319  . benzonatate (TESSALON) capsule 200 mg  200 mg Oral TID PRN Hillary Bow, DO   200 mg at 09/05/14 2104  . diltiazem (CARDIZEM) tablet 60 mg  60 mg Oral 4 times per day Pricilla Riffle, MD   60 mg at 09/18/14 0536  . docusate sodium (COLACE) capsule 100 mg  100 mg Oral BID Renae Fickle, MD   100 mg at 09/17/14 2212  . enoxaparin (LOVENOX) injection 115 mg  1 mg/kg  Subcutaneous Q12H Sallee Provencal, RPH   115 mg at 09/17/14 2212  . feeding supplement (ENSURE ENLIVE) (ENSURE ENLIVE) liquid 237 mL  237 mL Oral TID BM Idell Pickles, RD   237 mL at 09/17/14 1319  . furosemide (LASIX) tablet 20 mg  20 mg Oral QPM Laqueta Linden, MD   20 mg at 09/17/14 1723  . furosemide (LASIX) tablet 40 mg  40 mg Oral q morning - 10a Laqueta Linden, MD   0 mg at 09/17/14 0913  . lactulose (CHRONULAC) 10 GM/15ML solution 30 g  30 g Oral Daily PRN Renae Fickle, MD      . magnesium chloride (SLOW-MAG) 64 MG SR tablet 64 mg  1 tablet Oral BID Renae Fickle, MD   64 mg at 09/17/14 2212  . metoprolol (LOPRESSOR) tablet 50 mg  50 mg Oral BID Pricilla Riffle, MD   50 mg at 09/17/14 6314  . mometasone-formoterol (DULERA) 100-5 MCG/ACT inhaler 2 puff  2 puff Inhalation BID Hillary Bow, DO   2 puff at 09/17/14 [phone]  . morphine (MSIR) tablet 15-30 mg  15-30 mg Oral Q4H PRN Renae Fickle, MD   15 mg at 09/17/14 1552  . ondansetron (ZOFRAN) injection 4 mg  4 mg Intravenous Q6H PRN Corky Crafts, MD      . pantoprazole (PROTONIX) EC tablet 40 mg  40  mg Oral Daily Hillary Bow, DO   40 mg at 09/17/14 1610  . pneumococcal 23 valent vaccine (PNU-IMMUNE) injection 0.5 mL  0.5 mL Intramuscular Tomorrow-1000 Clydia Llano, MD   0.5 mL at 09/05/14 1106  . pravastatin (PRAVACHOL) tablet 40 mg  40 mg Oral q1800 Hillary Bow, DO   40 mg at 09/17/14 1724  . senna (SENOKOT) tablet 17.2 mg  2 tablet Oral QHS Renae Fickle, MD   17.2 mg at 09/17/14 2212  . sodium chloride 0.9 % injection 3 mL  3 mL Intravenous Q12H Hillary Bow, DO   3 mL at 09/17/14 2214  . sodium chloride 0.9 % injection 3 mL  3 mL Intravenous Q12H Corky Crafts, MD   3 mL at 09/16/14 2200  . sodium chloride 0.9 % injection 3 mL  3 mL Intravenous PRN Corky Crafts, MD      . warfarin (COUMADIN) video   Does not apply Once Silvana Newness, [facility]      . Warfarin - Pharmacist Dosing Inpatient    Does not apply q1800 Silvana Newness, [facility]        PE: General appearance: alert, cooperative, no distress and moderately obese Neck: no carotid bruit and + JVD at level of the ear Lungs: clear to auscultation bilaterally Heart: irregularly irregular rhythm and regular rate + murmur Extremities: 2 + bilateral LEE Pulses: 2+ and symmetric Skin: warm and dry Neurologic: Grossly normal  Lab Results:   Recent Labs  09/17/14 0945 09/17/14 1445 09/18/14 0514  WBC 11.5* 9.6 10.5  HGB 10.6* 10.1* 10.7*  HCT 33.0* 31.4* 33.3*  PLT 273 272 281   BMET  Recent Labs  09/17/14 0312 09/18/14 0514  NA 136 138  K 4.3 4.8  CL 102 102  CO2 26 29  GLUCOSE 104* 106*  BUN 35* 33*  CREATININE 1.70* 1.60*  CALCIUM 8.8* 9.1   PT/INR  Recent Labs  09/16/14 0603 09/17/14 0312 09/18/14 0514  LABPROT 18.7* 23.1* 28.7*  INR 1.56* 2.06* 2.76*     Assessment/Plan  Principal Problem:   Atrial fibrillation with rapid ventricular response Active Problems:   Renal infarction   Hyperkalemia   Left atrial thrombus   Acute renal artery occlusion   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 Rheumatic Heart Disease: severe mitral stenosis and mild aortic stenosis by cath. CT surgery recommends 4-6 weeks of warfarin for left atrial thrombus followed by valve surgery.   2. Atrial Fibrillation w/ RVR: rate is controlled. She is asymptomatic. Continue Cardizem and metoprolol for rate. Continue Warfarin.   3. Acute Diastolic CHF: EF 96-04%. Still volume overloaded but no respiratory distress. Continue Lasix. She is currently on PO. May consider IV now that renal function has improved.   4. Essential HTN: soft but stable.   5. Atrial Thrombus: INR therapeutic at 2.06. Continue Warfarin.    6. Renal Infarct: 2/2 problem # 5. On anticoagulation with Warfarin. Scr improving down from 2.38. Now at 1.60.      LOS: 15 days    Brittainy M. Sharol Harness, PA-C 09/18/2014 8:05 AM  I have seen and examined the patient along with Brittainy M. Sharol Harness, PA-C.  I have reviewed the chart, notes and new data.  I  agree with PA's note.  Key new complaints: occasionally dizzy with orthostasis Key examination changes: irregular rhythm, rate well controlled, diastolic rumble, persistent LVD and 2-3+ pretibial edema Key new findings / data: INR now >2  PLAN: When ready for DC will arrange f/u with Dr. Patty Sermons and coumadin clinic. Ultimately will need MV surgery, generally planned in 4-6 weeks. F/U with Dr. Cornelius Moras in CV Surgery clinic. Discussed precautions with warfarin, dietaryy interactions and need for monitoring.  Careful position changes since rate control meds are causing some orthostatic hypotension.  Thurmon Fair, MD, [facility] [phone] 09/18/2014, 10:46 AM

## 2014-09-20 ENCOUNTER — Telehealth: Payer: Self-pay | Admitting: Cardiology

## 2014-09-20 NOTE — Telephone Encounter (Signed)
Verbal ok given for requested as well as OT and PT to evaluate and treat  Ok per  Dr. Patty Sermons

## 2014-09-20 NOTE — Telephone Encounter (Signed)
New message     Need order for visit for 2 times a week for 8 weeks for medication management, weight management and cardiac assessment Fax # is [phone]

## 2014-09-21 ENCOUNTER — Ambulatory Visit (INDEPENDENT_AMBULATORY_CARE_PROVIDER_SITE_OTHER): Payer: Commercial Managed Care - HMO | Admitting: *Deleted

## 2014-09-21 ENCOUNTER — Telehealth: Payer: Self-pay | Admitting: Cardiology

## 2014-09-21 ENCOUNTER — Telehealth: Payer: Self-pay | Admitting: *Deleted

## 2014-09-21 DIAGNOSIS — I213 ST elevation (STEMI) myocardial infarction of unspecified site: Secondary | ICD-10-CM | POA: Diagnosis not present

## 2014-09-21 DIAGNOSIS — I513 Intracardiac thrombosis, not elsewhere classified: Secondary | ICD-10-CM

## 2014-09-21 DIAGNOSIS — I4891 Unspecified atrial fibrillation: Secondary | ICD-10-CM

## 2014-09-21 LAB — PROTIME-INR
INR: 9.9 ratio — AB (ref 0.8–1.0)
PROTHROMBIN TIME: 102.8 s — AB (ref 9.6–13.1)

## 2014-09-21 LAB — POCT INR: INR: 8

## 2014-09-21 NOTE — Telephone Encounter (Signed)
[name] from [facility] called with critical lab result.  PT 102.8, INR 9.9.  Result taken to Coumadin Clinic.  Spoke with Megan reguarding result.  Patient had been seen in the Coumadin Clinic this am.

## 2014-09-21 NOTE — Telephone Encounter (Signed)
Verbal order given to Riza

## 2014-09-21 NOTE — Telephone Encounter (Signed)
New message     Needs order for physical therapy for 2 times a week for 6 weeks

## 2014-09-24 ENCOUNTER — Ambulatory Visit (INDEPENDENT_AMBULATORY_CARE_PROVIDER_SITE_OTHER): Payer: Commercial Managed Care - HMO | Admitting: *Deleted

## 2014-09-24 DIAGNOSIS — I213 ST elevation (STEMI) myocardial infarction of unspecified site: Secondary | ICD-10-CM

## 2014-09-24 DIAGNOSIS — I4891 Unspecified atrial fibrillation: Secondary | ICD-10-CM

## 2014-09-24 DIAGNOSIS — I513 Intracardiac thrombosis, not elsewhere classified: Secondary | ICD-10-CM

## 2014-09-24 LAB — POCT INR: INR: 5.3

## 2014-09-27 ENCOUNTER — Encounter: Payer: Self-pay | Admitting: Cardiology

## 2014-10-02 ENCOUNTER — Encounter: Payer: Self-pay | Admitting: Nurse Practitioner

## 2014-10-02 ENCOUNTER — Telehealth: Payer: Self-pay | Admitting: Cardiology

## 2014-10-02 ENCOUNTER — Ambulatory Visit (INDEPENDENT_AMBULATORY_CARE_PROVIDER_SITE_OTHER): Payer: Commercial Managed Care - HMO | Admitting: Nurse Practitioner

## 2014-10-02 ENCOUNTER — Ambulatory Visit (INDEPENDENT_AMBULATORY_CARE_PROVIDER_SITE_OTHER): Payer: Commercial Managed Care - HMO

## 2014-10-02 VITALS — BP 100/68 | HR 79 | Ht 59.0 in | Wt 262.8 lb

## 2014-10-02 DIAGNOSIS — I4891 Unspecified atrial fibrillation: Secondary | ICD-10-CM

## 2014-10-02 DIAGNOSIS — I513 Intracardiac thrombosis, not elsewhere classified: Secondary | ICD-10-CM

## 2014-10-02 DIAGNOSIS — I050 Rheumatic mitral stenosis: Secondary | ICD-10-CM | POA: Diagnosis not present

## 2014-10-02 DIAGNOSIS — I213 ST elevation (STEMI) myocardial infarction of unspecified site: Secondary | ICD-10-CM

## 2014-10-02 LAB — POCT INR: INR: 1.8

## 2014-10-02 MED ORDER — FUROSEMIDE 40 MG PO TABS: ORAL_TABLET | ORAL | Status: AC

## 2014-10-02 NOTE — Progress Notes (Signed)
CARDIOLOGY OFFICE NOTE  Date:  10/02/2014    [name] [name] Date of Birth: 01-Jul-1955 Medical Record #[id number]   PCP:  Delorse Lek, MD  Cardiologist:  Patty Sermons   Chief Complaint  Patient presents with  . Cardiac Valve Problem    Post hospital visit - seen for Dr. Patty Sermons    History of Present Illness: [name] [name] is a 59 y.o. female who presents today for a post hospital visit. Seen for Dr. Patty Sermons.   She has a history of asthma / pseudoasthma and known valvular heart disease/rheumatic heart disease. Last seen in the office back in May by Dr. Patty Sermons.   She presented to the ED earlier this month with cough, abdominal pain and shortness of breath/respiratory failure. Found to be in AF with RVR. 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 She was anticoagulated with heparin. ECHO demonstrated severe mitral valve stenosis and moderate AV stenosis. Her acute hypoxic respiratory failure, dyspnea, left atrial thrombus, atrial fibrillation, and moderate pulmonary hypertension are all probably secondary to her severe mitral stenosis. Her course was also complicated by AKI secondary to a complete left renal infarction which gradually resolved. Most of her pre-surgery work up has been completed during this hospitalization. Plan is for her to undergo valve replacement surgery in 4-6 weeks after being anticoagulated for at least 4 to 6 weeks with possible TEE and amiodarone loading.   Since discharge, there has been lability in her INR - has been up to 9. Saw nephrology this past Friday - had labs - basically unchanged.   Comes in today. Here with her daughter. Says she is here because of a "cough and trouble sleeping". She will have more cough especially if she lies back. She is sleeping more upright. Weight is climbing. She has no chest  pain. Short of breath with activities but says "not too bad". She does not feel like she has regressed since discharge. She does have more swelling however. Poor appetite - early satiety. Abdominal bloating. Now understands the need for fluid and salt restriction but did not understand that in her fluid count that it includes ice, jello, etc. She did eat out at [employer] for her birthday yesterday.   Past Medical History  Diagnosis Date  . Asthma   . Obesity   . Hypercholesteremia   . Heart murmur   . Atrial fibrillation with rapid ventricular response 09/03/2014  . Bifascicular block 09/12/2013  . 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 Pure hypercholesterolemia 09/12/2013  . Severe obesity (BMI >= 40) 09/12/2013  . 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 Chronic diastolic heart failure   . Acute renal artery occlusion 09/03/2014  . 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 Abdominal hysterectomy    . Knee arthroscopy    . Cardiac catheterization N/A 09/13/2014    Procedure: Right/Left Heart Cath and Coronary Angiography;  Surgeon: Corky Crafts, MD;  Location: [facility] INVASIVE CV LAB;  Service: Cardiovascular;  Laterality: N/A;     Medications: Current Outpatient Prescriptions  Medication Sig Dispense Refill  . b complex vitamins capsule Take 1 capsule by mouth daily.    . benzonatate (TESSALON) 200 MG capsule Take 1 capsule (200 mg total) by mouth 3 (three) times daily as needed for cough. 30 capsule 0  .  Blood Pressure Monitoring (BLOOD PRESSURE CUFF) MISC Automatic 1 each 0  . cholecalciferol (VITAMIN D) 1000 UNITS tablet Take 2,000 Units by mouth daily.     . Coenzyme Q10 (COQ-10) 10 MG CAPS Take 1 capsule by mouth daily.    Marland Kitchen diltiazem (CARDIZEM) 60 MG tablet Take 1 tablet (60 mg total) by mouth every 6 (six) hours. 120 tablet 0  . feeding supplement, ENSURE ENLIVE, (ENSURE ENLIVE) LIQD Take 237 mLs by mouth 3  (three) times daily between meals. 237 mL 12  . magnesium chloride (SLOW-MAG) 64 MG TBEC SR tablet Take 1 tablet (64 mg total) by mouth 2 (two) times daily. 60 tablet 0  . metoprolol (LOPRESSOR) 50 MG tablet Take 1 tablet (50 mg total) by mouth 2 (two) times daily. 60 tablet 0  . mometasone-formoterol (DULERA) 100-5 MCG/ACT AERO Inhale 2 puffs into the lungs as needed for wheezing.    Marland Kitchen morphine (MSIR) 15 MG tablet Take 1-2 tablets (15-30 mg total) by mouth every 4 (four) hours as needed for moderate pain or severe pain. 30 tablet 0  . OMEGA-3 FATTY ACIDS PO Take 1,600 mg by mouth every Monday, Wednesday, and Friday.     . pantoprazole (PROTONIX) 40 MG tablet Take 1 tablet (40 mg total) by mouth daily. Take 30-60 min before first meal of the day 30 tablet 2  . pravastatin (PRAVACHOL) 40 MG tablet Take 40 mg by mouth daily.     Marland Kitchen warfarin (COUMADIN) 7.5 MG tablet Take 1 tablet (7.5 mg total) by mouth daily at 6 PM. 30 tablet 0  . furosemide (LASIX) 40 MG tablet Taking 1 1/2 tablets (60 mg) in the AM and 1 tablet (40mg ) in the PM 90 tablet 6   No current facility-administered medications for this visit.    Allergies: Allergies  Allergen Reactions  . Codeine Itching    Social History: The patient  reports that she has never smoked. She has never used smokeless tobacco. She reports that she drinks alcohol. She reports that she does not use illicit drugs.   Family History: The patient's family history includes Prostate cancer in her father; Stroke in her mother.   Review of Systems: Please see the history of present illness.   Otherwise, the review of systems is positive for appetite change, cough, DOE and recent chills.   All other systems are reviewed and negative.   Physical Exam: VS:  BP 100/68 mmHg  Pulse 79  Ht 4\' 11"  (1.499 m)  Wt 262 lb 12.8 oz (119.205 kg)  BMI 53.05 kg/m2  SpO2 98% .  BMI Body mass index is 53.05 kg/(m^2).  Wt Readings from Last 3 Encounters:  10/02/14 262  lb 12.8 oz (119.205 kg)  09/18/14 255 lb 9.6 oz (115.939 kg)  08/28/14 235 lb 9.6 oz (106.867 kg)    General: She is in a wheelchair. She looks chronically ill. Morbidly obese but in no acute distress. Weight is up.  HEENT: Normal. Neck: Supple, + JVD at 90 degrees noted.  Cardiac: Irregular irregular rhythm. Rate is ok. Outflow murmur noted. Heart tones distant.  Respiratory:  Lungs are fairly clear with normal work of breathing.  GI: obese but soft MS: No deformity or atrophy. Gait not tested Skin: Warm and dry. Color is sallow Neuro:  Strength and sensation are intact and no gross focal deficits noted.  Psych: Alert, appropriate and with normal affect.   LABORATORY DATA:  EKG:  EKG is ordered today. This demonstrates atrial fib with  RBBB and LAFB. Rate is 79.  Lab Results  Component Value Date   WBC 10.5 09/18/2014   HGB 10.7* 09/18/2014   HCT 33.3* 09/18/2014   PLT 281 09/18/2014   GLUCOSE 106* 09/18/2014   ALT 69* 09/03/2014   AST 60* 09/03/2014   NA 138 09/18/2014   K 4.8 09/18/2014   CL 102 09/18/2014   CREATININE 1.60* 09/18/2014   BUN 33* 09/18/2014   CO2 29 09/18/2014   TSH 1.978 09/04/2014   INR 1.8 10/02/2014   Lab Results  Component Value Date   INR 1.8 10/02/2014   INR 5.3 09/24/2014   INR 8.0 09/21/2014     BNP (last 3 results) No results for input(s): BNP in the last 8760 hours.  ProBNP (last 3 results) No results for input(s): PROBNP in the last 8760 hours.   Other Studies Reviewed Today: Cardiac Cath Conclusion from 09/2014    No significant coronary artery disease.  Severe pulmonary artery hypertension. Cardiac output 3.5 L/m. Cardiac index 1.7. Pulmonary artery saturation 42%.  Severe mitral stenosis. Valve area 0.58 cm.  Mild aortic valve gradient.  The patient will follow-up with Dr. Cornelius Moras regarding mitral valve surgery. Restart heparin in 4 hours. Further management per the inpatient team.     Echo Study Conclusions from  09/2014  - Left ventricle: The cavity size was normal. Wall thickness was increased in a pattern of moderate LVH. Systolic function was normal. The estimated ejection fraction was in the range of 50% to 55%. Wall motion was normal; there were no regional wall motion abnormalities. - Ventricular septum: The contour showed diastolic flattening. - Aortic valve: There was mild to moderate stenosis. There was trivial regurgitation. Valve area (VTI): 1.38 cm^2. Valve area (Vmax): 1.52 cm^2. Valve area (Vmean): 1.41 cm^2. - Mitral valve: The findings are consistent with severe stenosis. Valve area by continuity equation (using LVOT flow): 1.15 cm^2. - Left atrium: The atrium was mildly dilated. There was a largethrombus. - Right ventricle: Systolic function was severely reduced. - Right atrium: The atrium was moderately dilated. - Tricuspid valve: There was moderate regurgitation. - Pulmonary arteries: Systolic pressure was moderately increased. PA peak pressure: 64 mm Hg (S).  Assessment/Plan:  1. Rheumatic Heart Disease: severe mitral stenosis and mild aortic stenosis by cath. CT surgery recommends 4-6 weeks of warfarin for left atrial thrombus followed by valve surgery. Repeat TEE and possible amiodarone load prior to surgery once she has been anticoagulated.  2. Atrial Fibrillation w/ RVR: rate is controlled. She is asymptomatic. Continue Cardizem and metoprolol for rate. Continue Warfarin.   3. Acute Diastolic CHF: EF 78-29%. Still volume overloaded -weight is up - I have increased her Lasix today to 60 mg in the AM and 40 mg in the PM. We have talked at length about salt restriction and fluid restriction.  4. Essential HTN: soft but stable.   5. Atrial Thrombus: On Warfarin. Hypercoag panel negative except for mildly low protein C level and activity and mildly low antithrombin III which were likely due to acute thrombus. Also drawn while on heparin gtt (Cardiolipin,  homocysteine, beta2-glycoprotein, lupus ac, protein S activity and total, prothrombin, and factor V leiden all negative.) Repeat TEE prior to surgery.  6. Renal Infarct: due to atrial thrombus. On anticoagulation with Warfarin. Creatinine on Friday was 1.5.  I think her overall situation is quite tenuous at best. She needs close follow up going forward. I will try to see weekly. Recheck labs on return.  Current medicines are reviewed with the patient today.  The patient does not have concerns regarding medicines other than what has been noted above.  The following changes have been made:  See above.  Labs/ tests ordered today include:    Orders Placed This Encounter  Procedures  . EKG 12-Lead     Disposition:   FU with me on Monday.   Patient is agreeable to this plan and will call if any problems develop in the interim.   Signed: Rosalio Macadamia, RN, ANP-C 10/02/2014 4:31 PM  [facility] [address] [place], [place]  [address] Phone: [phone] Fax: [phone]

## 2014-10-02 NOTE — Patient Instructions (Addendum)
We will be checking the following labs today - NONE  We will check labs on Monday when I see you back   Medication Instructions:    Continue with your current medicines but I am   Increasing the Lasix to 60 mg in the AM and 40 mg in the PM - this has been sent to your pharmacy     Testing/Procedures To Be Arranged:  N/A  Follow-Up:   See me this coming Monday with labs.    Other Special Instructions:   Restrict your salt and your fluid intake (remember this includes ice/jello)  Call the [facility] office at [phone] if you have any questions, problems or concerns.

## 2014-10-02 NOTE — Telephone Encounter (Signed)
New message      Need order or OT for 2x per week for 3 weeks.  Verbal order is ok.  OK to leave msg on vm--please leave nurses name, title and ok for OT.

## 2014-10-02 NOTE — Telephone Encounter (Signed)
V/O given to [facility]

## 2014-10-08 ENCOUNTER — Ambulatory Visit (INDEPENDENT_AMBULATORY_CARE_PROVIDER_SITE_OTHER): Payer: Commercial Managed Care - HMO | Admitting: Nurse Practitioner

## 2014-10-08 ENCOUNTER — Ambulatory Visit (INDEPENDENT_AMBULATORY_CARE_PROVIDER_SITE_OTHER): Payer: Commercial Managed Care - HMO | Admitting: Pharmacist

## 2014-10-08 ENCOUNTER — Encounter: Payer: Self-pay | Admitting: Nurse Practitioner

## 2014-10-08 VITALS — BP 100/80 | HR 55 | Ht 59.0 in | Wt 253.1 lb

## 2014-10-08 DIAGNOSIS — I4891 Unspecified atrial fibrillation: Secondary | ICD-10-CM

## 2014-10-08 DIAGNOSIS — I213 ST elevation (STEMI) myocardial infarction of unspecified site: Secondary | ICD-10-CM

## 2014-10-08 DIAGNOSIS — I513 Intracardiac thrombosis, not elsewhere classified: Secondary | ICD-10-CM

## 2014-10-08 DIAGNOSIS — I5032 Chronic diastolic (congestive) heart failure: Secondary | ICD-10-CM

## 2014-10-08 DIAGNOSIS — E7800 Pure hypercholesterolemia, unspecified: Secondary | ICD-10-CM

## 2014-10-08 DIAGNOSIS — I050 Rheumatic mitral stenosis: Secondary | ICD-10-CM

## 2014-10-08 LAB — BASIC METABOLIC PANEL
BUN: 26 mg/dL — ABNORMAL HIGH (ref 6–23)
CO2: 29 mEq/L (ref 19–32)
Calcium: 9 mg/dL (ref 8.4–10.5)
Chloride: 102 mEq/L (ref 96–112)
Creatinine, Ser: 1.45 mg/dL — ABNORMAL HIGH (ref 0.40–1.20)
GFR: 47.53 mL/min — ABNORMAL LOW (ref >60.00)
Glucose, Bld: 102 mg/dL — ABNORMAL HIGH (ref 70–99)
Potassium: 3.7 mEq/L (ref 3.5–5.1)
Sodium: 140 mEq/L (ref 135–145)

## 2014-10-08 LAB — HEPATIC FUNCTION PANEL
ALT: 47 U/L — ABNORMAL HIGH (ref 0–35)
AST: 46 U/L — ABNORMAL HIGH (ref 0–37)
Albumin: 3.3 g/dL — ABNORMAL LOW (ref 3.5–5.2)
Alkaline Phosphatase: 132 U/L — ABNORMAL HIGH (ref 39–117)
Bilirubin, Direct: 0.4 mg/dL — ABNORMAL HIGH (ref 0.0–0.3)
Total Bilirubin: 1 mg/dL (ref 0.2–1.2)
Total Protein: 6 g/dL (ref 6.0–8.3)

## 2014-10-08 LAB — POCT INR: INR: 3.7

## 2014-10-08 LAB — CBC
HCT: 40.4 % (ref 36.0–46.0)
Hemoglobin: 12.8 g/dL (ref 12.0–15.0)
MCHC: 31.8 g/dL (ref 30.0–36.0)
MCV: 90.1 fl (ref 78.0–100.0)
Platelets: 201 10*3/uL (ref 150.0–400.0)
RBC: 4.48 Mil/uL (ref 3.87–5.11)
RDW: 19.1 % — ABNORMAL HIGH (ref 11.5–15.5)
WBC: 8.6 10*3/uL (ref 4.0–10.5)

## 2014-10-08 LAB — BRAIN NATRIURETIC PEPTIDE: Pro B Natriuretic peptide (BNP): 737 pg/mL — ABNORMAL HIGH (ref 0.0–100.0)

## 2014-10-08 NOTE — Progress Notes (Signed)
CARDIOLOGY OFFICE NOTE  Date:  10/08/2014    [name] [name] Date of Birth: 09-02-1955 Medical Record #[id number]  PCP:  Delorse Lek, MD  Cardiologist:  Patty Sermons    Chief Complaint  Patient presents with  . Cardiac Valve Problem    4 day check - seen for Dr. Patty Sermons    History of Present Illness: [name] [name] is a 59 y.o. female who presents today for a 4 day check. Seen for Dr. Patty Sermons.   She has a history of asthma / pseudoasthma and known valvular heart disease/rheumatic heart disease. Last seen in the office back in May by Dr. Patty Sermons.   She presented to the ED earlier this month with cough, abdominal pain and shortness of breath/respiratory failure. Found to be in AF with RVR. 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 She was anticoagulated with heparin. ECHO demonstrated severe mitral valve stenosis and moderate AV stenosis. Her acute hypoxic respiratory failure, dyspnea, left atrial thrombus, atrial fibrillation, and moderate pulmonary hypertension are all probably secondary to her severe mitral stenosis. Her course was also complicated by AKI secondary to a complete left renal infarction which gradually resolved. Most of her pre-surgery work up has been completed during this hospitalization. Plan is for her to undergo valve replacement surgery in 4-6 weeks after being anticoagulated for at least 4 to 6 weeks with possible TEE and amiodarone loading.   Since discharge, there has been lability in her INR - has been up to 9. Saw nephrology just prior to my visit - had labs - basically unchanged.   I saw her last Thursday - pretty tenuous situation. Still with cough and dyspnea. Worse with lying down.  Weight was climbing. Lots of edema on exam. Lasix increased. Spent most of the visit going over exactly what is wrong and the  need for salt/fluid restriction, etc.   Comes in today. Here with her daughter. She is a little better today. Breathing little easier. Belly not as bloated. Still with edema but less. Weight is down. Poor appetite. Still easily nauseated. Still with a cough. But she notes overall that she is a little better.    Past Medical History  Diagnosis Date  . Asthma   . Obesity   . Hypercholesteremia   . Heart murmur   . Atrial fibrillation with rapid ventricular response 09/03/2014  . Bifascicular block 09/12/2013  . 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 Pure hypercholesterolemia 09/12/2013  . Severe obesity (BMI >= 40) 09/12/2013  . 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 Chronic diastolic heart failure   . Acute renal artery occlusion 09/03/2014  . 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 Abdominal hysterectomy    . Knee arthroscopy    . Cardiac catheterization N/A 09/13/2014    Procedure: Right/Left Heart Cath and Coronary Angiography;  Surgeon: Corky Crafts, MD;  Location: [facility] INVASIVE CV LAB;  Service: Cardiovascular;  Laterality: N/A;     Medications: Current Outpatient Prescriptions  Medication Sig Dispense Refill  . b complex vitamins capsule Take 1 capsule by mouth daily.    . benzonatate (TESSALON) 200 MG capsule Take 1 capsule (200 mg total) by mouth 3 (three) times daily as needed for cough. 30 capsule 0  . Blood Pressure Monitoring (BLOOD PRESSURE CUFF) MISC  Automatic 1 each 0  . cholecalciferol (VITAMIN D) 1000 UNITS tablet Take 2,000 Units by mouth daily.     . Coenzyme Q10 (COQ-10) 10 MG CAPS Take 1 capsule by mouth daily.    Marland Kitchen diltiazem (CARDIZEM) 60 MG tablet Take 1 tablet (60 mg total) by mouth every 6 (six) hours. 120 tablet 0  . feeding supplement, ENSURE ENLIVE, (ENSURE ENLIVE) LIQD Take 237 mLs by mouth 3 (three) times daily between meals. 237 mL 12  . furosemide (LASIX) 40 MG tablet Taking  1 1/2 tablets (60 mg) in the AM and 1 tablet (40mg ) in the PM 90 tablet 6  . magnesium chloride (SLOW-MAG) 64 MG TBEC SR tablet Take 1 tablet (64 mg total) by mouth 2 (two) times daily. 60 tablet 0  . metoprolol (LOPRESSOR) 50 MG tablet Take 1 tablet (50 mg total) by mouth 2 (two) times daily. 60 tablet 0  . mometasone-formoterol (DULERA) 100-5 MCG/ACT AERO Inhale 2 puffs into the lungs as needed for wheezing.    Marland Kitchen morphine (MSIR) 15 MG tablet Take 1-2 tablets (15-30 mg total) by mouth every 4 (four) hours as needed for moderate pain or severe pain. 30 tablet 0  . OMEGA-3 FATTY ACIDS PO Take 1,600 mg by mouth every Monday, Wednesday, and Friday.     . pantoprazole (PROTONIX) 40 MG tablet Take 1 tablet (40 mg total) by mouth daily. Take 30-60 min before first meal of the day 30 tablet 2  . pravastatin (PRAVACHOL) 40 MG tablet Take 40 mg by mouth daily.     Marland Kitchen warfarin (COUMADIN) 7.5 MG tablet Take 1 tablet (7.5 mg total) by mouth daily at 6 PM. 30 tablet 0   No current facility-administered medications for this visit.    Allergies: Allergies  Allergen Reactions  . Codeine Itching    Social History: The patient  reports that she has never smoked. She has never used smokeless tobacco. She reports that she drinks alcohol. She reports that she does not use illicit drugs.   Family History: The patient's family history includes Prostate cancer in her father; Stroke in her mother.   Review of Systems: Please see the history of present illness.   Otherwise, the review of systems is positive for none.   All other systems are reviewed and negative.   Physical Exam: VS:  BP 100/80 mmHg  Pulse 55  Ht 4\' 11"  (1.499 m)  Wt 253 lb 1.9 oz (114.814 kg)  BMI 51.10 kg/m2  SpO2 97% .  BMI Body mass index is 51.1 kg/(m^2).  Wt Readings from Last 3 Encounters:  10/08/14 253 lb 1.9 oz (114.814 kg)  10/02/14 262 lb 12.8 oz (119.205 kg)  09/18/14 255 lb 9.6 oz (115.939 kg)    General: Pleasant. She  remains obese. She is in a wheelchair. Her weight is down 9 pounds over the last 4 days. She is in no acute distress.  HEENT: Normal. Neck: Supple, + JVD, carotid bruits, and no masses noted.  Cardiac: Irregular irregular rhythm. Systolic murmur noted. Heart tones difficult to hear due to her body habitus. Less edema but still present.  Respiratory:  Lungs fairly clear with normal work of breathing. Breath sounds decreased due to her body habitus.  GI: Soft and nontender.  MS: No deformity or atrophy. Gait not tested.  Skin: Warm and dry. Color is normal.  Neuro:  Strength and sensation are intact and no gross focal deficits noted.  Psych: Alert, appropriate and with normal affect.  LABORATORY DATA:  EKG:  EKG is not ordered today.   Lab Results  Component Value Date   WBC 10.5 09/18/2014   HGB 10.7* 09/18/2014   HCT 33.3* 09/18/2014   PLT 281 09/18/2014   GLUCOSE 106* 09/18/2014   ALT 69* 09/03/2014   AST 60* 09/03/2014   NA 138 09/18/2014   K 4.8 09/18/2014   CL 102 09/18/2014   CREATININE 1.60* 09/18/2014   BUN 33* 09/18/2014   CO2 29 09/18/2014   TSH 1.978 09/04/2014   INR 3.7 10/08/2014   Lab Results  Component Value Date   INR 3.7 10/08/2014   INR 1.8 10/02/2014   INR 5.3 09/24/2014     BNP (last 3 results) No results for input(s): BNP in the last 8760 hours.  ProBNP (last 3 results) No results for input(s): PROBNP in the last 8760 hours.   Other Studies Reviewed Today:  Cardiac Cath Conclusion from 09/2014    No significant coronary artery disease.  Severe pulmonary artery hypertension. Cardiac output 3.5 L/m. Cardiac index 1.7. Pulmonary artery saturation 42%.  Severe mitral stenosis. Valve area 0.58 cm.  Mild aortic valve gradient.  The patient will follow-up with Dr. Cornelius Moras regarding mitral valve surgery. Restart heparin in 4 hours. Further management per the inpatient team.     Echo Study Conclusions from 09/2014  - Left ventricle: The  cavity size was normal. Wall thickness was increased in a pattern of moderate LVH. Systolic function was normal. The estimated ejection fraction was in the range of 50% to 55%. Wall motion was normal; there were no regional wall motion abnormalities. - Ventricular septum: The contour showed diastolic flattening. - Aortic valve: There was mild to moderate stenosis. There was trivial regurgitation. Valve area (VTI): 1.38 cm^2. Valve area (Vmax): 1.52 cm^2. Valve area (Vmean): 1.41 cm^2. - Mitral valve: The findings are consistent with severe stenosis. Valve area by continuity equation (using LVOT flow): 1.15 cm^2. - Left atrium: The atrium was mildly dilated. There was a largethrombus. - Right ventricle: Systolic function was severely reduced. - Right atrium: The atrium was moderately dilated. - Tricuspid valve: There was moderate regurgitation. - Pulmonary arteries: Systolic pressure was moderately increased. PA peak pressure: 64 mm Hg (S).  Assessment/Plan:  1. Rheumatic Heart Disease: severe mitral stenosis and mild aortic stenosis by cath. CT surgery recommends 4-6 weeks of warfarin for left atrial thrombus followed by valve surgery. She was discharged on 09/18/2014 and has been anticoagulated since that. Repeat TEE and possible amiodarone load prior to surgery has been recommended.   I will need guidance as to when to start her amiodarone. She will have been on coumadin 4 weeks when I see her back next week. Could consider scheduling TEE next week.  2. Atrial Fibrillation w/ RVR: rate is controlled. She is asymptomatic. Continue Cardizem and metoprolol for rate. Continue Warfarin.   3. Acute Diastolic CHF: EF 13-08%. Still volume overloaded -but her weight is coming down. Will leave on the higher dose of Lasix. Recheck her labs today. See back in a week  4. Essential HTN: soft but stable. She is totally asymptomatic.   5. Atrial Thrombus: On Warfarin. Hypercoag  panel negative except for mildly low protein C level and activity and mildly low antithrombin III which were likely due to acute thrombus. Also drawn while on heparin gtt (Cardiolipin, homocysteine, beta2-glycoprotein, lupus ac, protein S activity and total, prothrombin, and factor V leiden all negative.) Repeat TEE prior to surgery.  6. Renal Infarct:  due to atrial thrombus. On anticoagulation with Warfarin. Creatinine on Friday was 1.5. Rechecking her labs today.   I think her overall situation is quite tenuous at best but she does look a little better. I will see weekly. Recheck labs today.        Current medicines are reviewed with the patient today.  The patient does not have concerns regarding medicines other than what has been noted above.  The following changes have been made:  See above.  Labs/ tests ordered today include:    Orders Placed This Encounter  Procedures  . Brain natriuretic peptide  . Basic metabolic panel  . CBC  . Hepatic function panel     Disposition:   FU with me in one week.   Patient is agreeable to this plan and will call if any problems develop in the interim.   Signed: Rosalio Macadamia, RN, ANP-C 10/08/2014 10:39 AM  [facility] [address] [place], [place]  [address] Phone: [phone] Fax: [phone]

## 2014-10-08 NOTE — Patient Instructions (Addendum)
We will be checking the following labs today - BMET, CBC, HPF and BNP  Coumadin check weekly   Medication Instructions:    Continue with your current medicines.   Stay on the increased dose of diuretics     Testing/Procedures To Be Arranged:  N/A  Follow-Up:   See me next Monday    Other Special Instructions:   Keep limiting your salt and fluid intake as you are doing.  Call the [facility] office at [phone] if you have any questions, problems or concerns.

## 2014-10-08 NOTE — Telephone Encounter (Signed)
Agree with plans for OT

## 2014-10-10 ENCOUNTER — Telehealth: Payer: Self-pay | Admitting: Cardiology

## 2014-10-10 DIAGNOSIS — I5032 Chronic diastolic (congestive) heart failure: Secondary | ICD-10-CM

## 2014-10-10 DIAGNOSIS — R0600 Dyspnea, unspecified: Secondary | ICD-10-CM

## 2014-10-10 DIAGNOSIS — I4891 Unspecified atrial fibrillation: Secondary | ICD-10-CM

## 2014-10-10 DIAGNOSIS — N280 Ischemia and infarction of kidney: Secondary | ICD-10-CM

## 2014-10-10 DIAGNOSIS — R05 Cough: Secondary | ICD-10-CM

## 2014-10-10 DIAGNOSIS — J45991 Cough variant asthma: Secondary | ICD-10-CM

## 2014-10-10 DIAGNOSIS — I060 Rheumatic aortic stenosis: Secondary | ICD-10-CM

## 2014-10-10 DIAGNOSIS — R058 Other specified cough: Secondary | ICD-10-CM

## 2014-10-10 DIAGNOSIS — I050 Rheumatic mitral stenosis: Secondary | ICD-10-CM

## 2014-10-10 NOTE — Telephone Encounter (Signed)
Routed to Dr. Patty Sermons. Will consider speaking with DOD if Dr. Patty Sermons is unavailable to contact by messaging today.

## 2014-10-10 NOTE — Telephone Encounter (Signed)
New message    Need an order for 3-1 commode  - patient   Fax # [phone]

## 2014-10-10 NOTE — Telephone Encounter (Signed)
Order entry completed for 3-1 commode and printed order faxed to [facility]- [facility] [phone].

## 2014-10-10 NOTE — Telephone Encounter (Signed)
Okay to order 3-1 commode.

## 2014-10-15 ENCOUNTER — Other Ambulatory Visit: Payer: Self-pay | Admitting: Nurse Practitioner

## 2014-10-15 ENCOUNTER — Encounter: Payer: Self-pay | Admitting: Nurse Practitioner

## 2014-10-15 ENCOUNTER — Ambulatory Visit (INDEPENDENT_AMBULATORY_CARE_PROVIDER_SITE_OTHER): Payer: Commercial Managed Care - HMO | Admitting: *Deleted

## 2014-10-15 ENCOUNTER — Ambulatory Visit (INDEPENDENT_AMBULATORY_CARE_PROVIDER_SITE_OTHER): Payer: Commercial Managed Care - HMO | Admitting: Nurse Practitioner

## 2014-10-15 VITALS — BP 118/86 | HR 70 | Ht 60.0 in | Wt 241.0 lb

## 2014-10-15 DIAGNOSIS — I513 Intracardiac thrombosis, not elsewhere classified: Secondary | ICD-10-CM

## 2014-10-15 DIAGNOSIS — I4891 Unspecified atrial fibrillation: Secondary | ICD-10-CM

## 2014-10-15 DIAGNOSIS — I060 Rheumatic aortic stenosis: Secondary | ICD-10-CM

## 2014-10-15 DIAGNOSIS — I5032 Chronic diastolic (congestive) heart failure: Secondary | ICD-10-CM | POA: Diagnosis not present

## 2014-10-15 DIAGNOSIS — I050 Rheumatic mitral stenosis: Secondary | ICD-10-CM

## 2014-10-15 DIAGNOSIS — I213 ST elevation (STEMI) myocardial infarction of unspecified site: Secondary | ICD-10-CM | POA: Diagnosis not present

## 2014-10-15 LAB — BASIC METABOLIC PANEL
BUN: 19 mg/dL (ref 7–25)
CO2: 26 mmol/L (ref 20–31)
Calcium: 9.1 mg/dL (ref 8.6–10.4)
Chloride: 102 mmol/L (ref 98–110)
Creat: 1.22 mg/dL — ABNORMAL HIGH (ref 0.50–1.05)
Glucose, Bld: 93 mg/dL (ref 65–99)
Potassium: 3.5 mmol/L (ref 3.5–5.3)
Sodium: 141 mmol/L (ref 135–146)

## 2014-10-15 LAB — POCT INR: INR: 1.7

## 2014-10-15 MED ORDER — METOPROLOL TARTRATE 50 MG PO TABS: 75.0000 mg | ORAL_TABLET | Freq: Two times a day (BID) | ORAL | Status: AC

## 2014-10-15 NOTE — Patient Instructions (Addendum)
We will be checking the following labs today - BMET and BNP  Coumadin check today  Medication Instructions:    Continue with your current medicines. BUT  I am increasing the Metoprolol to 75 mg (this is a pill and a half) to take twice day - this is at your drug store - start back today    Testing/Procedures To Be Arranged:  N/A  Follow-Up:   See me in one week with coumadin check.     Other Special Instructions:   We are arranging a TEE  Your provider has recommended a TEE - transesophageal echocardiogram.  You are scheduled for your TEE on Wednesday, October 5th at 11 AM with Dr. Shirlee Latch or associates. Please go to [facility] 2nd Floor Short Stay at Wednesday, October 5th at 9:30AM.  Enter through the [employer] A Do not have any food or drink after midnight on Tuesday.  You may take your medicines with a sip of water on the day of your procedure.  You will need someone to drive you home following your procedure.    Call the [facility] office at [phone] if you have any questions, problems or concerns.   Transesophageal Echocardiogram Transesophageal echocardiography (TEE) is a special type of test that produces images of the heart by using sound waves (echocardiogram). This type of echocardiography can obtain better images of the heart than standard echocardiography. TEE is done by passing a flexible tube down the esophagus. The heart is located in front of the esophagus. Because the heart and esophagus are close to one another, your health care provider can take very clear, detailed pictures of the heart via ultrasound waves. TEE may be done:  If your health care provider needs more information based on standard echocardiography findings.  If you had a stroke. This might have happened because a clot formed in your heart. TEE can visualize different areas of the heart and check for clots.  To check valve anatomy and  function.  To check for infection on the inside of your heart (endocarditis).  To evaluate the dividing wall (septum) of the heart and presence of a hole that did not close after birth (patent foramen ovale or atrial septal defect).  To help diagnose a tear in the wall of the aorta (aortic dissection).  During cardiac valve surgery. This allows the surgeon to assess the valve repair before closing the chest.  During a variety of other cardiac procedures to guide positioning of catheters.  Sometimes before a cardioversion, which is a shock to convert heart rhythm back to normal.  LET [facility] CARE PROVIDER KNOW ABOUT:   Any allergies you have.  All medicines you are taking, including vitamins, herbs, eye drops, creams, and over-the-counter medicines.  Previous problems you or members of your family have had with the use of anesthetics.  Any blood disorders you have.  Previous surgeries you have had.  Medical conditions you have.  Swallowing difficulties.  An esophageal obstruction.  RISKS AND COMPLICATIONS  Generally, TEE is a safe procedure. However, as with any procedure, complications can occur. Possible complications include an esophageal tear (rupture), perforation, aspiration and/or oversedation. You may have a sore throat following this procedure.  BEFORE THE PROCEDURE   Do not eat or drink for 6 hours before the procedure or as directed by your health care provider.  Arrange for someone to drive you home after the procedure. Do not drive yourself home. During the procedure,  you will be given medicines that can continue to make you feel drowsy and can impair your reflexes.  An IV access tube will be started in the arm.  PROCEDURE   A medicine to help you relax (sedative) will be given through the IV access tube.  A medicine may be sprayed or gargled to numb the back of the throat.  Your blood pressure, heart rate, and breathing (vital signs) will be monitored  during the procedure.  The TEE probe is a long, flexible tube. The tip of the probe is placed into the back of the mouth, and you will be asked to swallow. This helps to pass the tip of the probe into the esophagus. Once the tip of the probe is in the correct area, your health care provider can take pictures of the heart.  TEE is usually not a painful procedure. You may feel the probe press against the back of the throat. The probe does not enter the trachea and does not affect your breathing.  AFTER THE PROCEDURE   You will be in bed, resting, until you have fully returned to consciousness.  When you first awaken, your throat may feel slightly sore and will probably still feel numb. This will improve slowly over time.  You will not be allowed to eat or drink until it is clear that the numbness has improved.  Once you have been able to drink, urinate, and sit on the edge of the bed without feeling sick to your stomach (nausea) or dizzy, you may be cleared to go home.  You should have a friend or family member with you for the next 24 hours after your procedure.      Call the [facility] office at [phone] if you have any questions, problems or concerns.

## 2014-10-15 NOTE — Addendum Note (Signed)
Addended by: Arcola Jansky on: 10/15/2014 02:52 PM   Modules accepted: Orders

## 2014-10-15 NOTE — Progress Notes (Signed)
CARDIOLOGY OFFICE NOTE  Date:  10/15/2014    [name] [name] Date of Birth: 02/26/1955 Medical Record #[id number]  PCP:  Delorse Lek, MD  Cardiologist:  Patty Sermons    Chief Complaint  Patient presents with  . Cardiac Valve Problem    One week check - seen for Dr. Patty Sermons.     History of Present Illness: [name] [name] is a 59 y.o. female who presents today for a one week check. Seen for Dr. Patty Sermons.   She has a history of asthma / pseudoasthma and known valvular heart disease/rheumatic heart disease. Last seen in the office back in May by Dr. Patty Sermons.   She presented to the ED in September with cough, abdominal pain and shortness of breath/respiratory failure. Found to be in AF with RVR. 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 She was anticoagulated with heparin. ECHO demonstrated severe mitral valve stenosis and moderate AV stenosis. Her acute hypoxic respiratory failure, dyspnea, left atrial thrombus, atrial fibrillation, and moderate pulmonary hypertension are all probably secondary to her severe mitral stenosis. Her course was also complicated by AKI secondary to a complete left renal infarction which gradually resolved. Most of her pre-surgery work up has been completed during this hospitalization. Plan is for her to undergo valve replacement surgery in 4-6 weeks after being anticoagulated for at least 4 to 6 weeks with TEE and possible amiodarone loading.   Coumadin was therapeutic at 2.0 on 09/17/2014  Since discharge, there has been lability in her INR - has been up to 9.   I have seen her twice since discharge - pretty tenuous situation. Still with cough and dyspnea. Worse with lying down. Weight was climbing. Lots of edema on exam. Lasix increased. She has improved with diuresis but still fatigued/nauseated/cough.   Comes  in today. Here with a friend today. She looks a little stronger. Less edema.  Weight is down. Continues to have a cough and feel fatigued. No chest pain. Belly not as bloated. Has slept some on her side with success. She has no awareness of her AF. Not dizzy or lightheaded. She may have ran out of Metoprolol - she is not sure - daughter is managing her pill box and fills on Mondays.    Past Medical History  Diagnosis Date  . Asthma   . Obesity   . Hypercholesteremia   . Heart murmur   . Atrial fibrillation with rapid ventricular response (HCC) 09/03/2014  . Bifascicular block 09/12/2013  . 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 Pure hypercholesterolemia 09/12/2013  . Severe obesity (BMI >= 40) (HCC) 09/12/2013  . 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 Chronic diastolic heart failure (HCC)   . Acute renal artery occlusion (HCC) 09/03/2014  . 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 Abdominal hysterectomy    . Knee arthroscopy    . Cardiac catheterization N/A 09/13/2014    Procedure: Right/Left Heart Cath and Coronary Angiography;  Surgeon: Corky Crafts, MD;  Location: [facility] INVASIVE CV LAB;  Service: Cardiovascular;  Laterality: N/A;     Medications: Current Outpatient Prescriptions  Medication Sig Dispense Refill  . b complex vitamins capsule Take 1 capsule by mouth daily.    . benzonatate (TESSALON) 200 MG capsule Take 1 capsule (200 mg total) by  mouth 3 (three) times daily as needed for cough. 30 capsule 0  . Blood Pressure Monitoring (BLOOD PRESSURE CUFF) MISC Automatic 1 each 0  . cholecalciferol (VITAMIN D) 1000 UNITS tablet Take 2,000 Units by mouth daily.     . Coenzyme Q10 (COQ-10) 10 MG CAPS Take 1 capsule by mouth daily.    Marland Kitchen diltiazem (CARDIZEM) 60 MG tablet Take 1 tablet (60 mg total) by mouth every 6 (six) hours. 120 tablet 0  . feeding supplement, ENSURE ENLIVE, (ENSURE ENLIVE) LIQD  Take 237 mLs by mouth 3 (three) times daily between meals. 237 mL 12  . furosemide (LASIX) 40 MG tablet Taking 1 1/2 tablets (60 mg) in the AM and 1 tablet ( ) in the PM 90 tablet 6  . magnesium chloride (SLOW-MAG) 64 MG TBEC SR tablet Take 1 tablet (64 mg total) by mouth 2 (two) times daily. 60 tablet 0  . metoprolol (LOPRESSOR) 50 MG tablet Take 1 tablet (50 mg total) by mouth 2 (two) times daily. 60 tablet 0  . mometasone-formoterol (DULERA) 100-5 MCG/ACT AERO Inhale 2 puffs into the lungs as needed for wheezing.    Marland Kitchen morphine (MSIR) 15 MG tablet Take 1-2 tablets (15-30 mg total) by mouth every 4 (four) hours as needed for moderate pain or severe pain. 30 tablet 0  . OMEGA-3 FATTY ACIDS PO Take 1,600 mg by mouth every Monday, Wednesday, and Friday.     . pantoprazole (PROTONIX) 40 MG tablet Take 1 tablet (40 mg total) by mouth daily. Take 30-60 min before first meal of the day 30 tablet 2  . pravastatin (PRAVACHOL) 40 MG tablet Take 40 mg by mouth daily.     Marland Kitchen warfarin (COUMADIN) 7.5 MG tablet Take 1 tablet (7.5 mg total) by mouth daily at 6 PM. 30 tablet 0   No current facility-administered medications for this visit.    Allergies: Allergies  Allergen Reactions  . Codeine Itching    Social History: The patient  reports that she has never smoked. She has never used smokeless tobacco. She reports that she drinks alcohol. She reports that she does not use illicit drugs.   Family History: The patient's family history includes Prostate cancer in her father; Stroke in her mother.   Review of Systems: Please see the history of present illness.   Otherwise, the review of systems is positive for none.   All other systems are reviewed and negative.   Physical Exam: VS:  BP 118/86 mmHg  Pulse 70  Ht 5' (1.524 m)  Wt 241 lb (109.317 kg)  BMI 47.07 kg/m2 .  BMI Body mass index is 47.07 kg/(m^2).  Wt Readings from Last 3 Encounters:  10/15/14 241 lb (109.317 kg)  10/08/14 253 lb 1.9 oz  (114.814 kg)  10/02/14 262 lb 12.8 oz (119.205 kg)    General: Pleasant. She is morbidly obese but in no acute distress. She looks stronger to me today. Weight is down 12 pounds.   HEENT: Normal. Neck: Supple, + JVD.  Cardiac: Irregular irregular rhythm. Rate is fast today. Murmur noted. Much less swelling on exam.   Respiratory:  Lungs are clear to auscultation bilaterally with normal work of breathing.  GI: Soft and nontender. Abdomen remains obese.  MS: No deformity or atrophy. Gait not tested. She is in a wheelchair.  Skin: Warm and dry. Color is normal.  Neuro:  Strength and sensation are intact and no gross focal deficits noted.  Psych: Alert, appropriate and with normal affect.  LABORATORY DATA:  EKG:  EKG is ordered today. This shows AF with RVR.   Lab Results  Component Value Date   WBC 8.6 10/08/2014   HGB 12.8 10/08/2014   HCT 40.4 10/08/2014   PLT 201.0 10/08/2014   GLUCOSE 102* 10/08/2014   ALT 47* 10/08/2014   AST 46* 10/08/2014   NA 140 10/08/2014   K 3.7 10/08/2014   CL 102 10/08/2014   CREATININE 1.45* 10/08/2014   BUN 26* 10/08/2014   CO2 29 10/08/2014   TSH 1.978 09/04/2014   INR 3.7 10/08/2014   Lab Results  Component Value Date   INR 3.7 10/08/2014   INR 1.8 10/02/2014   INR 5.3 09/24/2014     BNP (last 3 results) No results for input(s): BNP in the last 8760 hours.  ProBNP (last 3 results)  Recent Labs  10/08/14 1136  PROBNP 737.0*     Other Studies Reviewed Today:  Cardiac Cath Conclusion from 09/2014    No significant coronary artery disease.  Severe pulmonary artery hypertension. Cardiac output 3.5 L/m. Cardiac index 1.7. Pulmonary artery saturation 42%.  Severe mitral stenosis. Valve area 0.58 cm.  Mild aortic valve gradient.  The patient will follow-up with Dr. Cornelius Moras regarding mitral valve surgery. Restart heparin in 4 hours. Further management per the inpatient team.     Echo Study Conclusions from  09/2014  - Left ventricle: The cavity size was normal. Wall thickness was increased in a pattern of moderate LVH. Systolic function was normal. The estimated ejection fraction was in the range of 50% to 55%. Wall motion was normal; there were no regional wall motion abnormalities. - Ventricular septum: The contour showed diastolic flattening. - Aortic valve: There was mild to moderate stenosis. There was trivial regurgitation. Valve area (VTI): 1.38 cm^2. Valve area (Vmax): 1.52 cm^2. Valve area (Vmean): 1.41 cm^2. - Mitral valve: The findings are consistent with severe stenosis. Valve area by continuity equation (using LVOT flow): 1.15 cm^2. - Left atrium: The atrium was mildly dilated. There was a largethrombus. - Right ventricle: Systolic function was severely reduced. - Right atrium: The atrium was moderately dilated. - Tricuspid valve: There was moderate regurgitation. - Pulmonary arteries: Systolic pressure was moderately increased. PA peak pressure: 64 mm Hg (S).  Assessment/Plan:  1. Rheumatic Heart Disease: severe mitral stenosis and mild aortic stenosis by cath. CT surgery recommends 4-6 weeks of warfarin for left atrial thrombus followed by valve surgery. She was discharged on 09/18/2014 and has been anticoagulated since that. Repeat TEE and possible amiodarone load prior to surgery has been recommended.   I will need guidance as to when to start her amiodarone. Will arrange for TEE later this week since she is starting her 5th week of coumadin. The procedure has been reviewed and she is willing to proceed on Wednesday, October 5th at 11 AM with Dr. Shirlee Latch.   2. Atrial Fibrillation w/ RVR: rate is not controlled. Would like to have longer diastolic filling time. She is asymptomatic. Continue Cardizem and metoprolol for rate. She is not sure if she has ran out of Metoprolol but given her HR I suspect she has. Discussed with Dr. Patty Sermons here in the office today -  will increase to 75 mg BID. Continue Warfarin.   3. Acute Diastolic CHF: EF 16-10%. Less volume overloaded -and her weight is coming down. Will leave on the higher dose of Lasix. Recheck her labs today. Continue to see weekly.   4. Essential HTN:  stable. She is  totally asymptomatic.   5. Atrial Thrombus: On Warfarin. Hypercoag panel negative except for mildly low protein C level and activity and mildly low antithrombin III which were likely due to acute thrombus. Also drawn while on heparin gtt (Cardiolipin, homocysteine, beta2-glycoprotein, lupus ac, protein S activity and total, prothrombin, and factor V leiden all negative.) Repeat TEE prior to surgery.  6. Renal Infarct: due to atrial thrombus. On anticoagulation with Warfarin. Creatinine has improved. Recheck lab today.              Current medicines are reviewed with the patient today.  The patient does not have concerns regarding medicines other than what has been noted above.  The following changes have been made:  See above.  Labs/ tests ordered today include:    Orders Placed This Encounter  Procedures  . Basic metabolic panel  . Brain natriuretic peptide     Disposition:   FU with me in 1 week.   Patient is agreeable to this plan and will call if any problems develop in the interim.   Signed: Rosalio Macadamia, RN, ANP-C 10/15/2014 2:09 PM  [facility] [address] [place], [place]  [address] Phone: [phone] Fax: [phone]

## 2014-10-16 LAB — BRAIN NATRIURETIC PEPTIDE: Brain Natriuretic Peptide: 559.6 pg/mL — ABNORMAL HIGH (ref 0.0–100.0)

## 2014-10-17 ENCOUNTER — Encounter (HOSPITAL_COMMUNITY): Payer: Self-pay | Admitting: *Deleted

## 2014-10-17 ENCOUNTER — Inpatient Hospital Stay (HOSPITAL_COMMUNITY)
Admission: AD | Admit: 2014-10-17 | Discharge: 2014-11-13 | DRG: 270 | Disposition: E | Payer: Commercial Managed Care - HMO | Source: Ambulatory Visit | Attending: Cardiology | Admitting: Cardiology

## 2014-10-17 ENCOUNTER — Ambulatory Visit (HOSPITAL_COMMUNITY)
Admission: RE | Admit: 2014-10-17 | Discharge: 2014-10-17 | Disposition: A | Discharge status: Home / Self Care | Payer: Commercial Managed Care - HMO | Source: Ambulatory Visit | Attending: Nurse Practitioner | Admitting: Nurse Practitioner

## 2014-10-17 ENCOUNTER — Encounter (HOSPITAL_COMMUNITY): Admission: AD | Disposition: E | Payer: Self-pay | Source: Ambulatory Visit | Attending: Cardiology

## 2014-10-17 DIAGNOSIS — I4510 Unspecified right bundle-branch block: Secondary | ICD-10-CM | POA: Diagnosis not present

## 2014-10-17 DIAGNOSIS — R0989 Other specified symptoms and signs involving the circulatory and respiratory systems: Secondary | ICD-10-CM

## 2014-10-17 DIAGNOSIS — J45909 Unspecified asthma, uncomplicated: Secondary | ICD-10-CM | POA: Diagnosis present

## 2014-10-17 DIAGNOSIS — I350 Nonrheumatic aortic (valve) stenosis: Secondary | ICD-10-CM

## 2014-10-17 DIAGNOSIS — I82409 Acute embolism and thrombosis of unspecified deep veins of unspecified lower extremity: Secondary | ICD-10-CM | POA: Diagnosis not present

## 2014-10-17 DIAGNOSIS — J939 Pneumothorax, unspecified: Secondary | ICD-10-CM

## 2014-10-17 DIAGNOSIS — Z7189 Other specified counseling: Secondary | ICD-10-CM | POA: Insufficient documentation

## 2014-10-17 DIAGNOSIS — Z515 Encounter for palliative care: Secondary | ICD-10-CM | POA: Insufficient documentation

## 2014-10-17 DIAGNOSIS — I4891 Unspecified atrial fibrillation: Secondary | ICD-10-CM

## 2014-10-17 DIAGNOSIS — E785 Hyperlipidemia, unspecified: Secondary | ICD-10-CM | POA: Diagnosis present

## 2014-10-17 DIAGNOSIS — J9601 Acute respiratory failure with hypoxia: Secondary | ICD-10-CM | POA: Diagnosis not present

## 2014-10-17 DIAGNOSIS — I743 Embolism and thrombosis of arteries of the lower extremities: Secondary | ICD-10-CM | POA: Diagnosis not present

## 2014-10-17 DIAGNOSIS — N280 Ischemia and infarction of kidney: Secondary | ICD-10-CM | POA: Diagnosis present

## 2014-10-17 DIAGNOSIS — G9382 Brain death: Secondary | ICD-10-CM | POA: Diagnosis not present

## 2014-10-17 DIAGNOSIS — N184 Chronic kidney disease, stage 4 (severe): Secondary | ICD-10-CM | POA: Diagnosis present

## 2014-10-17 DIAGNOSIS — J9600 Acute respiratory failure, unspecified whether with hypoxia or hypercapnia: Secondary | ICD-10-CM

## 2014-10-17 DIAGNOSIS — R739 Hyperglycemia, unspecified: Secondary | ICD-10-CM | POA: Diagnosis present

## 2014-10-17 DIAGNOSIS — Y95 Nosocomial condition: Secondary | ICD-10-CM | POA: Diagnosis present

## 2014-10-17 DIAGNOSIS — Z79899 Other long term (current) drug therapy: Secondary | ICD-10-CM | POA: Diagnosis not present

## 2014-10-17 DIAGNOSIS — I360 Nonrheumatic tricuspid (valve) stenosis: Secondary | ICD-10-CM

## 2014-10-17 DIAGNOSIS — H04129 Dry eye syndrome of unspecified lacrimal gland: Secondary | ICD-10-CM | POA: Diagnosis not present

## 2014-10-17 DIAGNOSIS — J9602 Acute respiratory failure with hypercapnia: Secondary | ICD-10-CM | POA: Diagnosis not present

## 2014-10-17 DIAGNOSIS — I10 Essential (primary) hypertension: Secondary | ICD-10-CM | POA: Diagnosis present

## 2014-10-17 DIAGNOSIS — N179 Acute kidney failure, unspecified: Secondary | ICD-10-CM | POA: Diagnosis present

## 2014-10-17 DIAGNOSIS — Z6841 Body Mass Index (BMI) 40.0 and over, adult: Secondary | ICD-10-CM

## 2014-10-17 DIAGNOSIS — I342 Nonrheumatic mitral (valve) stenosis: Secondary | ICD-10-CM

## 2014-10-17 DIAGNOSIS — R4182 Altered mental status, unspecified: Secondary | ICD-10-CM

## 2014-10-17 DIAGNOSIS — I480 Paroxysmal atrial fibrillation: Secondary | ICD-10-CM | POA: Diagnosis present

## 2014-10-17 DIAGNOSIS — I5033 Acute on chronic diastolic (congestive) heart failure: Secondary | ICD-10-CM | POA: Diagnosis present

## 2014-10-17 DIAGNOSIS — J189 Pneumonia, unspecified organism: Secondary | ICD-10-CM | POA: Diagnosis not present

## 2014-10-17 DIAGNOSIS — G935 Compression of brain: Secondary | ICD-10-CM | POA: Diagnosis not present

## 2014-10-17 DIAGNOSIS — I469 Cardiac arrest, cause unspecified: Secondary | ICD-10-CM | POA: Insufficient documentation

## 2014-10-17 DIAGNOSIS — I080 Rheumatic disorders of both mitral and aortic valves: Secondary | ICD-10-CM | POA: Diagnosis present

## 2014-10-17 DIAGNOSIS — I050 Rheumatic mitral stenosis: Secondary | ICD-10-CM | POA: Diagnosis present

## 2014-10-17 DIAGNOSIS — R52 Pain, unspecified: Secondary | ICD-10-CM | POA: Diagnosis not present

## 2014-10-17 DIAGNOSIS — I513 Intracardiac thrombosis, not elsewhere classified: Secondary | ICD-10-CM | POA: Diagnosis present

## 2014-10-17 DIAGNOSIS — I63412 Cerebral infarction due to embolism of left middle cerebral artery: Secondary | ICD-10-CM | POA: Diagnosis not present

## 2014-10-17 DIAGNOSIS — I272 Other secondary pulmonary hypertension: Secondary | ICD-10-CM | POA: Diagnosis present

## 2014-10-17 DIAGNOSIS — I130 Hypertensive heart and chronic kidney disease with heart failure and stage 1 through stage 4 chronic kidney disease, or unspecified chronic kidney disease: Principal | ICD-10-CM | POA: Diagnosis present

## 2014-10-17 DIAGNOSIS — G936 Cerebral edema: Secondary | ICD-10-CM | POA: Diagnosis not present

## 2014-10-17 DIAGNOSIS — R0600 Dyspnea, unspecified: Secondary | ICD-10-CM | POA: Diagnosis present

## 2014-10-17 DIAGNOSIS — I509 Heart failure, unspecified: Secondary | ICD-10-CM

## 2014-10-17 DIAGNOSIS — E7800 Pure hypercholesterolemia, unspecified: Secondary | ICD-10-CM | POA: Diagnosis present

## 2014-10-17 DIAGNOSIS — J9383 Other pneumothorax: Secondary | ICD-10-CM | POA: Diagnosis not present

## 2014-10-17 DIAGNOSIS — Z66 Do not resuscitate: Secondary | ICD-10-CM | POA: Diagnosis present

## 2014-10-17 DIAGNOSIS — R570 Cardiogenic shock: Secondary | ICD-10-CM | POA: Diagnosis not present

## 2014-10-17 DIAGNOSIS — I213 ST elevation (STEMI) myocardial infarction of unspecified site: Secondary | ICD-10-CM | POA: Diagnosis not present

## 2014-10-17 DIAGNOSIS — I998 Other disorder of circulatory system: Secondary | ICD-10-CM | POA: Diagnosis not present

## 2014-10-17 DIAGNOSIS — Z7901 Long term (current) use of anticoagulants: Secondary | ICD-10-CM | POA: Diagnosis not present

## 2014-10-17 DIAGNOSIS — I5032 Chronic diastolic (congestive) heart failure: Secondary | ICD-10-CM | POA: Diagnosis present

## 2014-10-17 DIAGNOSIS — Z4659 Encounter for fitting and adjustment of other gastrointestinal appliance and device: Secondary | ICD-10-CM

## 2014-10-17 DIAGNOSIS — G9340 Encephalopathy, unspecified: Secondary | ICD-10-CM | POA: Diagnosis present

## 2014-10-17 DIAGNOSIS — Z978 Presence of other specified devices: Secondary | ICD-10-CM | POA: Insufficient documentation

## 2014-10-17 DIAGNOSIS — R791 Abnormal coagulation profile: Secondary | ICD-10-CM | POA: Diagnosis present

## 2014-10-17 DIAGNOSIS — I060 Rheumatic aortic stenosis: Secondary | ICD-10-CM | POA: Diagnosis present

## 2014-10-17 DIAGNOSIS — R4020 Unspecified coma: Secondary | ICD-10-CM | POA: Diagnosis not present

## 2014-10-17 DIAGNOSIS — I639 Cerebral infarction, unspecified: Secondary | ICD-10-CM | POA: Diagnosis not present

## 2014-10-17 DIAGNOSIS — I6349 Cerebral infarction due to embolism of other cerebral artery: Secondary | ICD-10-CM | POA: Diagnosis not present

## 2014-10-17 DIAGNOSIS — Z885 Allergy status to narcotic agent status: Secondary | ICD-10-CM | POA: Diagnosis not present

## 2014-10-17 DIAGNOSIS — Z452 Encounter for adjustment and management of vascular access device: Secondary | ICD-10-CM

## 2014-10-17 DIAGNOSIS — I340 Nonrheumatic mitral (valve) insufficiency: Secondary | ICD-10-CM

## 2014-10-17 DIAGNOSIS — J948 Other specified pleural conditions: Secondary | ICD-10-CM | POA: Diagnosis present

## 2014-10-17 DIAGNOSIS — I63133 Cerebral infarction due to embolism of bilateral carotid arteries: Secondary | ICD-10-CM | POA: Insufficient documentation

## 2014-10-17 DIAGNOSIS — H21533 Iridodialysis, bilateral: Secondary | ICD-10-CM

## 2014-10-17 DIAGNOSIS — Z789 Other specified health status: Secondary | ICD-10-CM | POA: Diagnosis not present

## 2014-10-17 HISTORY — PX: TEE WITHOUT CARDIOVERSION: SHX5443

## 2014-10-17 LAB — CBC
HCT: 43 % (ref 36.0–46.0)
Hemoglobin: 13.7 g/dL (ref 12.0–15.0)
MCH: 28.5 pg (ref 26.0–34.0)
MCHC: 31.9 g/dL (ref 30.0–36.0)
MCV: 89.6 fL (ref 78.0–100.0)
PLATELETS: 206 10*3/uL (ref 150–400)
RBC: 4.8 MIL/uL (ref 3.87–5.11)
RDW: 17.2 % — ABNORMAL HIGH (ref 11.5–15.5)
WBC: 8.6 10*3/uL (ref 4.0–10.5)

## 2014-10-17 LAB — PROTIME-INR
INR: 2.09 — AB (ref 0.00–1.49)
PROTHROMBIN TIME: 23.3 s — AB (ref 11.6–15.2)

## 2014-10-17 LAB — HEPARIN LEVEL (UNFRACTIONATED): HEPARIN UNFRACTIONATED: 0.2 [IU]/mL — AB (ref 0.30–0.70)

## 2014-10-17 LAB — COMPREHENSIVE METABOLIC PANEL
ALK PHOS: 123 U/L (ref 38–126)
ALT: 46 U/L (ref 14–54)
AST: 47 U/L — ABNORMAL HIGH (ref 15–41)
Albumin: 3.2 g/dL — ABNORMAL LOW (ref 3.5–5.0)
Anion gap: 10 (ref 5–15)
BILIRUBIN TOTAL: 1.1 mg/dL (ref 0.3–1.2)
BUN: 18 mg/dL (ref 6–20)
CALCIUM: 9.2 mg/dL (ref 8.9–10.3)
CO2: 23 mmol/L (ref 22–32)
CREATININE: 1.5 mg/dL — AB (ref 0.44–1.00)
Chloride: 108 mmol/L (ref 101–111)
GFR calc non Af Amer: 37 mL/min — ABNORMAL LOW (ref >60)
GFR, EST AFRICAN AMERICAN: 43 mL/min — AB (ref >60)
GLUCOSE: 81 mg/dL (ref 65–99)
Potassium: 3.9 mmol/L (ref 3.5–5.1)
SODIUM: 141 mmol/L (ref 135–145)
TOTAL PROTEIN: 6.9 g/dL (ref 6.5–8.1)

## 2014-10-17 LAB — MRSA PCR SCREENING: MRSA BY PCR: NEGATIVE

## 2014-10-17 SURGERY — ECHOCARDIOGRAM, TRANSESOPHAGEAL
Anesthesia: Moderate Sedation

## 2014-10-17 MED ORDER — B COMPLEX VITAMINS PO CAPS: 1.0000 | ORAL_CAPSULE | Freq: Every day | ORAL | Status: DC

## 2014-10-17 MED ORDER — ACETAMINOPHEN 325 MG PO TABS
650.0000 mg | ORAL_TABLET | ORAL | Status: DC | PRN
  Administered 2014-10-22: 650 mg via ORAL
  Filled 2014-10-17: qty 2

## 2014-10-17 MED ORDER — FENTANYL CITRATE (PF) 100 MCG/2ML IJ SOLN
INTRAMUSCULAR | Status: AC
Start: 2014-10-17 — End: 2014-10-17
  Filled 2014-10-17: qty 2

## 2014-10-17 MED ORDER — FUROSEMIDE 10 MG/ML IJ SOLN
80.0000 mg | Freq: Two times a day (BID) | INTRAMUSCULAR | Status: DC
  Administered 2014-10-18 – 2014-10-21 (×7): 80 mg via INTRAVENOUS
  Filled 2014-10-17 (×7): qty 8

## 2014-10-17 MED ORDER — SODIUM CHLORIDE 0.9 % IJ SOLN
3.0000 mL | Freq: Two times a day (BID) | INTRAMUSCULAR | Status: DC
  Administered 2014-10-18 – 2014-10-24 (×10): 3 mL via INTRAVENOUS

## 2014-10-17 MED ORDER — PANTOPRAZOLE SODIUM 40 MG PO TBEC
40.0000 mg | DELAYED_RELEASE_TABLET | Freq: Every day | ORAL | Status: DC
  Administered 2014-10-17 – 2014-10-22 (×6): 40 mg via ORAL
  Filled 2014-10-17 (×6): qty 1

## 2014-10-17 MED ORDER — ONDANSETRON HCL 4 MG/2ML IJ SOLN: 4.0000 mg | Freq: Four times a day (QID) | INTRAMUSCULAR | Status: DC | PRN

## 2014-10-17 MED ORDER — MAGNESIUM CHLORIDE 64 MG PO TBEC
1.0000 | DELAYED_RELEASE_TABLET | Freq: Two times a day (BID) | ORAL | Status: DC
  Administered 2014-10-17 – 2014-10-22 (×11): 64 mg via ORAL
  Filled 2014-10-17 (×14): qty 1

## 2014-10-17 MED ORDER — MIDAZOLAM HCL 10 MG/2ML IJ SOLN
INTRAMUSCULAR | Status: DC | PRN
  Administered 2014-10-17 (×2): 2 mg via INTRAVENOUS

## 2014-10-17 MED ORDER — B COMPLEX-C PO TABS
1.0000 | ORAL_TABLET | Freq: Every day | ORAL | Status: DC
  Administered 2014-10-17 – 2014-10-24 (×8): 1 via ORAL
  Filled 2014-10-17 (×9): qty 1

## 2014-10-17 MED ORDER — PRAVASTATIN SODIUM 40 MG PO TABS
40.0000 mg | ORAL_TABLET | Freq: Every day | ORAL | Status: DC
  Administered 2014-10-17 – 2014-10-24 (×8): 40 mg via ORAL
  Filled 2014-10-17 (×8): qty 1

## 2014-10-17 MED ORDER — DILTIAZEM HCL 25 MG/5ML IV SOLN
INTRAVENOUS | Status: DC | PRN
  Administered 2014-10-17: 10 mg via INTRAVENOUS

## 2014-10-17 MED ORDER — METOPROLOL TARTRATE 50 MG PO TABS: 75.0000 mg | ORAL_TABLET | Freq: Two times a day (BID) | ORAL | Status: DC

## 2014-10-17 MED ORDER — DILTIAZEM HCL 25 MG/5ML IV SOLN
5.0000 mg | INTRAVENOUS | Status: AC
  Filled 2014-10-17: qty 5

## 2014-10-17 MED ORDER — FUROSEMIDE 10 MG/ML IJ SOLN
80.0000 mg | Freq: Once | INTRAMUSCULAR | Status: AC
  Filled 2014-10-17: qty 8

## 2014-10-17 MED ORDER — DEXTROSE 5 % IV SOLN
5.0000 mg/h | INTRAVENOUS | Status: DC
  Administered 2014-10-17 – 2014-10-18 (×2): 5 mg/h via INTRAVENOUS
  Filled 2014-10-17 (×2): qty 100

## 2014-10-17 MED ORDER — MIDAZOLAM HCL 5 MG/ML IJ SOLN
INTRAMUSCULAR | Status: AC
  Filled 2014-10-17: qty 2

## 2014-10-17 MED ORDER — BUTAMBEN-TETRACAINE-BENZOCAINE 2-2-14 % EX AERO
INHALATION_SPRAY | CUTANEOUS | Status: DC | PRN
  Administered 2014-10-17: 1 via TOPICAL
  Administered 2014-10-17: 2 via TOPICAL

## 2014-10-17 MED ORDER — FUROSEMIDE 10 MG/ML PO SOLN
ORAL | Status: DC | PRN
  Administered 2014-10-17: 80 mg via ORAL

## 2014-10-17 MED ORDER — VITAMIN D 1000 UNITS PO TABS
2000.0000 [IU] | ORAL_TABLET | Freq: Every day | ORAL | Status: DC
  Administered 2014-10-17 – 2014-10-24 (×8): 2000 [IU] via ORAL
  Filled 2014-10-17 (×9): qty 2

## 2014-10-17 MED ORDER — MORPHINE SULFATE 15 MG PO TABS
15.0000 mg | ORAL_TABLET | ORAL | Status: DC | PRN
  Administered 2014-10-23: 15 mg via ORAL
  Filled 2014-10-17: qty 1

## 2014-10-17 MED ORDER — METOPROLOL TARTRATE 1 MG/ML IV SOLN
INTRAVENOUS | Status: AC
  Filled 2014-10-17: qty 5

## 2014-10-17 MED ORDER — MOMETASONE FURO-FORMOTEROL FUM 100-5 MCG/ACT IN AERO
2.0000 | INHALATION_SPRAY | Freq: Two times a day (BID) | RESPIRATORY_TRACT | Status: DC | PRN
  Administered 2014-10-18 – 2014-10-21 (×3): 2 via RESPIRATORY_TRACT
  Filled 2014-10-17 (×3): qty 8.8

## 2014-10-17 MED ORDER — HEPARIN (PORCINE) IN NACL 100-0.45 UNIT/ML-% IJ SOLN
1350.0000 [IU]/h | INTRAMUSCULAR | Status: DC
  Administered 2014-10-17: 1200 [IU]/h via INTRAVENOUS
  Administered 2014-10-18 – 2014-10-21 (×5): 1350 [IU]/h via INTRAVENOUS
  Filled 2014-10-17 (×9): qty 250

## 2014-10-17 MED ORDER — FENTANYL CITRATE (PF) 100 MCG/2ML IJ SOLN
INTRAMUSCULAR | Status: DC | PRN
  Administered 2014-10-17: 25 ug via INTRAVENOUS

## 2014-10-17 MED ORDER — BENZONATATE 100 MG PO CAPS
200.0000 mg | ORAL_CAPSULE | Freq: Three times a day (TID) | ORAL | Status: DC | PRN
  Administered 2014-10-23: 200 mg via ORAL
  Filled 2014-10-17: qty 2

## 2014-10-17 MED ORDER — METOPROLOL TARTRATE 1 MG/ML IV SOLN
INTRAVENOUS | Status: DC | PRN
  Administered 2014-10-17 (×2): 5 mg via INTRAVENOUS

## 2014-10-17 MED ORDER — SODIUM CHLORIDE 0.9 % IJ SOLN: 3.0000 mL | INTRAMUSCULAR | Status: DC | PRN

## 2014-10-17 MED ORDER — BENZONATATE 100 MG PO CAPS: 200.0000 mg | ORAL_CAPSULE | Freq: Three times a day (TID) | ORAL | Status: DC | PRN

## 2014-10-17 MED ORDER — SODIUM CHLORIDE 0.9 % IV SOLN: 250.0000 mL | INTRAVENOUS | Status: DC | PRN

## 2014-10-17 MED ORDER — ENSURE ENLIVE PO LIQD
237.0000 mL | Freq: Three times a day (TID) | ORAL | Status: DC
  Administered 2014-10-17 – 2014-10-22 (×13): 237 mL via ORAL

## 2014-10-17 MED ORDER — DIPHENHYDRAMINE HCL 50 MG/ML IJ SOLN
INTRAMUSCULAR | Status: AC
  Filled 2014-10-17: qty 1

## 2014-10-17 MED ORDER — SODIUM CHLORIDE 0.9 % IV SOLN
INTRAVENOUS | Status: DC
  Administered 2014-10-17: 500 mL via INTRAVENOUS

## 2014-10-17 NOTE — Consult Note (Addendum)
[address]       [place] [address]             [phone]          CARDIOTHORACIC SURGERY CONSULTATION REPORT  PCP is Delorse Lek, MD Referring Provider is [facility], Eliot Ford, MD Primary Cardiologist is Cassell Clement, MD  Reason for consultation:  Severe mitral stenosis, moderate aortic stenosis, atrial fibrillation and left atrial thrombus   HPI:  Patient is a 59 year old morbidly obese African-American female with known history of rheumatic heart disease with severe mitral stenosis, moderate aortic stenosis, chronic diastolic congestive heart failure, and paroxysmal atrial fibrillation who is well known to me from her recent hospitalization at which time she presented with an acute exacerbation of chronic diastolic congestive heart failure and embolic infarction of the left kidney related to left atrial thrombus. She was anticoagulated using warfarin and discharged from the hospital with plans to return for elective surgery once her left atrial thrombus had resolved.  Over the past month the patient has been followed closely in the office by Norma Fredrickson. The patient has been somewhat confused about Coumadin therapy and her prothrombin time has been quite labile. At one point her INR got up to 9 and more recently she has been subtherapeutic. Fluid overload has improved somewhat with increased Lasix dose and her weight has been down recently.  She was set up for routine follow-up transesophageal echocardiogram earlier today, and on arrival the patient was in atrial fibrillation with rapid ventricular response. She was hypoxic on presentation. Transesophageal echocardiogram demonstrated persistence of very large left atrial thrombus with no signs of improvement whatsoever. The patient was admitted to the hospital for anticoagulation, atrial venous diuresis, and rate control for management of atrial fibrillation. Follow-up cardiothoracic surgical consultation was  requested.  The patient states that she had been doing better over the last couple weeks. She admits to persistent exertional shortness of breath and orthopnea, but lately her weight has been down a bit and her breathing has improved.  She still has a persistent dry nonproductive cough.  The remainder of her review of systems is unchanged from previously.  Past Medical History  Diagnosis Date  . Asthma   . Obesity   . Hypercholesteremia   . Heart murmur   . Atrial fibrillation with rapid ventricular response (HCC) 09/03/2014  . Bifascicular block 09/12/2013  . 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 Pure hypercholesterolemia 09/12/2013  . Severe obesity (BMI >= 40) (HCC) 09/12/2013  . 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 Chronic diastolic heart failure (HCC)   . Acute renal artery occlusion (HCC) 09/03/2014  . 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 Abdominal hysterectomy    . Knee arthroscopy    . Cardiac catheterization N/A 09/13/2014    Procedure: Right/Left Heart Cath and Coronary Angiography;  Surgeon: Corky Crafts, MD;  Location: [facility] INVASIVE CV LAB;  Service: Cardiovascular;  Laterality: N/A;    Family History  Problem Relation Age of Onset  . Stroke Mother   . Prostate cancer Father     Social History   Social History  . Marital Status: Divorced    Spouse Name: N/A  . Number of Children: 3  . Years of Education: N/A   Occupational History  . Not on file.   Social History Main Topics  . Smoking status: Never Smoker   .  Smokeless tobacco: Never Used  . Alcohol Use: 0.0 oz/week    0 Standard drinks or equivalent per week     Comment: Rare  . Drug Use: No  . Sexual Activity: Not on file   Other Topics Concern  . Not on file   Social History Narrative    Prior to Admission medications   Medication Sig Start Date End Date Taking? Authorizing Provider  b complex  vitamins capsule Take 1 capsule by mouth daily.   Yes Historical Provider, MD  benzonatate (TESSALON) 200 MG capsule Take 1 capsule (200 mg total) by mouth 3 (three) times daily as needed for cough. 08/31/14  Yes Lupita Leash, MD  cholecalciferol (VITAMIN D) 1000 UNITS tablet Take 2,000 Units by mouth daily.    Yes Historical Provider, MD  Coenzyme Q10 (COQ-10) 10 MG CAPS Take 1 capsule by mouth daily.   Yes Historical Provider, MD  diltiazem (CARDIZEM) 60 MG tablet Take 1 tablet (60 mg total) by mouth every 6 (six) hours. 09/18/14  Yes Joseph Art, DO  feeding supplement, ENSURE ENLIVE, (ENSURE ENLIVE) LIQD Take 237 mLs by mouth 3 (three) times daily between meals. 09/18/14  Yes Joseph Art, DO  furosemide (LASIX) 40 MG tablet Taking 1 1/2 tablets (60 mg) in the AM and 1 tablet (40mg ) in the PM 10/02/14  Yes Rosalio Macadamia, NP  magnesium chloride (SLOW-MAG) 64 MG TBEC SR tablet Take 1 tablet (64 mg total) by mouth 2 (two) times daily. 09/18/14  Yes Joseph Art, DO  metoprolol (LOPRESSOR) 50 MG tablet Take 1.5 tablets (75 mg total) by mouth 2 (two) times daily. 10/15/14  Yes Rosalio Macadamia, NP  mometasone-formoterol (DULERA) 100-5 MCG/ACT AERO Inhale 2 puffs into the lungs as needed for wheezing.   Yes Historical Provider, MD  morphine (MSIR) 15 MG tablet Take 1-2 tablets (15-30 mg total) by mouth every 4 (four) hours as needed for moderate pain or severe pain. 09/18/14  Yes Joseph Art, DO  pravastatin (PRAVACHOL) 40 MG tablet Take 40 mg by mouth daily.  09/11/13  Yes Historical Provider, MD  warfarin (COUMADIN) 7.5 MG tablet Take 1 tablet (7.5 mg total) by mouth daily at 6 PM. 09/18/14  Yes Joseph Art, DO  Blood Pressure Monitoring (BLOOD PRESSURE CUFF) MISC Automatic 09/18/14   Joseph Art, DO  OMEGA-3 FATTY ACIDS PO Take 1,600 mg by mouth every Monday, Wednesday, and Friday.     Historical Provider, MD  pantoprazole (PROTONIX) 40 MG tablet Take 1 tablet (40 mg total) by mouth daily. Take  30-60 min before first meal of the day 08/28/14   Nyoka Cowden, MD    Current Facility-Administered Medications  Medication Dose Route Frequency Provider Last Rate Last Dose  . 0.9 %  sodium chloride infusion  250 mL Intravenous PRN Azalee Course, PA      . acetaminophen (TYLENOL) tablet 650 mg  650 mg Oral Q4H PRN Azalee Course, PA      . B-complex with vitamin C tablet 1 tablet  1 tablet Oral Daily Laurey Morale, MD   1 tablet at 11/04/2014 1600  . benzonatate (TESSALON) capsule 200 mg  200 mg Oral TID PRN Laurey Morale, MD      . cholecalciferol (VITAMIN D) tablet 2,000 Units  2,000 Units Oral Daily Azalee Course, [place]      . diltiazem (CARDIZEM) 100 mg in dextrose 5 % 100 mL (1 mg/mL) infusion  5-20 mg/hr Intravenous  Titrated Laurey Morale, MD 5 mL/hr at 11/11/2014 1415 5 mg/hr at 10/30/2014 1415  . feeding supplement (ENSURE ENLIVE) (ENSURE ENLIVE) liquid 237 mL  237 mL Oral TID BM Azalee Course, PA      . Melene Muller ON 10/18/2014] furosemide (LASIX) injection 80 mg  80 mg Intravenous BID Azalee Course, [place]      . heparin ADULT infusion 100 units/mL (25000 units/250 mL)  1,200 Units/hr Intravenous Continuous Scarlett Presto, RPH 12 mL/hr at 11/11/2014 1345 1,200 Units/hr at 11/07/2014 1345  . magnesium chloride (SLOW-MAG) 64 MG SR tablet 64 mg  1 tablet Oral BID Azalee Course, PA      . metoprolol tartrate (LOPRESSOR) tablet 75 mg  75 mg Oral BID Azalee Course, PA      . mometasone-formoterol (DULERA) 100-5 MCG/ACT inhaler 2 puff  2 puff Inhalation BID PRN Azalee Course, PA      . morphine (MSIR) tablet 15-30 mg  15-30 mg Oral Q4H PRN Azalee Course, PA      . ondansetron (ZOFRAN) injection 4 mg  4 mg Intravenous Q6H PRN Azalee Course, PA      . pantoprazole (PROTONIX) EC tablet 40 mg  40 mg Oral Daily Azalee Course, [place]      . pravastatin (PRAVACHOL) tablet 40 mg  40 mg Oral Daily [place], [place]      . sodium chloride 0.9 % injection 3 mL  3 mL Intravenous Q12H Azalee Course, PA      . sodium chloride 0.9 % injection 3 mL  3 mL Intravenous PRN Azalee Course, PA         Allergies  Allergen Reactions  . Codeine Itching      Review of Systems:  As per HPI    Physical Exam:   BP 100/73 mmHg  Pulse 65  Temp(Src) 98.1 F (36.7 C) (Oral)  Resp 30  Ht 5' (1.524 m)  Wt 244 lb 11.4 oz (111 kg)  BMI 47.79 kg/m2  SpO2 98%  General:  Morbidly obese  well-appearing  HEENT:  Unremarkable   Neck:   no JVD, no bruits, no adenopathy   Chest:   Diminished at bases w/ few crackles, otherwise clear  CV:   RRR, II/VI systolic murmur   Abdomen:  soft, non-tender, no masses   Extremities:  warm, well-perfused, pulses not palpable, + bilateral lower extremity edema L>R  Rectal/GU  Deferred  Neuro:   Grossly non-focal and symmetrical throughout  Skin:   Clean and dry, no rashes, no breakdown   Diagnostic Tests:  Transesophageal echocardiogram performed earlier today is personally reviewed. Findings are essentially unchanged in comparison with the echocardiogram performed 1 month ago. There remains a very large thrombus within the left atrium that may have actually increased in size.  Unfortunately, the severity of aortic stenosis was not quantified but she clearly has rheumatic disease of the aortic valve with what appears to be moderate aortic stenosis.   Impression:  Patient has long-standing rheumatic heart disease with severe mitral stenosis, moderate aortic stenosis, severe pulmonary hypertension, right ventricular dysfunction and moderate to severe tricuspid regurgitation. She has recently been diagnosed with recurrent paroxysmal atrial fibrillation complicated by the presence of a very large left atrial thrombus with thromboembolic infarction of the left kidney. Previous plans to give her time to allow her left atrial thrombus to resolve on anticoagulation are clearly not working. The patient has struggled with Coumadin management and TEE performed earlier today suggests that the size of the left  atrial thrombus may have actually increased over the past  month. Under the circumstances it seems wise to make plans to proceed with surgery sooner rather than later.  We will need to discuss whether not to replace the patient's aortic and mitral valves using mechanical or bioprosthetic valves.    Plan:  I have discussed matters at length with the patient at the bedside this evening.  We tentatively plan to proceed with surgery next week on Wednesday, 10/22/2014.  I would recommend stopping warfarin and keeping the patient on intravenous heparin. I will be unavailable for the next few days but plan to follow-up with the patient on Monday, 10/22/2014.  I spent in excess of 30 minutes during the conduct of this hospital consultation and >50% of this time involved direct face-to-face encounter for counseling and/or coordination of the patient's care.   Salvatore Decent. Cornelius Moras, MD 10-29-14 6:13 PM

## 2014-10-17 NOTE — Progress Notes (Signed)
  Echocardiogram Echocardiogram Transesophageal has been performed.  Janalyn Harder 10/16/2014, 12:38 PM

## 2014-10-17 NOTE — Care Management Note (Signed)
Case Management Note  Patient Details  Name: [name] [name] MRN: [id number] Date of Birth: [birth year]  Subjective/Objective:      Adm w at fib, chf             Action/Plan:lives w fam   Expected Discharge Date:                  Expected Discharge Plan:     In-House Referral:     Discharge planning Services     Post Acute Care Choice:    Choice offered to:     DME Arranged:    DME Agency:     HH Arranged:    HH Agency:     Status of Service:     Medicare Important Message Given:    Date Medicare IM Given:    Medicare IM give by:    Date Additional Medicare IM Given:    Additional Medicare Important Message give by:     If discussed at Long Length of Stay Meetings, dates discussed:    Additional Comments: ur review done  Hanley Hays, RN 10-19-14, 2:03 PM

## 2014-10-17 NOTE — CV Procedure (Signed)
Procedure: TEE  Indication: Mitral stenosis, evaluate valve prior to surgery. She has a known LA thrombus.    I found the patient in endoscopy in atrial fibrillation with RVR, rate in 130s.  She received IV metoprolol and IV diltiazem with improved control of HR to 80s.   Sedation: Versed 4 mg, Fentanyl 25 mcg IV  Findings: Please see echo section for full report.  The left ventricle was normal in size with mild LV hypertrophy.  EF 50%.  The interventricular septum was D-shaped, suggesting RV pressure/volume overload.  The RV was moderately dilated with mild to moderate systolic dysfunction.  The mitral valve was heavily calcified and rheumatic-appearing.  There was severe mitral stenosis and minimal mitral regurgitation.  The aortic valve was moderately calcified . There was trivial aortic insufficiency and mild aortic stenosis (mean gradient not measured, ended study due to dyspnea).  There was moderate to severe tricuspid regurgitation.  Peak RV-RA pressure 50 mm Hg.  There was moderate RAE and severe LAE.  There was a very large left atrial thrombus (known from prior).  Normal caliber aorta with minimal plaque.    Patient arrived with atrial fibrillation/RVR, volume overload, and dyspnea.  I am going to admit her for HR control and diuresis.  Will give IV heparin if INR subtherapeutic (which I suspect it will be).    Marca Ancona 10-21-2014 12:39 PM

## 2014-10-17 NOTE — Progress Notes (Addendum)
ANTICOAGULATION CONSULT NOTE - Initial Consult  Pharmacy Consult for Heparin Indication: atrial fibrillation and LA thrombus  Allergies  Allergen Reactions  . Codeine Itching    Patient Measurements: Height: 5' (152.4 cm) Weight: 241 lb (109.317 kg) IBW/kg (Calculated) : 45.5 Heparin Dosing Weight: 74.4 kg  Vital Signs: Temp: 97.7 F (36.5 C) (10/05 1318) Temp Source: Oral (10/05 1318) BP: 148/82 mmHg (10/05 1318) Pulse Rate: 65 (10/05 1318)  Labs:  Recent Labs  10/15/14 1435 10/15/14 1452  INR 1.7  --   CREATININE  --  1.22*    Estimated Creatinine Clearance: 55.7 mL/min (by C-G formula based on Cr of 1.22).   Medical History: Past Medical History  Diagnosis Date  . Asthma   . Obesity   . Hypercholesteremia   . Heart murmur   . Atrial fibrillation with rapid ventricular response (HCC) 09/03/2014  . Bifascicular block 09/12/2013  . 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 Pure hypercholesterolemia 09/12/2013  . Severe obesity (BMI >= 40) (HCC) 09/12/2013  . 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 Chronic diastolic heart failure (HCC)   . Acute renal artery occlusion (HCC) 09/03/2014  . 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  Also with hx infarct to left kidney 09/03/14.  Rx assisted with anticoagulation during that admission.     INR has been labile since discharged 09/18/14.  Up to 9.9 on Coumadin 7.5 mg daily, so doses held and then decreased to 3.75 mg daily.  Subsequent INRs 1.8->3.7->1.7 on last check was on 10/3.  Coumadin 7.5 mg taken on 10/3, then 3.75 mg on 10/4. Did not taken today's dose prior to presenting for TEE.  Today's INR is pending  Current regimen: 3.75 mg daily except 7.5 mg on Mondays.   Coumadin to be held;  MVR planned on 10/16/2014.   Goal of Therapy:  Heparin level 0.3-0.7 units/ml Monitor platelets by anticoagulation protocol: Yes    Plan:    Heparin drip to begin at 1200 units/hr.   Will follow up today's PT/INR and give bolus if INR < 2.0.   First heparin level ~ 6hrs after drip begins.   Daily heparin level and CBC while on heparin.  Dennie Fetters, RPh Pager: [phone] 31-Oct-2014,2:16 PM  Addendum:   INR 2.09.  Coumadin on hold.   Will continue heparin drip at 1200 units/hr.  No bolus.   Heparin level ~ 6 hrs after drip begun.  Hilarie Fredrickson, RPh  4:23 PM

## 2014-10-17 NOTE — H&P (Signed)
Patient ID: ANTWONETTE FELIZ MRN: [id number], DOB/AGE: [birth year]   Admit date: 14-Nov-2014   Primary Physician: Delorse Lek, MD Primary Cardiologist: Dr. Patty Sermons  Pt. Profile:  [name] [name] is a 59 year old African-American female with past medical history of hyperlipidemia, obesity, persistent atrial fibrillation and rheumatic heart disease presented to outpatient TEE and noted to be in afib with RVR, last INR subtherapeutic, persistent LA thrombus and in acute HF  Problem List  Past Medical History  Diagnosis Date  . Asthma   . Obesity   . Hypercholesteremia   . Heart murmur   . Atrial fibrillation with rapid ventricular response (HCC) 09/03/2014  . Bifascicular block 09/12/2013  . 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 Pure hypercholesterolemia 09/12/2013  . Severe obesity (BMI >= 40) (HCC) 09/12/2013  . 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 Chronic diastolic heart failure (HCC)   . Acute renal artery occlusion (HCC) 09/03/2014  . 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 Abdominal hysterectomy    . Knee arthroscopy    . Cardiac catheterization N/A 09/13/2014    Procedure: Right/Left Heart Cath and Coronary Angiography;  Surgeon: Corky Crafts, MD;  Location: [facility] INVASIVE CV LAB;  Service: Cardiovascular;  Laterality: N/A;     Allergies  Allergies  Allergen Reactions  . Codeine Itching    HPI  [name] [name] is a 59 year old African-American female with PMH of hyperlipidemia, obesity, persistent atrial fibrillation and rheumatic heart disease. Patient has known rheumatic heart disease in the past. Echocardiogram obtained on 09/28/2013 showed EF 60-65%, moderately AS, rheumatic mitral valve with severe MS, moderately to severely dilated left atrium. She presented to the ED in August 2016 with cough, abdominal pain and acute respiratory failure. She was found to be in atrial  fibrillation with RVR. CT of abdomen and pelvis demonstrated large left atrial filling defect concerning for LA thrombus. This was subsequently confirmed on echocardiogram. She also had left renal artery occlusion seen on CT of abdomen as well which resulted in complete infarct of the left kidney and acute renal insufficiency which gradually resolved. TTE obtained on 09/04/2014 showed EF 50-55%, mild-to-moderate AS, severe MS, large thrombus in left atrium, severely reduced RVEF, moderate TR, peak PA pressure 64. It was felt that her new onset of atrial fibrillation was secondary to her severe MS. She was seen by Dr. Cornelius Moras and it was planned for her to undergo valve replacement surgery after being anticoagulated for at least 4-6 weeks. Prior to her discharge, she underwent a left and right heart catheterization as part of the preoperative workup, no significant coronary artery disease was found, severe pulmonary artery hypertension with cardiac output 3.5, cardiac index 1.7, pulmonary artery saturation 42%, severe mitral stenosis with valve area 0.58 cm.    Since her last discharge, patient continued to have problem with fluid retention, her Lasix has increased. However per patient, she has been having some shortness of breath, orthopnea and paroxysmal nocturnal dyspnea at home. She states she has been taking Coumadin every day, her INR level appears to be very labile with last few INR since discharge 2.06 --> 2.76 --> 9.9 --> 8.0 --> 5.3 --> 1.8 --> 3.7 --> 1.7. It appears her daughter is managing her pillbox. She presented for followup TEE on 11-14-14 for evaluation and monitoring of LA thrombus. She was in atrial fibrillation with RVR on arrival, and she appears to be hypoxic with  sign of fluid overload. TEE showed EF 50%, intraventricular septum with D-shaped suggestive of RV pressure/volume overload, moderately dilated RV with mild to moderate systolic dysfunction, mitral valve was heavily calcified and  rheumatic-appearing, severe mitral stenosis, moderate to severe tricuspid regurg, mild aortic stenosis, moderate right atrial enlargement, severe left atrial enlargement, very large left atrial thrombus. Given her symptom of atrial fibrillation with RVR and fluid overload, she will be admitted to step down for IV diuresis. Her last INR was subtherapeutic, we will restart IV heparin while bridging with Coumadin.   Home Medications  Prior to Admission medications   Medication Sig Start Date End Date Taking? Authorizing Provider  b complex vitamins capsule Take 1 capsule by mouth daily.   Yes Historical Provider, MD  benzonatate (TESSALON) 200 MG capsule Take 1 capsule (200 mg total) by mouth 3 (three) times daily as needed for cough. 08/31/14  Yes Lupita Leash, MD  cholecalciferol (VITAMIN D) 1000 UNITS tablet Take 2,000 Units by mouth daily.    Yes Historical Provider, MD  Coenzyme Q10 (COQ-10) 10 MG CAPS Take 1 capsule by mouth daily.   Yes Historical Provider, MD  diltiazem (CARDIZEM) 60 MG tablet Take 1 tablet (60 mg total) by mouth every 6 (six) hours. 09/18/14  Yes Joseph Art, DO  feeding supplement, ENSURE ENLIVE, (ENSURE ENLIVE) LIQD Take 237 mLs by mouth 3 (three) times daily between meals. 09/18/14  Yes Joseph Art, DO  furosemide (LASIX) 40 MG tablet Taking 1 1/2 tablets (60 mg) in the AM and 1 tablet (40mg ) in the PM 10/02/14  Yes Rosalio Macadamia, NP  magnesium chloride (SLOW-MAG) 64 MG TBEC SR tablet Take 1 tablet (64 mg total) by mouth 2 (two) times daily. 09/18/14  Yes Joseph Art, DO  metoprolol (LOPRESSOR) 50 MG tablet Take 1.5 tablets (75 mg total) by mouth 2 (two) times daily. 10/15/14  Yes Rosalio Macadamia, NP  mometasone-formoterol (DULERA) 100-5 MCG/ACT AERO Inhale 2 puffs into the lungs as needed for wheezing.   Yes Historical Provider, MD  morphine (MSIR) 15 MG tablet Take 1-2 tablets (15-30 mg total) by mouth every 4 (four) hours as needed for moderate pain or severe  pain. 09/18/14  Yes Joseph Art, DO  pravastatin (PRAVACHOL) 40 MG tablet Take 40 mg by mouth daily.  09/11/13  Yes Historical Provider, MD  warfarin (COUMADIN) 7.5 MG tablet Take 1 tablet (7.5 mg total) by mouth daily at 6 PM. 09/18/14  Yes Joseph Art, DO  Blood Pressure Monitoring (BLOOD PRESSURE CUFF) MISC Automatic 09/18/14   Joseph Art, DO  OMEGA-3 FATTY ACIDS PO Take 1,600 mg by mouth every Monday, Wednesday, and Friday.     Historical Provider, MD  pantoprazole (PROTONIX) 40 MG tablet Take 1 tablet (40 mg total) by mouth daily. Take 30-60 min before first meal of the day 08/28/14   Nyoka Cowden, MD    Family History  Family History  Problem Relation Age of Onset  . Stroke Mother   . Prostate cancer Father     Social History  Social History   Social History  . Marital Status: Divorced    Spouse Name: N/A  . Number of Children: 3  . Years of Education: N/A   Occupational History  . Not on file.   Social History Main Topics  . Smoking status: Never Smoker   . Smokeless tobacco: Never Used  . Alcohol Use: 0.0 oz/week    0 Standard drinks  or equivalent per week     Comment: Rare  . Drug Use: No  . Sexual Activity: Not on file   Other Topics Concern  . Not on file   Social History Narrative     Review of Systems General:  No chills, fever, night sweats or weight changes.  Cardiovascular:  No chest pain, palpitations +dyspnea on exertion, edema, orthopnea, paroxysmal nocturnal dyspnea. Dermatological: No rash, lesions/masses Respiratory: +cough, dyspnea Urologic: No hematuria, dysuria Abdominal:   No nausea, vomiting, diarrhea, bright red blood per rectum, melena, or hematemesis Neurologic:  No visual changes, wkns, changes in mental status. All other systems reviewed and are otherwise negative except as noted above.  Physical Exam  Blood pressure 110/77, pulse 70, temperature 97.7 F (36.5 C), temperature source Oral, resp. rate 20, height 5' (1.524 m),  weight 241 lb (109.317 kg), SpO2 100 %.  General: Pleasant, NAD Psych: Normal affect. Neuro: Alert and oriented X 3. Moves all extremities spontaneously. HEENT: Normal  Neck: Supple without bruits.  JVP 16 cm.  Lungs:  Resp regular and unlabored. Anterior exam bibasilar rale. Heart: irregular. no s3, s4. 1/6 diastolic murmur Abdomen: Soft, non-tender, non-distended, BS + x 4.  Extremities: No clubbing, cyanosis. DP/PT/Radials 2+ and equal bilaterally. 2+ pitting edema in LE  Labs  Troponin (Point of Care Test) No results for input(s): TROPIPOC in the last 72 hours. No results for input(s): CKTOTAL, CKMB, TROPONINI in the last 72 hours. Lab Results  Component Value Date   WBC 8.6 10/08/2014   HGB 12.8 10/08/2014   HCT 40.4 10/08/2014   MCV 90.1 10/08/2014   PLT 201.0 10/08/2014     Recent Labs Lab 10/15/14 1452  NA 141  K 3.5  CL 102  CO2 26  BUN 19  CREATININE 1.22*  CALCIUM 9.1  GLUCOSE 93    CTA of Abdomen  09/03/2014     IMPRESSION: Large filling defect in the left atrium is suggestive for thrombus. An underlying lesion cannot be excluded. This may be better characterized with an echocardiogram.  Occlusion of the proximal left renal artery with no significant flow to the left kidney. 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 Findings compatible with thromboembolic disease originating from the left atrium.  Small amount of fluid in the right abdomen and pelvis.  Critical Value/emergent results were called by telephone at the time of interpretation on 09/03/2014 at 8:46 pm to Dr. Benjiman Core , who verbally acknowledged these results.      Echocardiogram 09/04/2014  LV EF: 50% -  55%  ------------------------------------------------------------------- Indications:   Atrial fibrillation - 427.31.  ------------------------------------------------------------------- History:  PMH: Left Atrial Thrombus.  Mitral Stenosis. Dyspnea and murmur. Atrial fibrillation.  ------------------------------------------------------------------- Study Conclusions  - Left ventricle: The cavity size was normal. Wall thickness was increased in a pattern of moderate LVH. Systolic function was normal. The estimated ejection fraction was in the range of 50% to 55%. Wall motion was normal; there were no regional wall motion abnormalities. - Ventricular septum: The contour showed diastolic flattening. - Aortic valve: There was mild to moderate stenosis. There was trivial regurgitation. Valve area (VTI): 1.38 cm^2. Valve area (Vmax): 1.52 cm^2. Valve area (Vmean): 1.41 cm^2. - Mitral valve: The findings are consistent with severe stenosis. Valve area by continuity equation (using LVOT flow): 1.15 cm^2. - Left atrium: The atrium was mildly dilated. There was a largethrombus. - Right ventricle: Systolic function was severely reduced. - Right atrium: The atrium was moderately  dilated. - Tricuspid valve: There was moderate regurgitation. - Pulmonary arteries: Systolic pressure was moderately increased. PA peak pressure: 64 mm Hg (S).    L&RHC 09/13/2014  Conclusion     No significant coronary artery disease.  Severe pulmonary artery hypertension. Cardiac output 3.5 L/m. Cardiac index 1.7. Pulmonary artery saturation 42%.  Severe mitral stenosis. Valve area 0.58 cm.  Mild aortic valve gradient.  The patient will follow-up with Dr. Cornelius Moras regarding mitral valve surgery. Restart heparin in 4 hours. Further management per the inpatient team.       ASSESSMENT AND PLAN  1. Persistent atrial fibrillation with RVR - diagnosed in Aug 2016  - CHA2DS2-Vasc score 3 (female, HTN, CHF), This patients CHA2DS2-VASc Score and unadjusted Ischemic Stroke Rate (% per year) is equal to 3.2 % stroke rate/year from a score of 3  - no cardioversion planned given known large LA thrombus, focus on rate  control  - continue PO metoprolol, hold PO diltiazem, started on IV diltiazem. Likely exacerbated by acute HF and underlying known severe MS  2. Acute on chronic diastolic HF  - discharge weight on 9/6 255 lbs, on office visit 9/20 weight 262 lbs, 9/26 visit weight 253, 10/3 visit 241 lbs.   - has bibasilar rale, 2+ pitting edema in LE, will require additional diuresis. RHC result on 9/1 noted.   3. Large LA thrombus: on coumadin started post cath on 09/13/2014  - INR very labile, last few outpatient INR 2.06 --> 2.76 --> 9.9 --> 8.0 --> 5.3 --> 1.8 --> 3.7 --> 1.7  - unfortunately with severe valvular dx, NOAC is not a good option, will check PT/INR, likely subtherapeutic since her last one few days ago was 1.7, start on INR heparin. Dr. Shirlee Latch discussed with Dr. Cornelius Moras, plan to operate next week, will not restart coumadin during mean time  4. Rheumatic heart dx with severe MS and mild to moderate AS, RV dysfunction, pulmonary hypertension from AS/MS, and tricuspid regurgitation - This has led to atrial fib which was diagnosed in Aug. CT surgery/Dr. Cornelius Moras has evaluated in Aug 2016. Given large thrombus, they prefer 4-6 weeks of warfarin and possible amiodarone loading to maintain sinus rhythm.   4. HTN  5. L renal infarction: Secondary to left atrial thrombus.  - CTA of abdomen 09/03/2014 large filling defect in LA suggestive for thrombus, occlusion of prox L renal artery with no significant flow to L kidney, compatible with acute arterial occlusion with L kidney infarction.  - Cr was >2 in Aug 2016, eventually improved, now Cr 1.2  Signed, [name] Course, PA-C 11/08/2014, 1:18 PM   Patient seen with PA, agree with the above note.  I saw her in endoscopy for TEE.  She was very volume overloaded and short of breath with atrial fibrillation/RVR in 130s-140s.  HR slowed with IV diltiazem and IV metoprolol.  She got Lasix 80 mg IV x 1 in endoscopy.   1. Acute on  chronic diastolic CHF: Patient is severely volume overloaded in the setting of severe mitral stenosis with pulmonary hypertension and RV failure.  LV EF 50% with D-shaped IV septum and moderately dilated RV with mild to moderate systolic dysfunction.  She has NYHA class IIIb-IV symptoms at home.  - Lasix 80 mg IV bid for now.  - Continue HR with diltiazem gtt.  2. Atrial fibrillation: With RVR.  Large LA thrombus, does not appear to have improved with several weeks of anticoagulation (though she has been subtherapeutic at times).  -  Heparin gtt for now, hold coumadin (possible surgery).  - Control HR with diltiazem gtt.  3. Mitral stenosis: Severe by TEE, likely rheumatic.  Needs valve replacement. She additionally has probable mild aortic stenosis.  Discussed with Dr Cornelius Moras => very large LA thrombus appears unlikely to resolve any time soon with anticoagulation.  Plan surgery on MV possibly next week along with thrombus retrieval.  She has had cath showing no CAD.  4. Left renal artery infarction: Creatinine back to normal range when last checked.  5. 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  PVR 5 WU with recent RHC.  Suspect component of PAH from reactive pulmonary vascular changes in face of long-standing MS.   Marca Ancona 10/15/2014 3:33 PM

## 2014-10-17 NOTE — Progress Notes (Signed)
ANTICOAGULATION CONSULT NOTE -Consult  Pharmacy Consult for Heparin Indication: atrial fibrillation and LA thrombus  Allergies  Allergen Reactions  . Codeine Itching    Patient Measurements: Height: 5' (152.4 cm) Weight: 244 lb 11.4 oz (111 kg) IBW/kg (Calculated) : 45.5 Heparin Dosing Weight: 74.4 kg  Vital Signs: Temp: 97.8 F (36.6 C) (10/05 2018) Temp Source: Oral (10/05 2018) BP: 102/73 mmHg (10/05 2117) Pulse Rate: 86 (10/05 2018)  Labs:  Recent Labs  10/15/14 1435 10/15/14 1452 11/08/2014 1415 10/26/2014 1503 10/14/2014 2203  HGB  --   --  13.7  --   --   HCT  --   --  43.0  --   --   PLT  --   --  206  --   --   LABPROT  --   --   --  23.3*  --   INR 1.7  --   --  2.09*  --   HEPARINUNFRC  --   --   --   --  0.20*  CREATININE  --  1.22* 1.50*  --   --     Estimated Creatinine Clearance: 45.7 mL/min (by C-G formula based on Cr of 1.5).   Medical History: Past Medical History  Diagnosis Date  . Asthma   . Obesity   . Hypercholesteremia   . Heart murmur   . Atrial fibrillation with rapid ventricular response (HCC) 09/03/2014  . Bifascicular block 09/12/2013  . 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 Pure hypercholesterolemia 09/12/2013  . Severe obesity (BMI >= 40) (HCC) 09/12/2013  . 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 Chronic diastolic heart failure (HCC)   . Acute renal artery occlusion (HCC) 09/03/2014  . 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 Also with hx infarct to left kidney 09/03/14.   INR has been labile since discharged 09/18/14. INR 2.09. Heparin level 0.2 slightly low.   Goal of Therapy:  Heparin level 0.3-0.7 units/ml Monitor platelets by anticoagulation protocol: Yes   Plan:  Increase IV heparin to 1350 units/hr Recheck HL in AM.  Halleigh Comes S. Merilynn Finland, PharmD, BCPS Clinical Staff Pharmacist Pager [phone]   10:51 PM

## 2014-10-17 NOTE — Interval H&P Note (Signed)
History and Physical Interval Note:  10/15/2014 11:55 AM  [name] [name]  has presented today for surgery, with the diagnosis of AFIB  The various methods of treatment have been discussed with the patient and family. After consideration of risks, benefits and other options for treatment, the patient has consented to  Procedure(s): TRANSESOPHAGEAL ECHOCARDIOGRAM (TEE) (N/A) as a surgical intervention .  The patient's history has been reviewed, patient examined, no change in status, stable for surgery.  I have reviewed the patient's chart and labs.  Questions were answered to the patient's satisfaction.     Stephanye Finnicum [employer]

## 2014-10-17 NOTE — H&P (View-Only) (Signed)
   CARDIOLOGY OFFICE NOTE  Date:  10/15/2014    [name] Date of Birth: 01/25/1955 Medical Record #[id number]  PCP:  BURNETT,BRENT A, MD  Cardiologist:  Brackbill    Chief Complaint  Patient presents with  . Cardiac Valve Problem    One week check - seen for Dr. Brackbill.     History of Present Illness: [name] [name] is a 59 y.o. female who presents today for a one week check. Seen for Dr. Brackbill.   She has a history of asthma / pseudoasthma and known valvular heart disease/rheumatic heart disease. Last seen in the office back in May by Dr. Brackbill.   She presented to the ED in September with cough, abdominal pain and shortness of breath/respiratory failure. Found to be in AF with RVR. 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 She was anticoagulated with heparin. ECHO demonstrated severe mitral valve stenosis and moderate AV stenosis. Her acute hypoxic respiratory failure, dyspnea, left atrial thrombus, atrial fibrillation, and moderate pulmonary hypertension are all probably secondary to her severe mitral stenosis. Her course was also complicated by AKI secondary to a complete left renal infarction which gradually resolved. Most of her pre-surgery work up has been completed during this hospitalization. Plan is for her to undergo valve replacement surgery in 4-6 weeks after being anticoagulated for at least 4 to 6 weeks with TEE and possible amiodarone loading.   Coumadin was therapeutic at 2.0 on 09/17/2014  Since discharge, there has been lability in her INR - has been up to 9.   I have seen her twice since discharge - pretty tenuous situation. Still with cough and dyspnea. Worse with lying down. Weight was climbing. Lots of edema on exam. Lasix increased. She has improved with diuresis but still fatigued/nauseated/cough.   Comes  in today. Here with a friend today. She looks a little stronger. Less edema.  Weight is down. Continues to have a cough and feel fatigued. No chest pain. Belly not as bloated. Has slept some on her side with success. She has no awareness of her AF. Not dizzy or lightheaded. She may have ran out of Metoprolol - she is not sure - daughter is managing her pill box and fills on Mondays.    Past Medical History  Diagnosis Date  . Asthma   . Obesity   . Hypercholesteremia   . Heart murmur   . Atrial fibrillation with rapid ventricular response (HCC) 09/03/2014  . Bifascicular block 09/12/2013  . 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 Pure hypercholesterolemia 09/12/2013  . Severe obesity (BMI >= 40) (HCC) 09/12/2013  . 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 Chronic diastolic heart failure (HCC)   . Acute renal artery occlusion (HCC) 09/03/2014  . 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 Abdominal hysterectomy    . Knee arthroscopy    . Cardiac catheterization N/A 09/13/2014    Procedure: Right/Left Heart Cath and Coronary Angiography;  Surgeon: Jayadeep S Varanasi, MD;  Location: [facility];  Service: Cardiovascular;  Laterality: N/A;     Medications: Current Outpatient Prescriptions  Medication Sig Dispense Refill  . b complex vitamins capsule Take 1 capsule by mouth daily.    . benzonatate (TESSALON) 200 MG capsule Take 1 capsule (200 mg total) by   mouth 3 (three) times daily as needed for cough. 30 capsule 0  . Blood Pressure Monitoring (BLOOD PRESSURE CUFF) MISC Automatic 1 each 0  . cholecalciferol (VITAMIN D) 1000 UNITS tablet Take 2,000 Units by mouth daily.     . Coenzyme Q10 (COQ-10) 10 MG CAPS Take 1 capsule by mouth daily.    . diltiazem (CARDIZEM) 60 MG tablet Take 1 tablet (60 mg total) by mouth every 6 (six) hours. 120 tablet 0  . feeding supplement, ENSURE ENLIVE, (ENSURE ENLIVE) LIQD  Take 237 mLs by mouth 3 (three) times daily between meals. 237 mL 12  . furosemide (LASIX) 40 MG tablet Taking 1 1/2 tablets (60 mg) in the AM and 1 tablet (40mg) in the PM 90 tablet 6  . magnesium chloride (SLOW-MAG) 64 MG TBEC SR tablet Take 1 tablet (64 mg total) by mouth 2 (two) times daily. 60 tablet 0  . metoprolol (LOPRESSOR) 50 MG tablet Take 1 tablet (50 mg total) by mouth 2 (two) times daily. 60 tablet 0  . mometasone-formoterol (DULERA) 100-5 MCG/ACT AERO Inhale 2 puffs into the lungs as needed for wheezing.    . morphine (MSIR) 15 MG tablet Take 1-2 tablets (15-30 mg total) by mouth every 4 (four) hours as needed for moderate pain or severe pain. 30 tablet 0  . OMEGA-3 FATTY ACIDS PO Take 1,600 mg by mouth every Monday, Wednesday, and Friday.     . pantoprazole (PROTONIX) 40 MG tablet Take 1 tablet (40 mg total) by mouth daily. Take 30-60 min before first meal of the day 30 tablet 2  . pravastatin (PRAVACHOL) 40 MG tablet Take 40 mg by mouth daily.     . warfarin (COUMADIN) 7.5 MG tablet Take 1 tablet (7.5 mg total) by mouth daily at 6 PM. 30 tablet 0   No current facility-administered medications for this visit.    Allergies: Allergies  Allergen Reactions  . Codeine Itching    Social History: The patient  reports that she has never smoked. She has never used smokeless tobacco. She reports that she drinks alcohol. She reports that she does not use illicit drugs.   Family History: The patient's family history includes Prostate cancer in her father; Stroke in her mother.   Review of Systems: Please see the history of present illness.   Otherwise, the review of systems is positive for none.   All other systems are reviewed and negative.   Physical Exam: VS:  BP 118/86 mmHg  Pulse 70  Ht 5' (1.524 m)  Wt 241 lb (109.317 kg)  BMI 47.07 kg/m2 .  BMI Body mass index is 47.07 kg/(m^2).  Wt Readings from Last 3 Encounters:  10/15/14 241 lb (109.317 kg)  10/08/14 253 lb 1.9 oz  (114.814 kg)  10/02/14 262 lb 12.8 oz (119.205 kg)    General: Pleasant. She is morbidly obese but in no acute distress. She looks stronger to me today. Weight is down 12 pounds.   HEENT: Normal. Neck: Supple, + JVD.  Cardiac: Irregular irregular rhythm. Rate is fast today. Murmur noted. Much less swelling on exam.   Respiratory:  Lungs are clear to auscultation bilaterally with normal work of breathing.  GI: Soft and nontender. Abdomen remains obese.  MS: No deformity or atrophy. Gait not tested. She is in a wheelchair.  Skin: Warm and dry. Color is normal.  Neuro:  Strength and sensation are intact and no gross focal deficits noted.  Psych: Alert, appropriate and with normal affect.     LABORATORY DATA:  EKG:  EKG is ordered today. This shows AF with RVR.   Lab Results  Component Value Date   WBC 8.6 10/08/2014   HGB 12.8 10/08/2014   HCT 40.4 10/08/2014   PLT 201.0 10/08/2014   GLUCOSE 102* 10/08/2014   ALT 47* 10/08/2014   AST 46* 10/08/2014   NA 140 10/08/2014   K 3.7 10/08/2014   CL 102 10/08/2014   CREATININE 1.45* 10/08/2014   BUN 26* 10/08/2014   CO2 29 10/08/2014   TSH 1.978 09/04/2014   INR 3.7 10/08/2014   Lab Results  Component Value Date   INR 3.7 10/08/2014   INR 1.8 10/02/2014   INR 5.3 09/24/2014     BNP (last 3 results) No results for input(s): BNP in the last 8760 hours.  ProBNP (last 3 results)  Recent Labs  10/08/14 1136  PROBNP 737.0*     Other Studies Reviewed Today:  Cardiac Cath Conclusion from 09/2014    No significant coronary artery disease.  Severe pulmonary artery hypertension. Cardiac output 3.5 L/m. Cardiac index 1.7. Pulmonary artery saturation 42%.  Severe mitral stenosis. Valve area 0.58 cm.  Mild aortic valve gradient.  The patient will follow-up with Dr. Owen regarding mitral valve surgery. Restart heparin in 4 hours. Further management per the inpatient team.     Echo Study Conclusions from  09/2014  - Left ventricle: The cavity size was normal. Wall thickness was increased in a pattern of moderate LVH. Systolic function was normal. The estimated ejection fraction was in the range of 50% to 55%. Wall motion was normal; there were no regional wall motion abnormalities. - Ventricular septum: The contour showed diastolic flattening. - Aortic valve: There was mild to moderate stenosis. There was trivial regurgitation. Valve area (VTI): 1.38 cm^2. Valve area (Vmax): 1.52 cm^2. Valve area (Vmean): 1.41 cm^2. - Mitral valve: The findings are consistent with severe stenosis. Valve area by continuity equation (using LVOT flow): 1.15 cm^2. - Left atrium: The atrium was mildly dilated. There was a largethrombus. - Right ventricle: Systolic function was severely reduced. - Right atrium: The atrium was moderately dilated. - Tricuspid valve: There was moderate regurgitation. - Pulmonary arteries: Systolic pressure was moderately increased. PA peak pressure: 64 mm Hg (S).  Assessment/Plan:  1. Rheumatic Heart Disease: severe mitral stenosis and mild aortic stenosis by cath. CT surgery recommends 4-6 weeks of warfarin for left atrial thrombus followed by valve surgery. She was discharged on 09/18/2014 and has been anticoagulated since that. Repeat TEE and possible amiodarone load prior to surgery has been recommended.   I will need guidance as to when to start her amiodarone. Will arrange for TEE later this week since she is starting her 5th week of coumadin. The procedure has been reviewed and she is willing to proceed on Wednesday, October 5th at 11 AM with Dr. McLean.   2. Atrial Fibrillation w/ RVR: rate is not controlled. Would like to have longer diastolic filling time. She is asymptomatic. Continue Cardizem and metoprolol for rate. She is not sure if she has ran out of Metoprolol but given her HR I suspect she has. Discussed with Dr. Brackbill here in the office today -  will increase to 75 mg BID. Continue Warfarin.   3. Acute Diastolic CHF: EF 50-55%. Less volume overloaded -and her weight is coming down. Will leave on the higher dose of Lasix. Recheck her labs today. Continue to see weekly.   4. Essential HTN:  stable. She is   totally asymptomatic.   5. Atrial Thrombus: On Warfarin. Hypercoag panel negative except for mildly low protein C level and activity and mildly low antithrombin III which were likely due to acute thrombus. Also drawn while on heparin gtt (Cardiolipin, homocysteine, beta2-glycoprotein, lupus ac, protein S activity and total, prothrombin, and factor V leiden all negative.) Repeat TEE prior to surgery.  6. Renal Infarct: due to atrial thrombus. On anticoagulation with Warfarin. Creatinine has improved. Recheck lab today.              Current medicines are reviewed with the patient today.  The patient does not have concerns regarding medicines other than what has been noted above.  The following changes have been made:  See above.  Labs/ tests ordered today include:    Orders Placed This Encounter  Procedures  . Basic metabolic panel  . Brain natriuretic peptide     Disposition:   FU with me in 1 week.   Patient is agreeable to this plan and will call if any problems develop in the interim.   Signed: Mayrene Bastarache C. Abbagayle Zaragoza, RN, ANP-C 10/15/2014 2:09 PM  [facility] [address] [place], [place]  [address] Phone: [phone] Fax: [phone]         

## 2014-10-18 ENCOUNTER — Inpatient Hospital Stay (HOSPITAL_COMMUNITY): Payer: Commercial Managed Care - HMO

## 2014-10-18 ENCOUNTER — Telehealth: Payer: Self-pay | Admitting: *Deleted

## 2014-10-18 ENCOUNTER — Encounter (HOSPITAL_COMMUNITY): Payer: Self-pay | Admitting: Cardiology

## 2014-10-18 DIAGNOSIS — I509 Heart failure, unspecified: Secondary | ICD-10-CM

## 2014-10-18 DIAGNOSIS — I82409 Acute embolism and thrombosis of unspecified deep veins of unspecified lower extremity: Secondary | ICD-10-CM

## 2014-10-18 LAB — PROTIME-INR
INR: 2.18 — AB (ref 0.00–1.49)
PROTHROMBIN TIME: 24.1 s — AB (ref 11.6–15.2)

## 2014-10-18 LAB — HEPARIN LEVEL (UNFRACTIONATED)
HEPARIN UNFRACTIONATED: 0.57 [IU]/mL (ref 0.30–0.70)
Heparin Unfractionated: 0.33 IU/mL (ref 0.30–0.70)

## 2014-10-18 LAB — BASIC METABOLIC PANEL
ANION GAP: 9 (ref 5–15)
BUN: 20 mg/dL (ref 6–20)
CHLORIDE: 104 mmol/L (ref 101–111)
CO2: 27 mmol/L (ref 22–32)
CREATININE: 1.48 mg/dL — AB (ref 0.44–1.00)
Calcium: 8.6 mg/dL — ABNORMAL LOW (ref 8.9–10.3)
GFR calc non Af Amer: 38 mL/min — ABNORMAL LOW (ref >60)
GFR, EST AFRICAN AMERICAN: 44 mL/min — AB (ref >60)
Glucose, Bld: 93 mg/dL (ref 65–99)
POTASSIUM: 3.3 mmol/L — AB (ref 3.5–5.1)
SODIUM: 140 mmol/L (ref 135–145)

## 2014-10-18 MED ORDER — DILTIAZEM HCL 100 MG IV SOLR
5.0000 mg/h | INTRAVENOUS | Status: DC
  Administered 2014-10-19: 5 mg/h via INTRAVENOUS
  Filled 2014-10-18 (×2): qty 100

## 2014-10-18 MED ORDER — DILTIAZEM HCL ER COATED BEADS 120 MG PO CP24
120.0000 mg | ORAL_CAPSULE | Freq: Every day | ORAL | Status: DC
  Administered 2014-10-18: 120 mg via ORAL
  Filled 2014-10-18: qty 1

## 2014-10-18 MED ORDER — POTASSIUM CHLORIDE CRYS ER 20 MEQ PO TBCR
40.0000 meq | EXTENDED_RELEASE_TABLET | Freq: Once | ORAL | Status: AC
  Administered 2014-10-18: 40 meq via ORAL
  Filled 2014-10-18: qty 2

## 2014-10-18 MED ORDER — AMIODARONE HCL 200 MG PO TABS
200.0000 mg | ORAL_TABLET | Freq: Two times a day (BID) | ORAL | Status: DC
  Administered 2014-10-18 – 2014-10-19 (×3): 200 mg via ORAL
  Filled 2014-10-18 (×3): qty 1

## 2014-10-18 MED ORDER — METOPROLOL TARTRATE 50 MG PO TABS
50.0000 mg | ORAL_TABLET | Freq: Two times a day (BID) | ORAL | Status: DC
  Administered 2014-10-18 – 2014-10-19 (×2): 50 mg via ORAL
  Filled 2014-10-18 (×2): qty 1

## 2014-10-18 NOTE — Progress Notes (Signed)
*  PRELIMINARY RESULTS* Vascular Ultrasound Left lower extremity venous duplex has been completed.  Preliminary findings: negative for DVT.  Farrel Demark, RDMS, RVT  10/18/2014, 11:58 AM

## 2014-10-18 NOTE — Progress Notes (Addendum)
Patient ID: [name] [name], female   DOB: 07-08-1955, 59 y.o.   MRN: [id number]   SUBJECTIVE: Feeling much better this morning.  Weight down considerably with diuresis (think we did not get accurate I/Os).  Actually converted spontaneously to NSR.    Scheduled Meds: . amiodarone  200 mg Oral BID  . B-complex with vitamin C  1 tablet Oral Daily  . cholecalciferol  2,000 Units Oral Daily  . diltiazem  120 mg Oral Daily  . feeding supplement (ENSURE ENLIVE)  237 mL Oral TID BM  . furosemide  80 mg Intravenous BID  . magnesium chloride  1 tablet Oral BID  . metoprolol  50 mg Oral BID  . pantoprazole  40 mg Oral Daily  . potassium chloride  40 mEq Oral Once  . pravastatin  40 mg Oral Daily  . sodium chloride  3 mL Intravenous Q12H   Continuous Infusions: . heparin 1,350 Units/hr (10/18/14 0642)   PRN Meds:.sodium chloride, acetaminophen, benzonatate, mometasone-formoterol, morphine, ondansetron (ZOFRAN) IV, sodium chloride    Filed Vitals:   10/18/14 0300 10/18/14 0400 10/18/14 0500 10/18/14 0600  BP: 100/59   93/69  Pulse: 74 76 74 77  Temp: 98 F (36.7 C)     TempSrc: Oral     Resp: Height:      Weight: 235 lb 12.8 oz (106.958 kg)     SpO2: 95% 94% 95% 90%    Intake/Output Summary (Last 24 hours) at 10/18/14 0731 Last data filed at 10/18/14 0600  Gross per 24 hour  Intake 645.75 ml  Output   1525 ml  Net -879.25 ml    LABS: Basic Metabolic Panel:  Recent Labs  99/37/16 1415 10/18/14 0233  NA 141 140  K 3.9 3.3*  CL 108 104  CO2 23 27  GLUCOSE 81 93  BUN 18 20  CREATININE 1.50* 1.48*  CALCIUM 9.2 8.6*   Liver Function Tests:  Recent Labs  10/22/2014 1415  AST 47*  ALT 46  ALKPHOS 123  BILITOT 1.1  PROT 6.9  ALBUMIN 3.2*   No results for input(s): LIPASE, AMYLASE in the last 72 hours. CBC:  Recent Labs  11/05/2014 1415  WBC 8.6  HGB 13.7  HCT 43.0  MCV 89.6  PLT 206   Cardiac Enzymes: No results for input(s): CKTOTAL, CKMB,  CKMBINDEX, TROPONINI in the last 72 hours. BNP: Invalid input(s): POCBNP D-Dimer: No results for input(s): DDIMER in the last 72 hours. Hemoglobin A1C: No results for input(s): HGBA1C in the last 72 hours. Fasting Lipid Panel: No results for input(s): CHOL, HDL, LDLCALC, TRIG, CHOLHDL, LDLDIRECT in the last 72 hours. Thyroid Function Tests: No results for input(s): TSH, T4TOTAL, T3FREE, THYROIDAB in the last 72 hours.  Invalid input(s): FREET3 Anemia Panel: No results for input(s): VITAMINB12, FOLATE, FERRITIN, TIBC, IRON, RETICCTPCT in the last 72 hours.  RADIOLOGY: No results found.  PHYSICAL EXAM General: NAD Neck: JVP 16 cm, no thyromegaly or thyroid nodule.  Lungs: Clear to auscultation bilaterally with normal respiratory effort. CV: Nondisplaced PMI.  Heart regular S1/S2, no S3/S4, 3/6 SEM RUSB, 1/6 diastolic murmur.  1+ edema to knee on left.   Abdomen: Soft, nontender, no hepatosplenomegaly, no distention.  Neurologic: Alert and oriented x 3.  Psych: Normal affect. Extremities: No clubbing or cyanosis.   TELEMETRY: Reviewed telemetry pt in NSR  ASSESSMENT AND PLAN: 59 yo with severe rheumatic mitral stenosis, pulmonary hypertension, RV failure, atrial fibrillation, and very large LA  thrombus was admitted with atrial fibrillation/RVR and acute/chronic diastolic CHF.  1. Acute on chronic diastolic CHF: Patient was severely volume overloaded in the setting of severe mitral stenosis with pulmonary hypertension and RV failure. LV EF 50% with D-shaped IV septum and moderately dilated RV with mild to moderate systolic dysfunction. She has NYHA class IIIb-IV symptoms at home. Weight down considerably with diuresis, creatinine stable. Still with volume overload.  - Continue Lasix 80 mg IV bid for now.  2. Atrial fibrillation: With RVR. Large LA thrombus, does not appear to have improved with several weeks of anticoagulation (though she has been subtherapeutic at times). She  actually spontaneously converted to NSR overnight.  - Heparin gtt for now, hold coumadin for surgery.  - Stop diltiazem gtt, use diltiazem CD 120 mg daily.  - Add amiodarone 200 mg bid and decrease metoprolol to 50 mg bid.  Will try to maintain NSR now that she is in it.  - ?Maze + LA clot removal at surgery.   3. Mitral stenosis: Severe by TEE, likely rheumatic. Needs valve replacement. She additionally has mild-moderate aortic stenosis. Discussed with Dr Cornelius Moras => very large LA thrombus appears unlikely to resolve any time soon with anticoagulation. Plan valve surgery next Wednesday along with thrombus retrieval and ?Maze. She has had cath showing no CAD. Plan to keep in hospital until then.  4. Left renal artery infarction: Creatinine stable around 1.5 with diuresis.  5. 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 PVR 5 WU with recent RHC. Suspect component of PAH from reactive pulmonary vascular changes in face of long-standing MS.  6. Aortic stenosis: Unable to complete evaluation of aortic valve on TEE yesterday due to patient's dyspnea and need to end procedure.  Will get limited echo for aortic valve gradient and evaluation today.  7. Asymmetric left leg swelling: Will get venous dopplers though she has been anticoagulated given marked asymmetry.   Marca Ancona 10/18/2014 7:37 AM

## 2014-10-18 NOTE — Telephone Encounter (Signed)
Yes, I know she is there.  Dr. Cornelius Moras and I have been discussing her care.  I'm glad we are going forward with surgery.

## 2014-10-18 NOTE — Progress Notes (Signed)
ANTICOAGULATION CONSULT NOTE - Follow Up Consult  Pharmacy Consult for Heparin Indication: atrial fibrillation and LA thrombus  Allergies  Allergen Reactions  . Codeine Itching    Patient Measurements: Height: 5' (152.4 cm) Weight: 235 lb 12.8 oz (106.958 kg) IBW/kg (Calculated) : 45.5 Heparin Dosing Weight: 74 kg  Vital Signs: Temp: 97.9 F (36.6 C) (10/06 0748) Temp Source: Oral (10/06 0748) BP: 106/84 mmHg (10/06 0907) Pulse Rate: 82 (10/06 0907)  Labs:  Recent Labs  10/15/14 1435 10/15/14 1452 11/12/2014 1415 10/26/2014 1503 10/22/2014 2203 10/18/14 0233 10/18/14 0823  HGB  --   --  13.7  --   --   --   --   HCT  --   --  43.0  --   --   --   --   PLT  --   --  206  --   --   --   --   LABPROT  --   --   --  23.3*  --  24.1*  --   INR 1.7  --   --  2.09*  --  2.18*  --   HEPARINUNFRC  --   --   --   --  0.20* 0.33 0.57  CREATININE  --  1.22* 1.50*  --   --  1.48*  --     Estimated Creatinine Clearance: 45.3 mL/min (by C-G formula based on Cr of 1.48).  Assessment:  Continues on IV heparin for atrial fibrillation and LA thrombus. Also hx infarct to left kidney 09/03/14.   Heparin level is therapeutic (0.57) on 1350 units/hr.  INR is also therapeutic (2.18). Coumadin on hold for surgery on 11/04/2014.  Last Coumadin dose 10/4 at home.  Home Coumadin regimen: 3.75 mg daily except 7.5 mg on Mondays. Regimen just modified on 10/15/14. INRs had been labile since discharged 09/18/14.    K+ 3.3 today, KCL 40 mEq x 1 given. Also on Slow-Mag BID as at home.  Goal of Therapy:  Heparin level 0.3-0.7 units/ml Monitor platelets by anticoagulation protocol: Yes   Plan:   Continue heparin drip at 1350 units/hr.  Daily heparin level, CBC, PT/INR.  Coumadin on hold.  Will add Magnesium to am labs.  Dennie Fetters, RPh Pager: [phone] 10/18/2014,10:08 AM

## 2014-10-18 NOTE — Telephone Encounter (Signed)
Pt was returning my call regarding her labwork.  She had her TEE and they decided to admit her into the hospital.  Pt wanted me to let Norma Fredrickson, NP know that she was going to have surgery 11/01/2014.  Message will be fwd to [place].

## 2014-10-18 NOTE — Progress Notes (Signed)
Dr Shirlee Latch notified of At Fib, awaiting orders

## 2014-10-18 NOTE — Progress Notes (Signed)
ANTICOAGULATION CONSULT NOTE -Consult  Pharmacy Consult for Heparin Indication: atrial fibrillation and LA thrombus  Allergies  Allergen Reactions  . Codeine Itching    Patient Measurements: Height: 5' (152.4 cm) Weight: 235 lb 12.8 oz (106.958 kg) IBW/kg (Calculated) : 45.5 Heparin Dosing Weight: 74.4 kg  Vital Signs: Temp: 98 F (36.7 C) (10/06 0300) Temp Source: Oral (10/06 0300) BP: 100/59 mmHg (10/06 0300) Pulse Rate: 74 (10/06 0300)  Labs:  Recent Labs  10/15/14 1435 10/15/14 1452 11/06/2014 1415 10/18/2014 1503 11/01/2014 2203 10/18/14 0233  HGB  --   --  13.7  --   --   --   HCT  --   --  43.0  --   --   --   PLT  --   --  206  --   --   --   LABPROT  --   --   --  23.3*  --  24.1*  INR 1.7  --   --  2.09*  --  2.18*  HEPARINUNFRC  --   --   --   --  0.20* 0.33  CREATININE  --  1.22* 1.50*  --   --  1.48*    Estimated Creatinine Clearance: 45.3 mL/min (by C-G formula based on Cr of 1.48).  Assessment: 34  yof continues on IV heparin for atrial fibrillation and LA thrombus. Heparin level is now at goal but level was drawn several hours early. No bleeding noted.    Goal of Therapy:  Heparin level 0.3-0.7 units/ml Monitor platelets by anticoagulation protocol: Yes   Plan:  - Continue heparin gtt 1350 units/hr - Recheck heparin level in 6 hours  Lysle Pearl, PharmD, BCPS Pager # [phone] 10/18/2014 4:33 AM

## 2014-10-18 NOTE — Progress Notes (Signed)
CARDIAC REHAB PHASE I   PRE:  Rate/Rhythm: 84 SR  BP:  Supine:   Sitting: 117/79  Standing:    SaO2: 96-97%RA  MODE:  Ambulation: 220 ft   POST:  Rate/Rhythm: 112 ST  BP:  Supine:   Sitting: 135/81  Standing:    SaO2: 93%RA 1330-1357 Pt walked 220 ft with rollator and asst x 2 with steady gait. Stopped three times to rest. Pt very motivated to walk. To recliner after walk.  Remained in SR.   Luetta Nutting, RN BSN  10/18/2014 1:51 PM

## 2014-10-19 ENCOUNTER — Inpatient Hospital Stay (HOSPITAL_COMMUNITY): Payer: Commercial Managed Care - HMO

## 2014-10-19 DIAGNOSIS — I350 Nonrheumatic aortic (valve) stenosis: Secondary | ICD-10-CM

## 2014-10-19 DIAGNOSIS — I272 Other secondary pulmonary hypertension: Secondary | ICD-10-CM

## 2014-10-19 LAB — BASIC METABOLIC PANEL
Anion gap: 9 (ref 5–15)
BUN: 20 mg/dL (ref 6–20)
CHLORIDE: 100 mmol/L — AB (ref 101–111)
CO2: 31 mmol/L (ref 22–32)
Calcium: 8.8 mg/dL — ABNORMAL LOW (ref 8.9–10.3)
Creatinine, Ser: 1.4 mg/dL — ABNORMAL HIGH (ref 0.44–1.00)
GFR calc non Af Amer: 40 mL/min — ABNORMAL LOW (ref >60)
GFR, EST AFRICAN AMERICAN: 47 mL/min — AB (ref >60)
Glucose, Bld: 106 mg/dL — ABNORMAL HIGH (ref 65–99)
POTASSIUM: 3.6 mmol/L (ref 3.5–5.1)
SODIUM: 140 mmol/L (ref 135–145)

## 2014-10-19 LAB — CBC
HEMATOCRIT: 37.7 % (ref 36.0–46.0)
HEMOGLOBIN: 12.1 g/dL (ref 12.0–15.0)
MCH: 28.7 pg (ref 26.0–34.0)
MCHC: 32.1 g/dL (ref 30.0–36.0)
MCV: 89.5 fL (ref 78.0–100.0)
Platelets: 213 10*3/uL (ref 150–400)
RBC: 4.21 MIL/uL (ref 3.87–5.11)
RDW: 17.4 % — ABNORMAL HIGH (ref 11.5–15.5)
WBC: 6.8 10*3/uL (ref 4.0–10.5)

## 2014-10-19 LAB — URINALYSIS, ROUTINE W REFLEX MICROSCOPIC
Bilirubin Urine: NEGATIVE
Glucose, UA: NEGATIVE mg/dL
Hgb urine dipstick: NEGATIVE
KETONES UR: NEGATIVE mg/dL
LEUKOCYTES UA: NEGATIVE
NITRITE: NEGATIVE
PROTEIN: NEGATIVE mg/dL
Specific Gravity, Urine: 1.009 (ref 1.005–1.030)
Urobilinogen, UA: 1 mg/dL (ref 0.0–1.0)
pH: 7.5 (ref 5.0–8.0)

## 2014-10-19 LAB — HEPARIN LEVEL (UNFRACTIONATED): HEPARIN UNFRACTIONATED: 0.59 [IU]/mL (ref 0.30–0.70)

## 2014-10-19 LAB — PROTIME-INR
INR: 2.03 — AB (ref 0.00–1.49)
Prothrombin Time: 22.8 seconds — ABNORMAL HIGH (ref 11.6–15.2)

## 2014-10-19 LAB — MAGNESIUM: MAGNESIUM: 2 mg/dL (ref 1.7–2.4)

## 2014-10-19 MED ORDER — AMIODARONE LOAD VIA INFUSION
150.0000 mg | Freq: Once | INTRAVENOUS | Status: AC
  Administered 2014-10-19: 150 mg via INTRAVENOUS
  Filled 2014-10-19: qty 83.34

## 2014-10-19 MED ORDER — AMIODARONE HCL IN DEXTROSE 360-4.14 MG/200ML-% IV SOLN
30.0000 mg/h | INTRAVENOUS | Status: DC
  Administered 2014-10-20 – 2014-10-25 (×7): 30 mg/h via INTRAVENOUS
  Filled 2014-10-19 (×8): qty 200

## 2014-10-19 MED ORDER — METOPROLOL TARTRATE 25 MG PO TABS
25.0000 mg | ORAL_TABLET | Freq: Two times a day (BID) | ORAL | Status: DC
  Administered 2014-10-20 – 2014-10-22 (×5): 25 mg via ORAL
  Filled 2014-10-19 (×5): qty 1

## 2014-10-19 MED ORDER — DILTIAZEM HCL ER COATED BEADS 120 MG PO CP24
120.0000 mg | ORAL_CAPSULE | Freq: Every day | ORAL | Status: DC
  Administered 2014-10-19: 120 mg via ORAL
  Filled 2014-10-19: qty 1

## 2014-10-19 MED ORDER — AMIODARONE HCL IN DEXTROSE 360-4.14 MG/200ML-% IV SOLN
60.0000 mg/h | INTRAVENOUS | Status: DC
  Administered 2014-10-19 – 2014-10-20 (×2): 60 mg/h via INTRAVENOUS
  Filled 2014-10-19 (×2): qty 200

## 2014-10-19 NOTE — Progress Notes (Signed)
CARDIAC REHAB PHASE I   PRE:  Rate/Rhythm: 76 SR    BP: sitting 130/75    SaO2: 96 RA  MODE:  Ambulation: 200 ft   POST:  Rate/Rhythm: 96 SR    BP: sitting 116/73     SaO2: 96 RA  Pt eager to walk. Likes a taller RW than what is normally adjusted for her height. Able to stand well and walk independently. Rest every 50 ft for dyspnea. Reminded pt not to push too hard, its ok to rest. To recliner after walk, VSS. Will continue to follow. [id number]   Elissa Lovett Crystal CES, ACSM 10/19/2014 11:23 AM

## 2014-10-19 NOTE — Progress Notes (Signed)
ANTICOAGULATION CONSULT NOTE - Follow Up Consult  Pharmacy Consult for Heparin Indication: atrial fibrillation and LA thrombus  Allergies  Allergen Reactions  . Codeine Itching    Patient Measurements: Height: 5' (152.4 cm) Weight: 235 lb 6.4 oz (106.777 kg) IBW/kg (Calculated) : 45.5 Heparin Dosing Weight: 74 kg  Vital Signs: Temp: 98.1 F (36.7 C) (10/07 0747) Temp Source: Oral (10/07 0747) BP: 120/65 mmHg (10/07 0747)  Labs:  Recent Labs  October 22, 2014 1415 10-22-14 1503  10/18/14 0233 10/18/14 0823 10/19/14 0257  HGB 13.7  --   --   --   --  12.1  HCT 43.0  --   --   --   --  37.7  PLT 206  --   --   --   --  213  LABPROT  --  23.3*  --  24.1*  --  22.8*  INR  --  2.09*  --  2.18*  --  2.03*  HEPARINUNFRC  --   --   < > 0.33 0.57 0.59  CREATININE 1.50*  --   --  1.48*  --  1.40*  < > = values in this interval not displayed.  Estimated Creatinine Clearance: 47.8 mL/min (by C-G formula based on Cr of 1.4).  Assessment:  Continues on IV heparin for atrial fibrillation and LA thrombus. Also hx infarct to left kidney 09/03/14.   Heparin level remains therapeutic (0.59) on 1350 units/hr.  INR is also low therapeutic (2.03), but decreasing. Coumadin on hold for surgery on 10/31/2014.  Last Coumadin dose 10/4 at home.  Home Coumadin regimen: 3.75 mg daily except 7.5 mg on Mondays. Regimen just modified on 10/15/14. INRs had been labile since discharged 09/18/14.   Goal of Therapy:  Heparin level 0.3-0.7 units/ml Monitor platelets by anticoagulation protocol: Yes   Plan:   Continue heparin drip at 1350 units/hr.  Daily heparin level, CBC, PT/INR.  Coumadin on hold.  Dennie Fetters, RPh Pager: [phone] 10/19/2014,9:33 AM

## 2014-10-19 NOTE — Consult Note (Signed)
  Amiodarone Drug - Drug Interaction Consult Note  Recommendations:  Amiodarone is metabolized by the cytochrome P450 system and therefore has the potential to cause many drug interactions. Amiodarone has an average plasma half-life of 50 days (range 20 to 100 days).   There is potential for drug interactions to occur several weeks or months after stopping treatment and the onset of drug interactions may be slow after initiating amiodarone.    Statins: Increased risk of myopathy. Simvastatin- restrict dose to  daily. Other statins: counsel patients to report any muscle pain or weakness immediately.   Anticoagulants: Amiodarone can increase anticoagulant effect. Consider warfarin dose reduction. Patients should be monitored closely and the dose of anticoagulant altered accordingly, remembering that amiodarone levels take several weeks to stabilize.    Antiepileptics: Amiodarone can increase plasma concentration of phenytoin, the dose should be reduced. Note that small changes in phenytoin dose can result in large changes in levels. Monitor patient and counsel on signs of toxicity.   Beta blockers: increased risk of bradycardia, AV block and myocardial depression. Sotalol - avoid concomitant use.    Calcium channel blockers (diltiazem and verapamil): increased risk of bradycardia, AV block and myocardial depression.    Cyclosporine: Amiodarone increases levels of cyclosporine. Reduced dose of cyclosporine is recommended.   Digoxin dose should be halved when amiodarone is started.   Diuretics: increased risk of cardiotoxicity if hypokalemia occurs.   Oral hypoglycemic agents (glyburide, glipizide, glimepiride): increased risk of hypoglycemia. Patient's glucose levels should be monitored closely when initiating amiodarone therapy.    Drugs that prolong the QT interval:  Torsades de pointes risk may be increased with concurrent use - avoid if possible.  Monitor QTc,  also keep magnesium/potassium WNL if concurrent therapy can't be avoided. Marland Kitchen Antibiotics: e.g. fluoroquinolones, erythromycin. . Antiarrhythmics: e.g. quinidine, procainamide, disopyramide, sotalol. . Antipsychotics: e.g. phenothiazines, haloperidol.  . Lithium, tricyclic antidepressants, and methadone.  Thank You,   Velda Shell,  Pharm.D   10/19/2014,  8:44 PM

## 2014-10-19 NOTE — Progress Notes (Signed)
Patient ID: [name] [name], female   DOB: [birth year], 59 y.o.   MRN: [id number]   SUBJECTIVE:  Denies dyspnea. I/O negative another 2.5L but weight unchanged.  Echo today with mild to moderate AS (mean gradient 18 mmHG)  Walked with CR today and went into AF but now back in NSR.   Scheduled Meds: . amiodarone  200 mg Oral BID  . B-complex with vitamin C  1 tablet Oral Daily  . cholecalciferol  2,000 Units Oral Daily  . diltiazem  120 mg Oral Daily  . feeding supplement (ENSURE ENLIVE)  237 mL Oral TID BM  . furosemide  80 mg Intravenous BID  . magnesium chloride  1 tablet Oral BID  . metoprolol  50 mg Oral BID  . pantoprazole  40 mg Oral Daily  . pravastatin  40 mg Oral Daily  . sodium chloride  3 mL Intravenous Q12H   Continuous Infusions: . heparin 1,350 Units/hr (10/19/14 0800)   PRN Meds:.sodium chloride, acetaminophen, benzonatate, mometasone-formoterol, morphine, ondansetron (ZOFRAN) IV, sodium chloride    Filed Vitals:   10/19/14 0800 10/19/14 1141 10/19/14 1200 10/19/14 1600  BP: 126/110 116/73 114/63 97/68  Pulse:      Temp:  97.6 F (36.4 C)  97.6 F (36.4 C)  TempSrc:  Oral  Oral  Resp: Height:      Weight:      SpO2:  98%  97%    Intake/Output Summary (Last 24 hours) at 10/19/14 2008 Last data filed at 10/19/14 1800  Gross per 24 hour  Intake   1622 ml  Output   2825 ml  Net  -1203 ml    LABS: Basic Metabolic Panel:  Recent Labs  40/98/11 0233 10/19/14 0257  NA 140 140  K 3.3* 3.6  CL 104 100*  CO2 27 31  GLUCOSE 93 106*  BUN 20 20  CREATININE 1.48* 1.40*  CALCIUM 8.6* 8.8*  MG  --  2.0   Liver Function Tests:  Recent Labs  11/11/2014 1415  AST 47*  ALT 46  ALKPHOS 123  BILITOT 1.1  PROT 6.9  ALBUMIN 3.2*   No results for input(s): LIPASE, AMYLASE in the last 72 hours. CBC:  Recent Labs  10/13/2014 1415 10/19/14 0257  WBC 8.6 6.8  HGB 13.7 12.1  HCT 43.0 37.7  MCV 89.6 89.5  PLT 206 213   Cardiac  Enzymes: No results for input(s): CKTOTAL, CKMB, CKMBINDEX, TROPONINI in the last 72 hours. BNP: Invalid input(s): POCBNP D-Dimer: No results for input(s): DDIMER in the last 72 hours. Hemoglobin A1C: No results for input(s): HGBA1C in the last 72 hours. Fasting Lipid Panel: No results for input(s): CHOL, HDL, LDLCALC, TRIG, CHOLHDL, LDLDIRECT in the last 72 hours. Thyroid Function Tests: No results for input(s): TSH, T4TOTAL, T3FREE, THYROIDAB in the last 72 hours.  Invalid input(s): FREET3 Anemia Panel: No results for input(s): VITAMINB12, FOLATE, FERRITIN, TIBC, IRON, RETICCTPCT in the last 72 hours.  RADIOLOGY: No results found.  PHYSICAL EXAM General: NAD Neck: JVP 8-9 cm, no thyromegaly or thyroid nodule.  Lungs: Clear to auscultation bilaterally with normal respiratory effort. CV: Nondisplaced PMI.  Heart regular S1/S2, no S3/S4, 3/6 SEM RUSB, 1/6 diastolic murmur.  Tr edema to knee on left.   Abdomen: Obese. soft, nontender, no hepatosplenomegaly, no distention.  Neurologic: Alert and oriented x 3.  Psych: Normal affect. Extremities: No clubbing or cyanosis.   TELEMETRY: Reviewed telemetry pt in NSR currently  ASSESSMENT AND  PLAN: 59 yo with severe rheumatic mitral stenosis, pulmonary hypertension, RV failure, atrial fibrillation, and very large LA thrombus was admitted with atrial fibrillation/RVR and acute/chronic diastolic CHF.  1. Acute on chronic diastolic CHF: Patient was severely volume overloaded in the setting of severe mitral stenosis with pulmonary hypertension and RV failure. LV EF 50% with D-shaped IV septum and moderately dilated RV with moderate to severe systolic dysfunction. She has NYHA class IIIb-IV symptoms at home. Weight down considerably with diuresis, creatinine stable. Still with volume overload.  - Continue Lasix 80 mg IV bid for now.  2. Atrial fibrillation:  Large LA thrombus, does not appear to have improved with several weeks of  anticoagulation (though she has been subtherapeutic at times). She actually spontaneously converted to NSR yesterday and was started on amio. Had episode of AF today but back in NSR now.  - Heparin gtt for now, hold coumadin for surgery.  - Will switch amio to IV to try and maintain NSR. Stop diltiazem and decrease metoprolol to 20 mg bid.   - ?Maze + LA clot removal at surgery.   3. Mitral stenosis: Severe by TEE, likely rheumatic. Needs valve replacement. She additionally has mild-moderate aortic stenosis. Discussed with Dr Cornelius Moras => very large LA thrombus appears unlikely to resolve any time soon with anticoagulation. Plan valve surgery next Wednesday along with thrombus retrieval and ?Maze. She has had cath showing no CAD. Plan to keep in hospital until then.  4. Left renal artery infarction: Creatinine stable around 1.5 with diuresis.  5. 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 PAPs ~80. PVR 5 WU with recent RHC. Suspect component of PAH from reactive pulmonary vascular changes in face of long-standing MS.  6. Aortic stenosis: Mild to moderate on echo today 7. Asymmetric left leg swelling: Dopplers negative 10/6  Volume status much improved. Will continue IV lasix one more day and then switch to po. Will start IV amio to try and maintain NSR. I have reviewed RHC from last month and PA pressures very high and suspect MVR will be quite high risk. Will discuss starting pre-op milrinone with Dr. Shirlee Latch to help bring PA pressures down. Continue heparin.   Arvilla Meres MD 10/19/2014 8:08 PM

## 2014-10-19 NOTE — Progress Notes (Signed)
  Echocardiogram 2D Echocardiogram Limited has been performed.  Leta Jungling M 10/19/2014, 1:02 PM

## 2014-10-20 DIAGNOSIS — I2609 Other pulmonary embolism with acute cor pulmonale: Secondary | ICD-10-CM

## 2014-10-20 LAB — CBC
HEMATOCRIT: 38.2 % (ref 36.0–46.0)
Hemoglobin: 12.4 g/dL (ref 12.0–15.0)
MCH: 28.9 pg (ref 26.0–34.0)
MCHC: 32.5 g/dL (ref 30.0–36.0)
MCV: 89 fL (ref 78.0–100.0)
Platelets: 228 10*3/uL (ref 150–400)
RBC: 4.29 MIL/uL (ref 3.87–5.11)
RDW: 17.4 % — AB (ref 11.5–15.5)
WBC: 6.3 10*3/uL (ref 4.0–10.5)

## 2014-10-20 LAB — PROTIME-INR
INR: 1.54 — AB (ref 0.00–1.49)
Prothrombin Time: 18.6 seconds — ABNORMAL HIGH (ref 11.6–15.2)

## 2014-10-20 LAB — BASIC METABOLIC PANEL
Anion gap: 10 (ref 5–15)
BUN: 19 mg/dL (ref 6–20)
CHLORIDE: 99 mmol/L — AB (ref 101–111)
CO2: 31 mmol/L (ref 22–32)
CREATININE: 1.49 mg/dL — AB (ref 0.44–1.00)
Calcium: 9.1 mg/dL (ref 8.9–10.3)
GFR calc non Af Amer: 37 mL/min — ABNORMAL LOW (ref >60)
GFR, EST AFRICAN AMERICAN: 43 mL/min — AB (ref >60)
GLUCOSE: 102 mg/dL — AB (ref 65–99)
Potassium: 3.6 mmol/L (ref 3.5–5.1)
Sodium: 140 mmol/L (ref 135–145)

## 2014-10-20 LAB — HEPARIN LEVEL (UNFRACTIONATED): Heparin Unfractionated: 0.58 IU/mL (ref 0.30–0.70)

## 2014-10-20 LAB — PREALBUMIN: Prealbumin: 12.6 mg/dL — ABNORMAL LOW (ref 18–38)

## 2014-10-20 MED ORDER — SILDENAFIL CITRATE 20 MG PO TABS
20.0000 mg | ORAL_TABLET | Freq: Three times a day (TID) | ORAL | Status: DC
  Administered 2014-10-20 – 2014-10-21 (×4): 20 mg via ORAL
  Filled 2014-10-20 (×4): qty 1

## 2014-10-20 MED ORDER — TORSEMIDE 20 MG PO TABS
40.0000 mg | ORAL_TABLET | Freq: Every day | ORAL | Status: DC
  Administered 2014-10-20 – 2014-10-21 (×2): 40 mg via ORAL
  Filled 2014-10-20 (×2): qty 2

## 2014-10-20 NOTE — Progress Notes (Signed)
Patient ID: [name] [name], female   DOB: 03/08/1955, 59 y.o.   MRN: [id number]   SUBJECTIVE:  Feels great. Ambulating unit. Says cough has totally resolved.  Weight down another 2 pounds.  Echo with mild to moderate AS (mean gradient 18 mmHG)  Remains in NSR. Now on IV amio   Scheduled Meds: . B-complex with vitamin C  1 tablet Oral Daily  . cholecalciferol  2,000 Units Oral Daily  . feeding supplement (ENSURE ENLIVE)  237 mL Oral TID BM  . furosemide  80 mg Intravenous BID  . magnesium chloride  1 tablet Oral BID  . metoprolol  25 mg Oral BID  . pantoprazole  40 mg Oral Daily  . pravastatin  40 mg Oral Daily  . sodium chloride  3 mL Intravenous Q12H   Continuous Infusions: . amiodarone 30 mg/hr (10/20/14 0244)  . heparin 1,350 Units/hr (10/20/14 0000)   PRN Meds:.sodium chloride, acetaminophen, benzonatate, mometasone-formoterol, morphine, ondansetron (ZOFRAN) IV, sodium chloride    Filed Vitals:   10/20/14 0100 10/20/14 0417 10/20/14 0541 10/20/14 0837  BP:  112/68  121/84  Pulse:    76  Temp:  97.5 F (36.4 C)  98.6 F (37 C)  TempSrc:  Oral  Oral  Resp: Height:      Weight:   105.688 kg (233 lb)   SpO2:    93%    Intake/Output Summary (Last 24 hours) at 10/20/14 1022 Last data filed at 10/20/14 0900  Gross per 24 hour  Intake 1751.9 ml  Output   1750 ml  Net    1.9 ml    LABS: Basic Metabolic Panel:  Recent Labs  40/98/11 0257 10/20/14 0305  NA 140 140  K 3.6 3.6  CL 100* 99*  CO2 31 31  GLUCOSE 106* 102*  BUN 20 19  CREATININE 1.40* 1.49*  CALCIUM 8.8* 9.1  MG 2.0  --    Liver Function Tests:  Recent Labs  Nov 03, 2014 1415  AST 47*  ALT 46  ALKPHOS 123  BILITOT 1.1  PROT 6.9  ALBUMIN 3.2*   No results for input(s): LIPASE, AMYLASE in the last 72 hours. CBC:  Recent Labs  10/19/14 0257 10/20/14 0305  WBC 6.8 6.3  HGB 12.1 12.4  HCT 37.7 38.2  MCV 89.5 89.0  PLT 213 228   Cardiac Enzymes: No results for  input(s): CKTOTAL, CKMB, CKMBINDEX, TROPONINI in the last 72 hours. BNP: Invalid input(s): POCBNP D-Dimer: No results for input(s): DDIMER in the last 72 hours. Hemoglobin A1C: No results for input(s): HGBA1C in the last 72 hours. Fasting Lipid Panel: No results for input(s): CHOL, HDL, LDLCALC, TRIG, CHOLHDL, LDLDIRECT in the last 72 hours. Thyroid Function Tests: No results for input(s): TSH, T4TOTAL, T3FREE, THYROIDAB in the last 72 hours.  Invalid input(s): FREET3 Anemia Panel: No results for input(s): VITAMINB12, FOLATE, FERRITIN, TIBC, IRON, RETICCTPCT in the last 72 hours.  RADIOLOGY: No results found.  PHYSICAL EXAM General: NAD Neck: JVP 8-9 cm, no thyromegaly or thyroid nodule.  Lungs: Clear to auscultation bilaterally with normal respiratory effort. CV: Nondisplaced PMI.  Heart regular S1/S2, no S3/S4, 3/6 SEM RUSB, 1/6 diastolic murmur.  Tr edema to knee on left.   Abdomen: Obese. soft, nontender, no hepatosplenomegaly, no distention.  Neurologic: Alert and oriented x 3.  Psych: Normal affect. Extremities: No clubbing or cyanosis.   TELEMETRY: Reviewed telemetry pt in NSR currently  ASSESSMENT AND PLAN: 59 yo with severe rheumatic mitral  stenosis, pulmonary hypertension, RV failure, atrial fibrillation, and very large LA thrombus was admitted with atrial fibrillation/RVR and acute/chronic diastolic CHF.  1. Acute on chronic diastolic CHF: Patient was severely volume overloaded in the setting of severe mitral stenosis with pulmonary hypertension and RV failure. LV EF 50% with D-shaped IV septum and moderately dilated RV with moderate to severe systolic dysfunction. She has NYHA class IIIb-IV symptoms at home. Weight down considerably with diuresis, creatinine stable.  -volume status much improved. Switch to po diuretics 2. Atrial fibrillation:  Large LA thrombus, does not appear to have improved with several weeks of anticoagulation (though she has been  subtherapeutic at times). She actually spontaneously converted to NSR yesterday and was started on amio. Had episode of AF today but back in NSR now.  - Heparin gtt for now, hold coumadin for surgery.  - On IV amio to try and maintain NSR.   - ?Maze + LA clot removal at surgery.   3. Mitral stenosis: Severe by TEE, likely rheumatic. Needs valve replacement. She additionally has mild-moderate aortic stenosis. Discussed with Dr Cornelius Moras => very large LA thrombus appears unlikely to resolve any time soon with anticoagulation. Plan valve surgery next Wednesday along with thrombus retrieval and ?Maze. She has had cath showing no CAD. Plan to keep in hospital until then.  4. Left renal artery infarction: Creatinine stable around 1.5 with diuresis.  5. 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 PAPs ~80. PVR 5 WU with recent RHC. Suspect component of PAH from reactive pulmonary vascular changes in face of long-standing MS.  - given degree of PH and RV failure, suspect surgery will be high risk. Now that she has been diuresed will start low-dose sildenafil to help lower PA pressures for surgery. Consider addition of milrinone tomorrow once she has received more amio so as to reduce risk of recurrent AF. D/w Dr. Shirlee Latch  6. Aortic stenosis: Mild to moderate on echo 7. Asymmetric left leg swelling: Dopplers negative 10/6    [name] Meres MD 10/20/2014 10:22 AM

## 2014-10-20 NOTE — Progress Notes (Signed)
CARDIAC REHAB PHASE I   PRE:  Rate/Rhythm: 69  BP:  Sitting: 115/75     SaO2: 94% ra  MODE:  Ambulation: 220 ft   POST:  Rate/Rhythm: 73  BP:  Sitting: 116/74     SaO2: 92% ra  11:25am-12:05pm  Patient ambulated at a slow pace utilizing a walker. Patient needed to stop frequently due to leg fatigue. Patient did not complain of any pain and was in good spirits. Patient placed in chair with feet elevated and call bell in reach.   [name] [name], [place], [place] 10/20/2014 12:02 PM

## 2014-10-20 NOTE — Progress Notes (Signed)
ANTICOAGULATION CONSULT NOTE - Follow Up Consult  Pharmacy Consult for heparin Indication: atrial fibrillation and LA thrombus  Allergies  Allergen Reactions  . Codeine Itching    Patient Measurements: Height: 5' (152.4 cm) Weight: 233 lb (105.688 kg) IBW/kg (Calculated) : 45.5 Heparin Dosing Weight: 74 kg  Vital Signs: Temp: 98.6 F (37 C) (10/08 0837) Temp Source: Oral (10/08 0837) BP: 121/84 mmHg (10/08 0837) Pulse Rate: 76 (10/08 0837)  Labs:  Recent Labs  11/12/2014 1415  10/18/14 0233 10/18/14 0823 10/19/14 0257 10/20/14 0305  HGB 13.7  --   --   --  12.1 12.4  HCT 43.0  --   --   --  37.7 38.2  PLT 206  --   --   --  213 228  LABPROT  --   < > 24.1*  --  22.8* 18.6*  INR  --   < > 2.18*  --  2.03* 1.54*  HEPARINUNFRC  --   < > 0.33 0.57 0.59 0.58  CREATININE 1.50*  --  1.48*  --  1.40* 1.49*  < > = values in this interval not displayed.  Estimated Creatinine Clearance: 44.7 mL/min (by C-G formula based on Cr of 1.49).   Assessment: 59 yo f on heparin infusion for afib and LV thrombus. HL has been therapeutic x 3 on 1350 units/hr.  CBC stable, coumadin remains on hold.  Goal of Therapy:  Heparin level 0.3-0.7 units/ml Monitor platelets by anticoagulation protocol: Yes   Plan:  Continue heparin infusion at 1350 units/hr F/u HL/CBC/INR daily Monitor s/s of bleeding  Cassie L. Roseanne Reno, PharmD Clinical Pharmacy Resident Pager: [phone] 10/20/2014 9:49 AM

## 2014-10-21 LAB — CBC
HCT: 37.3 % (ref 36.0–46.0)
Hemoglobin: 12 g/dL (ref 12.0–15.0)
MCH: 28.5 pg (ref 26.0–34.0)
MCHC: 32.2 g/dL (ref 30.0–36.0)
MCV: 88.6 fL (ref 78.0–100.0)
PLATELETS: 203 10*3/uL (ref 150–400)
RBC: 4.21 MIL/uL (ref 3.87–5.11)
RDW: 17.2 % — AB (ref 11.5–15.5)
WBC: 6 10*3/uL (ref 4.0–10.5)

## 2014-10-21 LAB — BASIC METABOLIC PANEL
Anion gap: 8 (ref 5–15)
BUN: 18 mg/dL (ref 6–20)
CALCIUM: 8.9 mg/dL (ref 8.9–10.3)
CO2: 32 mmol/L (ref 22–32)
Chloride: 95 mmol/L — ABNORMAL LOW (ref 101–111)
Creatinine, Ser: 1.61 mg/dL — ABNORMAL HIGH (ref 0.44–1.00)
GFR calc Af Amer: 39 mL/min — ABNORMAL LOW (ref >60)
GFR, EST NON AFRICAN AMERICAN: 34 mL/min — AB (ref >60)
GLUCOSE: 94 mg/dL (ref 65–99)
Potassium: 3.6 mmol/L (ref 3.5–5.1)
SODIUM: 135 mmol/L (ref 135–145)

## 2014-10-21 LAB — HEPARIN LEVEL (UNFRACTIONATED): Heparin Unfractionated: 0.61 IU/mL (ref 0.30–0.70)

## 2014-10-21 LAB — PROTIME-INR
INR: 1.32 (ref 0.00–1.49)
Prothrombin Time: 16.5 seconds — ABNORMAL HIGH (ref 11.6–15.2)

## 2014-10-21 MED ORDER — SILDENAFIL CITRATE 20 MG PO TABS
40.0000 mg | ORAL_TABLET | Freq: Three times a day (TID) | ORAL | Status: DC
  Administered 2014-10-21 – 2014-10-22 (×5): 40 mg via ORAL
  Filled 2014-10-21 (×5): qty 2

## 2014-10-21 MED ORDER — AMIODARONE IV BOLUS ONLY 150 MG/100ML
150.0000 mg | Freq: Once | INTRAVENOUS | Status: AC
  Administered 2014-10-21: 150 mg via INTRAVENOUS

## 2014-10-21 NOTE — Progress Notes (Addendum)
Patient ID: [name] [name], female   DOB: [birth year], 59 y.o.   MRN: [id number]   SUBJECTIVE:  Feels great. Ambulating unit. Says cough has totally resolved.  Weight down another 25 pounds. Sildenafil started yesterday.  Had 4 hour burst of AF (controlled rate last night). Back in NSR this am. Remains on IV amio. Creatinine up slightly.     Echo with mild to moderate AS (mean gradient 18 mmHG)   Scheduled Meds: . B-complex with vitamin C  1 tablet Oral Daily  . cholecalciferol  2,000 Units Oral Daily  . feeding supplement (ENSURE ENLIVE)  237 mL Oral TID BM  . furosemide  80 mg Intravenous BID  . magnesium chloride  1 tablet Oral BID  . metoprolol  25 mg Oral BID  . pantoprazole  40 mg Oral Daily  . pravastatin  40 mg Oral Daily  . sildenafil  20 mg Oral TID  . sodium chloride  3 mL Intravenous Q12H  . torsemide  40 mg Oral Daily   Continuous Infusions: . amiodarone 30 mg/hr (10/21/14 0955)  . heparin 1,350 Units/hr (10/21/14 0800)   PRN Meds:.sodium chloride, acetaminophen, benzonatate, mometasone-formoterol, morphine, ondansetron (ZOFRAN) IV, sodium chloride    Filed Vitals:   10/21/14 0800 10/21/14 1000 10/21/14 1159 10/21/14 1200  BP: 112/73 102/71 89/61 100/68  Pulse:      Temp: 98.1 F (36.7 C)  97.8 F (36.6 C)   TempSrc: Oral  Oral   Resp: Height:      Weight:      SpO2: 94%  93%     Intake/Output Summary (Last 24 hours) at 10/21/14 1228 Last data filed at 10/21/14 1200  Gross per 24 hour  Intake  837.4 ml  Output   2450 ml  Net -1612.6 ml    LABS: Basic Metabolic Panel:  Recent Labs  40/98/11 0257 10/20/14 0305 10/21/14 0607  NA 140 140 135  K 3.6 3.6 3.6  CL 100* 99* 95*  CO2 31 31 32  GLUCOSE 106* 102* 94  BUN CREATININE 1.40* 1.49* 1.61*  CALCIUM 8.8* 9.1 8.9  MG 2.0  --   --    Liver Function Tests: No results for input(s): AST, ALT, ALKPHOS, BILITOT, PROT, ALBUMIN in the last 72 hours. No results for  input(s): LIPASE, AMYLASE in the last 72 hours. CBC:  Recent Labs  10/20/14 0305 10/21/14 0607  WBC 6.3 6.0  HGB 12.4 12.0  HCT 38.2 37.3  MCV 89.0 88.6  PLT 228 203   Cardiac Enzymes: No results for input(s): CKTOTAL, CKMB, CKMBINDEX, TROPONINI in the last 72 hours. BNP: Invalid input(s): POCBNP D-Dimer: No results for input(s): DDIMER in the last 72 hours. Hemoglobin A1C: No results for input(s): HGBA1C in the last 72 hours. Fasting Lipid Panel: No results for input(s): CHOL, HDL, LDLCALC, TRIG, CHOLHDL, LDLDIRECT in the last 72 hours. Thyroid Function Tests: No results for input(s): TSH, T4TOTAL, T3FREE, THYROIDAB in the last 72 hours.  Invalid input(s): FREET3 Anemia Panel: No results for input(s): VITAMINB12, FOLATE, FERRITIN, TIBC, IRON, RETICCTPCT in the last 72 hours.  RADIOLOGY: No results found.  PHYSICAL EXAM General: NAD Neck: JVP 6 cm, no thyromegaly or thyroid nodule.  Lungs: Clear to auscultation bilaterally with normal respiratory effort. CV: Nondisplaced PMI.  Heart regular S1/S2, no S3/S4, 3/6 SEM RUSB, 1/6 diastolic murmur.  No edema  Abdomen: Obese. soft, nontender, no hepatosplenomegaly, no distention.  Neurologic: Alert and oriented x  3.  Psych: Normal affect. Extremities: No clubbing or cyanosis.   TELEMETRY: Reviewed telemetry pt in NSR currently had several hours of AF with controlled rate last night  ASSESSMENT AND PLAN: 59 yo with severe rheumatic mitral stenosis, pulmonary hypertension, RV failure, atrial fibrillation, and very large LA thrombus was admitted with atrial fibrillation/RVR and acute/chronic diastolic CHF.  1. Acute on chronic diastolic CHF: Patient was severely volume overloaded in the setting of severe mitral stenosis with pulmonary hypertension and RV failure. LV EF 50% with D-shaped IV septum and moderately dilated RV with moderate to severe systolic dysfunction. She has NYHA class IIIb-IV symptoms at home. Weight down  considerably with diuresis, creatinine up slightly -Has been getting iV lasix and torsemide. Will stop diuretics for now. Resume torsemide 40 daily in next day or two.  2. Atrial fibrillation:  Large LA thrombus, does not appear to have improved with several weeks of anticoagulation (though she has been subtherapeutic at times). She actually spontaneously converted to NSR yesterday and was started on amio. Had episode of AF last night but back in NSR now.  - Heparin gtt for now, hold coumadin for surgery.  - On IV amio to try and maintain NSR.  Will rebolus.  - ?Maze + LA clot removal at surgery.   3. Mitral stenosis: Severe by TEE, likely rheumatic. Needs valve replacement. She additionally has mild-moderate aortic stenosis. Discussed with Dr Cornelius Moras => very large LA thrombus appears unlikely to resolve any time soon with anticoagulation. Plan valve surgeryWednesday along with thrombus retrieval and ?Maze. She has had cath showing no CAD. Plan to keep in hospital until then.  4. Left renal artery infarction: Creatinine up slightly with diuresis.  5. 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 PAPs ~80. PVR 5 WU with recent RHC. Suspect component of PAH from reactive pulmonary vascular changes in face of long-standing MS.  - given degree of PH and RV failure, suspect surgery will be high risk. Now that she has been diuresed have started low-dose sildenafil to help lower PA pressures for surgery. Will increase sildenafil today. Consider addition of milrinone tomorrow once she has received more amio so as to reduce risk of recurrent AF. D/w Dr. Shirlee Latch 6. Aortic stenosis: Mild to moderate on echo 7. Asymmetric left leg swelling: Dopplers negative 10/6    Arvilla Meres MD 10/21/2014 12:28 PM

## 2014-10-21 NOTE — Progress Notes (Signed)
ANTICOAGULATION CONSULT NOTE - Follow Up Consult  Pharmacy Consult for heparin Indication: atrial fibrillation and LV thrombus  Allergies  Allergen Reactions  . Codeine Itching    Patient Measurements: Height: 5' (152.4 cm) Weight: 228 lb 9.6 oz (103.692 kg) IBW/kg (Calculated) : 45.5 Heparin Dosing Weight: 74 kg  Vital Signs: Temp: 98.1 F (36.7 C) (10/09 0800) Temp Source: Oral (10/09 0800) BP: 112/73 mmHg (10/09 0800) Pulse Rate: 76 (10/09 0341)  Labs:  Recent Labs  10/19/14 0257 10/20/14 0305 10/21/14 0607  HGB 12.1 12.4 12.0  HCT 37.7 38.2 37.3  PLT 213 228 203  LABPROT 22.8* 18.6* 16.5*  INR 2.03* 1.54* 1.32  HEPARINUNFRC 0.59 0.58 0.61  CREATININE 1.40* 1.49* 1.61*    Assessment: 59 yo f on heparin for afib and LV thrombus. Per RN, heparin infusion was turned off last night for ~3 hours until 2130 and restarted back on 1350 units/hr.  HL this AM is therapeutic at 0.61 - no changes necessary.  No further issues and CBC stable.  Goal of Therapy:  Heparin level 0.3-0.7 units/ml Monitor platelets by anticoagulation protocol: Yes   Plan:  Continue heparin infusion at 1350 units/hr F/u AM HL Monitor for bleeding  Cassie L. Roseanne Reno, PharmD Clinical Pharmacy Resident Pager: [phone] 10/21/2014 10:34 AM

## 2014-10-21 NOTE — Progress Notes (Signed)
Patient went into Afib at 2110 with controlled rates of 88 to 110's. Amio gtt continues at  dose and Heparin gtt infusing also. Patient stated she could feel the change in her rhythm "It makes me feel like I have to cough.". Patient converted back to NSR at 0135. VSS thru out process. Will continue to monitor closely.

## 2014-10-22 ENCOUNTER — Ambulatory Visit: Payer: Commercial Managed Care - HMO | Admitting: Nurse Practitioner

## 2014-10-22 ENCOUNTER — Inpatient Hospital Stay (HOSPITAL_COMMUNITY): Payer: Commercial Managed Care - HMO

## 2014-10-22 DIAGNOSIS — I4891 Unspecified atrial fibrillation: Secondary | ICD-10-CM

## 2014-10-22 DIAGNOSIS — I350 Nonrheumatic aortic (valve) stenosis: Secondary | ICD-10-CM

## 2014-10-22 DIAGNOSIS — I340 Nonrheumatic mitral (valve) insufficiency: Secondary | ICD-10-CM

## 2014-10-22 DIAGNOSIS — I360 Nonrheumatic tricuspid (valve) stenosis: Secondary | ICD-10-CM

## 2014-10-22 LAB — CBC
HCT: 37.9 % (ref 36.0–46.0)
Hemoglobin: 12.2 g/dL (ref 12.0–15.0)
MCH: 28.4 pg (ref 26.0–34.0)
MCHC: 32.2 g/dL (ref 30.0–36.0)
MCV: 88.1 fL (ref 78.0–100.0)
Platelets: 228 10*3/uL (ref 150–400)
RBC: 4.3 MIL/uL (ref 3.87–5.11)
RDW: 17.1 % — ABNORMAL HIGH (ref 11.5–15.5)
WBC: 6.4 10*3/uL (ref 4.0–10.5)

## 2014-10-22 LAB — PROTIME-INR
INR: 1.25 (ref 0.00–1.49)
PROTHROMBIN TIME: 15.8 s — AB (ref 11.6–15.2)

## 2014-10-22 LAB — HEPARIN LEVEL (UNFRACTIONATED): Heparin Unfractionated: 0.48 IU/mL (ref 0.30–0.70)

## 2014-10-22 LAB — APTT: aPTT: 100 seconds — ABNORMAL HIGH (ref 24–37)

## 2014-10-22 LAB — BASIC METABOLIC PANEL
Anion gap: 8 (ref 5–15)
BUN: 21 mg/dL — ABNORMAL HIGH (ref 6–20)
CHLORIDE: 94 mmol/L — AB (ref 101–111)
CO2: 33 mmol/L — AB (ref 22–32)
Calcium: 9.1 mg/dL (ref 8.9–10.3)
Creatinine, Ser: 1.62 mg/dL — ABNORMAL HIGH (ref 0.44–1.00)
GFR calc non Af Amer: 34 mL/min — ABNORMAL LOW (ref >60)
GFR, EST AFRICAN AMERICAN: 39 mL/min — AB (ref >60)
Glucose, Bld: 84 mg/dL (ref 65–99)
Potassium: 3.8 mmol/L (ref 3.5–5.1)
SODIUM: 135 mmol/L (ref 135–145)

## 2014-10-22 MED ORDER — MILRINONE IN DEXTROSE 20 MG/100ML IV SOLN
0.1250 ug/kg/min | INTRAVENOUS | Status: DC
  Administered 2014-10-22 – 2014-10-23 (×2): 0.125 ug/kg/min via INTRAVENOUS
  Filled 2014-10-22 (×2): qty 100

## 2014-10-22 MED ORDER — POTASSIUM CHLORIDE CRYS ER 20 MEQ PO TBCR
40.0000 meq | EXTENDED_RELEASE_TABLET | Freq: Once | ORAL | Status: AC
  Administered 2014-10-22: 40 meq via ORAL
  Filled 2014-10-22: qty 2

## 2014-10-22 MED ORDER — TORSEMIDE 20 MG PO TABS
40.0000 mg | ORAL_TABLET | Freq: Every day | ORAL | Status: DC
  Administered 2014-10-22: 40 mg via ORAL
  Filled 2014-10-22: qty 2

## 2014-10-22 MED ORDER — AMIODARONE LOAD VIA INFUSION
150.0000 mg | Freq: Once | INTRAVENOUS | Status: AC
  Administered 2014-10-22: 150 mg via INTRAVENOUS
  Filled 2014-10-22: qty 83.34

## 2014-10-22 MED ORDER — AMIODARONE HCL IN DEXTROSE 360-4.14 MG/200ML-% IV SOLN
60.0000 mg/h | INTRAVENOUS | Status: AC
  Administered 2014-10-22: 60 mg/h via INTRAVENOUS

## 2014-10-22 MED ORDER — AMIODARONE HCL IN DEXTROSE 360-4.14 MG/200ML-% IV SOLN
30.0000 mg/h | INTRAVENOUS | Status: DC
  Administered 2014-10-23: 30 mg/h via INTRAVENOUS
  Filled 2014-10-22 (×4): qty 200

## 2014-10-22 NOTE — Progress Notes (Signed)
      [address]       [place] [address]             [phone]     CARDIOTHORACIC SURGERY PROGRESS NOTE  5 Days Post-Op  S/P Procedure(s) (LRB): TRANSESOPHAGEAL ECHOCARDIOGRAM (TEE) (N/A)  Subjective: Patient has done well over the last few days.  Breathing improved  Objective: Vital signs in last 24 hours: Temp:  [97.5 F (36.4 C)-98.5 F (36.9 C)] 98 F (36.7 C) (10/10 1610) Pulse Rate:  [88] 88 (10/09 2149) Cardiac Rhythm:  [-] Atrial flutter (10/10 1616) Resp:  [15-29] 29 (10/10 1610) BP: (92-132)/(50-79) 92/62 mmHg (10/10 1610) SpO2:  [91 %-97 %] 96 % (10/10 1610) Weight:  [102.74 kg (226 lb 8 oz)] 102.74 kg (226 lb 8 oz) (10/10 0400)  Physical Exam:  Rhythm:   Afib  Breath sounds: Few bibasilar crackles  Heart sounds:  irregular  Incisions:  n/a  Abdomen:  soft  Extremities:  Warm, well perfused   Intake/Output from previous day: 10/09 0701 - 10/10 0700 In: 1218.1 [P.O.:480; I.V.:738.1] Out: 3300 [Urine:3300] Intake/Output this shift: Total I/O In: 1321.6 [P.O.:960; I.V.:361.6] Out: 300 [Urine:300]  Lab Results:  Recent Labs  10/21/14 0607 10/22/14 0222  WBC 6.0 6.4  HGB 12.0 12.2  HCT 37.3 37.9  PLT 203 228   BMET:  Recent Labs  10/21/14 0607 10/22/14 0222  NA 135 135  K 3.6 3.8  CL 95* 94*  CO2 32 33*  GLUCOSE 94 84  BUN 18 21*  CREATININE 1.61* 1.62*  CALCIUM 8.9 9.1    CBG (last 3)  No results for input(s): GLUCAP in the last 72 hours. PT/INR:   Recent Labs  10/22/14 0500  LABPROT 15.8*  INR 1.25    CXR:  CHEST 2 VIEW  COMPARISON: None.  FINDINGS: Cardiomegaly with mild bilateral from interstitial prominence. Congestive heart failure cannot be excluded. Pneumonitis cannot be excluded. Right base subsegmental atelectasis. No pneumothorax.  IMPRESSION: 1. Cardiomegaly with mild by pulmonary interstitial prominence suggesting mild congestive heart failure. Interstitial pneumonitis cannot be  excluded.  2. Right base subsegmental atelectasis.   Electronically Signed  By: Maisie Fus Register  On: 10/22/2014 07:21   Assessment/Plan: S/P Procedure(s) (LRB): TRANSESOPHAGEAL ECHOCARDIOGRAM (TEE) (N/A)  For OR on Wednesday for mitral and aortic valve replacement using bioprosthetic tissue valves, removal of left atrial clot, tricuspid valve repair, and maze procedure.  I have again reviewed the indications, risks and potential benefits of surgery with the patient and her family.   Alternative treatment strategies were discussed. They understand and accept all potential risks of surgery including but not limited to risk of death, stroke or other neurologic complication, myocardial infarction, congestive heart failure, respiratory failure, renal failure, bleeding requiring transfusion and/or reexploration, arrhythmia, heart block or bradycardia requiring permanent pacemaker placement, infection or other wound complications, pneumonia, pleural and/or pericardial effusion, pulmonary embolus, aortic dissection or other major vascular complication, systemic embolization, or delayed complications related to valve repair or replacement including but not limited to structural valve deterioration and failure, thrombosis, embolization, endocarditis, or paravalvular leak.  All of their questions have been answered.   I spent in excess of 15 minutes during the conduct of this hospital encounter and >50% of this time involved direct face-to-face encounter with the patient for counseling and/or coordination of their care.   Purcell Nails, MD 10/22/2014 6:33 PM

## 2014-10-22 NOTE — Progress Notes (Signed)
ANTICOAGULATION CONSULT NOTE - Follow Up Consult  Pharmacy Consult for heparin Indication: atrial fibrillation and LV thrombus   Patient Measurements: Height: 5' (152.4 cm) Weight: 226 lb 8 oz (102.74 kg) IBW/kg (Calculated) : 45.5 Heparin Dosing Weight: 74 kg  Vital Signs: Temp: 98 F (36.7 C) (10/10 0810) Temp Source: Oral (10/10 0810) BP: 108/72 mmHg (10/10 0810) Pulse Rate: 88 (10/09 2149)  Labs:  Recent Labs  10/20/14 0305 10/21/14 0607 10/22/14 0222 10/22/14 0500  HGB 12.4 12.0 12.2  --   HCT 38.2 37.3 37.9  --   PLT 228 203 228  --   APTT  --   --   --  100*  LABPROT 18.6* 16.5*  --  15.8*  INR 1.54* 1.32  --  1.25  HEPARINUNFRC 0.58 0.61  --  0.48  CREATININE 1.49* 1.61* 1.62*  --     Assessment: 59 yo f on heparin for afib and LV thrombus. Heparin continues at 1350 units/hr.  HL this AM is therapeutic at 0.48 - no changes necessary.  No further issues and CBC stable.  Goal of Therapy:  Heparin level 0.3-0.7 units/ml Monitor platelets by anticoagulation protocol: Yes   Plan:  Continue heparin infusion at 1350 units/hr F/u AM HL Monitor for bleeding  Sheppard Coil PharmD., BCPS Clinical Pharmacist Pager [phone] 10/22/2014 8:17 AM

## 2014-10-22 NOTE — Progress Notes (Signed)
Patient ID: [name] [name], female   DOB: 1955/09/05, 59 y.o.   MRN: [id number]   SUBJECTIVE:  Weight down 2 pounds. Diuresed well yesterday.  Sildenafil started 10/20/14.  Remains on IV amio, in NSR.   Creatinine stable today.   Echo 10/19/14 with mild to moderate AS (mean gradient 18 mmHG)   Scheduled Meds: . B-complex with vitamin C  1 tablet Oral Daily  . cholecalciferol  2,000 Units Oral Daily  . feeding supplement (ENSURE ENLIVE)  237 mL Oral TID BM  . magnesium chloride  1 tablet Oral BID  . metoprolol  25 mg Oral BID  . pantoprazole  40 mg Oral Daily  . pravastatin  40 mg Oral Daily  . sildenafil  40 mg Oral TID  . sodium chloride  3 mL Intravenous Q12H   Continuous Infusions: . amiodarone 30 mg/hr (10/22/14 0445)  . heparin 1,350 Units/hr (10/22/14 0000)   PRN Meds:.sodium chloride, acetaminophen, benzonatate, mometasone-formoterol, morphine, ondansetron (ZOFRAN) IV, sodium chloride    Filed Vitals:   10/21/14 2226 10/21/14 2346 10/22/14 0000 10/22/14 0400  BP: 106/59 98/50 101/56 104/79  Pulse:      Temp:  98.5 F (36.9 C)  98 F (36.7 C)  TempSrc:  Oral  Oral  Resp: Height:      Weight:    226 lb 8 oz (102.74 kg)  SpO2:  95%  91%    Intake/Output Summary (Last 24 hours) at 10/22/14 0745 Last data filed at 10/22/14 0600  Gross per 24 hour  Intake 1187.9 ml  Output   3300 ml  Net -2112.1 ml    LABS: Basic Metabolic Panel:  Recent Labs  13/08/65 0607 10/22/14 0222  NA 135 135  K 3.6 3.8  CL 95* 94*  CO2 32 33*  GLUCOSE 94 84  BUN 18 21*  CREATININE 1.61* 1.62*  CALCIUM 8.9 9.1   Liver Function Tests: No results for input(s): AST, ALT, ALKPHOS, BILITOT, PROT, ALBUMIN in the last 72 hours. No results for input(s): LIPASE, AMYLASE in the last 72 hours. CBC:  Recent Labs  10/21/14 0607 10/22/14 0222  WBC 6.0 6.4  HGB 12.0 12.2  HCT 37.3 37.9  MCV 88.6 88.1  PLT 203 228   Cardiac Enzymes: No results for input(s):  CKTOTAL, CKMB, CKMBINDEX, TROPONINI in the last 72 hours. BNP: Invalid input(s): POCBNP D-Dimer: No results for input(s): DDIMER in the last 72 hours. Hemoglobin A1C: No results for input(s): HGBA1C in the last 72 hours. Fasting Lipid Panel: No results for input(s): CHOL, HDL, LDLCALC, TRIG, CHOLHDL, LDLDIRECT in the last 72 hours. Thyroid Function Tests: No results for input(s): TSH, T4TOTAL, T3FREE, THYROIDAB in the last 72 hours.  Invalid input(s): FREET3 Anemia Panel: No results for input(s): VITAMINB12, FOLATE, FERRITIN, TIBC, IRON, RETICCTPCT in the last 72 hours.  RADIOLOGY: Dg Chest 2 View  10/22/2014   CLINICAL DATA:  History of CHF. History of elevated white blood cell count.  EXAM: CHEST  2 VIEW  COMPARISON:  None.  FINDINGS: Cardiomegaly with mild bilateral from interstitial prominence. Congestive heart failure cannot be excluded. Pneumonitis cannot be excluded. Right base subsegmental atelectasis. No pneumothorax.  IMPRESSION: 1. Cardiomegaly with mild by pulmonary interstitial prominence suggesting mild congestive heart failure. Interstitial pneumonitis cannot be excluded.  2. Right base subsegmental atelectasis.   Electronically Signed   By: Maisie Fus  Register   On: 10/22/2014 07:21    PHYSICAL EXAM General: NAD Neck: JVP 8 cm, no thyromegaly  or thyroid nodule noted Lungs: CTA, Normal effort. CV: Nondisplaced PMI.  Heart regular S1/S2, no S3/S4, 3/6 SEM RUSB, 1/6 diastolic murmur.  No edema  Abdomen: Obese. soft, NT, ND, no HSM Neurologic: Alert and oriented x 3.  Psych: Normal affect. Extremities: No clubbing or cyanosis.   TELEMETRY: NSR  ASSESSMENT AND PLAN: 59 yo with severe rheumatic mitral stenosis, pulmonary hypertension, RV failure, atrial fibrillation, and very large LA thrombus was admitted with atrial fibrillation/RVR and acute/chronic diastolic CHF.   1. Acute on chronic diastolic CHF: LV EF 50% with D-shaped IV septum and moderately dilated RV with  moderate to severe systolic dysfunction. She has NYHA class IIIb-IV symptoms at home.  Patient was severely volume overloaded in the setting of severe mitral stenosis with pulmonary hypertension and RV failure. Weight down considerably with diuresis, creatinine up slightly but stable today. - Can start torsemide 40 mg daily today.  2. Atrial fibrillation:  Large LA thrombus, does not appear to have improved with several weeks of anticoagulation (though she has been subtherapeutic at times). Spontaneously converted to NSR 10/20/14 and was started on amio. Has had brief episodes of AF since but back in NSR today.  - Heparin gtt for now, hold coumadin for surgery.  - On IV amio to try and maintain NSR. - ?Maze + LA clot removal at surgery.   3. Mitral stenosis: Severe by TEE, likely rheumatic.She additionally has mild-moderate aortic stenosis.Discussed with Dr Cornelius Moras => very large LA thrombus appears unlikely to resolve any time soon with anticoagulation. Plan valve surgery Wednesday along with thrombus retrieval and ?Maze.She has had cath recently showing no CAD.  4. Left renal artery infarction: Creatinine up slightly with diuresis, stable today. 5. 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 PAPs ~80. PVR 5 WU with recent RHC. Suspect component of PAH from reactive pulmonary vascular changes in face of long-standing MS. Given degree of PH and RV failure, suspect surgery will be high risk.  - Started sildenafil to help lower PA pressures for surgery.  - Will additionally add low dose of milrinone 0.125 mcg/kg/min today, also to lower PA pressure and improve RV function prior to surgery. Can start today now that she has had additional amiodarone.  6. Aortic stenosis: Mild to moderate on echo 7. Asymmetric left leg swelling: Dopplers negative 10/6  Marca Ancona 10/22/2014 8:04 AM  Advanced Heart Failure Team Pager [phone] (M-F; 7a - 4p)  Please contact  [facility] for night-coverage after hours (4p -7a ) and weekends on [email or web address]

## 2014-10-22 NOTE — Progress Notes (Signed)
BP 78/50 when taken manually this evening, per order, hold milrinone drip if SBP <80, patient asymptomatic with no cardiac complaints, Dr. Zachery Conch notified and aware, drip continued per verbal orders and BP 88/60 when rechecked 30 minutes later, will continue to monitor closely.

## 2014-10-22 NOTE — Progress Notes (Signed)
CARDIAC REHAB PHASE I   PRE:  Rate/Rhythm: 85 SR  BP:  Supine:   Sitting: 111/62  Standing:    SaO2: 99%RA  MODE:  Ambulation: 200 ft   POST:  Rate/Rhythm: 104 ST  BP:  Supine:   Sitting: 132/61  Standing:    SaO2: 97%RA 1050-1114 Pt walked 200 ft on RA with rolling walker with frequent rest stops. Pt c/o left knee and thigh pain during walk like a bad cramp. Not shooting pain. Stopped to rest due to some SOB and the leg discomfort. Very motivated and wanted to go farther. To recliner with legs elevated.   Luetta Nutting, RN BSN  10/22/2014 11:11 AM

## 2014-10-22 NOTE — Progress Notes (Signed)
   10/22/14 3086  Clinical Encounter Type  Visited With Patient;Health care provider  Visit Type Initial  Referral From Patient  Spiritual Encounters  Spiritual Needs Literature   Chaplain responded to a request from the patient to help complete advanced directive. Advance directive form not yet completed, and patient will get to it in a few hours. Chaplain is available to help complete document and support is available as needed.   Alda Ponder, Chaplain 10/22/2014 9:24 AM

## 2014-10-23 ENCOUNTER — Inpatient Hospital Stay (HOSPITAL_COMMUNITY): Payer: Commercial Managed Care - HMO

## 2014-10-23 ENCOUNTER — Inpatient Hospital Stay (HOSPITAL_COMMUNITY): Payer: Commercial Managed Care - HMO | Admitting: Certified Registered"

## 2014-10-23 ENCOUNTER — Encounter (HOSPITAL_COMMUNITY): Payer: Self-pay | Admitting: Radiology

## 2014-10-23 ENCOUNTER — Encounter (HOSPITAL_COMMUNITY): Admission: AD | Disposition: E | Payer: Self-pay | Source: Ambulatory Visit | Attending: Cardiology

## 2014-10-23 DIAGNOSIS — I469 Cardiac arrest, cause unspecified: Secondary | ICD-10-CM

## 2014-10-23 DIAGNOSIS — I213 ST elevation (STEMI) myocardial infarction of unspecified site: Secondary | ICD-10-CM

## 2014-10-23 DIAGNOSIS — R52 Pain, unspecified: Secondary | ICD-10-CM

## 2014-10-23 DIAGNOSIS — I080 Rheumatic disorders of both mitral and aortic valves: Secondary | ICD-10-CM

## 2014-10-23 DIAGNOSIS — R0989 Other specified symptoms and signs involving the circulatory and respiratory systems: Secondary | ICD-10-CM

## 2014-10-23 DIAGNOSIS — J9600 Acute respiratory failure, unspecified whether with hypoxia or hypercapnia: Secondary | ICD-10-CM

## 2014-10-23 DIAGNOSIS — Z789 Other specified health status: Secondary | ICD-10-CM

## 2014-10-23 DIAGNOSIS — Z978 Presence of other specified devices: Secondary | ICD-10-CM | POA: Insufficient documentation

## 2014-10-23 DIAGNOSIS — I743 Embolism and thrombosis of arteries of the lower extremities: Secondary | ICD-10-CM

## 2014-10-23 DIAGNOSIS — J9601 Acute respiratory failure with hypoxia: Secondary | ICD-10-CM

## 2014-10-23 DIAGNOSIS — I998 Other disorder of circulatory system: Secondary | ICD-10-CM

## 2014-10-23 HISTORY — PX: EMBOLECTOMY: SHX44

## 2014-10-23 LAB — TROPONIN I
TROPONIN I: 0.51 ng/mL — AB (ref <0.031)
TROPONIN I: 0.43 ng/mL — AB (ref <0.031)
Troponin I: 0.43 ng/mL — ABNORMAL HIGH (ref <0.031)

## 2014-10-23 LAB — CBC
HEMATOCRIT: 45.3 % (ref 36.0–46.0)
Hemoglobin: 14.9 g/dL (ref 12.0–15.0)
MCH: 29.5 pg (ref 26.0–34.0)
MCHC: 32.9 g/dL (ref 30.0–36.0)
MCV: 89.7 fL (ref 78.0–100.0)
Platelets: 205 10*3/uL (ref 150–400)
RBC: 5.05 MIL/uL (ref 3.87–5.11)
RDW: 17.4 % — AB (ref 11.5–15.5)
WBC: 10.1 10*3/uL (ref 4.0–10.5)

## 2014-10-23 LAB — GLUCOSE, CAPILLARY
GLUCOSE-CAPILLARY: 140 mg/dL — AB (ref 65–99)
GLUCOSE-CAPILLARY: 105 mg/dL — AB (ref 65–99)
Glucose-Capillary: 163 mg/dL — ABNORMAL HIGH (ref 65–99)
Glucose-Capillary: 107 mg/dL — ABNORMAL HIGH (ref 65–99)
Glucose-Capillary: 134 mg/dL — ABNORMAL HIGH (ref 65–99)

## 2014-10-23 LAB — POCT I-STAT 3, ART BLOOD GAS (G3+)
ACID-BASE DEFICIT: 1 mmol/L (ref 0.0–2.0)
ACID-BASE EXCESS: 4 mmol/L — AB (ref 0.0–2.0)
BICARBONATE: 28.3 meq/L — AB (ref 20.0–24.0)
Bicarbonate: 28.5 mEq/L — ABNORMAL HIGH (ref 20.0–24.0)
Bicarbonate: 30.7 mEq/L — ABNORMAL HIGH (ref 20.0–24.0)
O2 SAT: 100 %
O2 Saturation: 88 %
O2 Saturation: 91 %
PCO2 ART: 52.7 mmHg — AB (ref 35.0–45.0)
PH ART: 7.295 — AB (ref 7.350–7.450)
PH ART: 7.372 (ref 7.350–7.450)
PO2 ART: 63 mmHg — AB (ref 80.0–100.0)
PO2 ART: 66 mmHg — AB (ref 80.0–100.0)
PO2 ART: 343 mmHg — AB (ref 80.0–100.0)
Patient temperature: 98
Patient temperature: 97.5
Patient temperature: 97.8
TCO2: 30 mmol/L (ref 0–100)
TCO2: 30 mmol/L (ref 0–100)
TCO2: 32 mmol/L (ref 0–100)
pCO2 arterial: 64.9 mmHg (ref 35.0–45.0)
pCO2 arterial: 57.7 mmHg (ref 35.0–45.0)
pH, Arterial: 7.249 — ABNORMAL LOW (ref 7.350–7.450)

## 2014-10-23 LAB — BASIC METABOLIC PANEL
ANION GAP: 14 (ref 5–15)
BUN: 25 mg/dL — AB (ref 6–20)
CALCIUM: 9.3 mg/dL (ref 8.9–10.3)
CO2: 25 mmol/L (ref 22–32)
Chloride: 96 mmol/L — ABNORMAL LOW (ref 101–111)
Creatinine, Ser: 1.75 mg/dL — ABNORMAL HIGH (ref 0.44–1.00)
GFR calc Af Amer: 36 mL/min — ABNORMAL LOW (ref >60)
GFR calc non Af Amer: 31 mL/min — ABNORMAL LOW (ref >60)
GLUCOSE: 215 mg/dL — AB (ref 65–99)
POTASSIUM: 4.4 mmol/L (ref 3.5–5.1)
Sodium: 135 mmol/L (ref 135–145)

## 2014-10-23 LAB — CARBOXYHEMOGLOBIN
Carboxyhemoglobin: 1.3 % (ref 0.5–1.5)
METHEMOGLOBIN: 0.8 % (ref 0.0–1.5)
O2 Saturation: 68 %
Total hemoglobin: 13.7 g/dL (ref 12.0–16.0)

## 2014-10-23 LAB — URINALYSIS, ROUTINE W REFLEX MICROSCOPIC
BILIRUBIN URINE: NEGATIVE
Glucose, UA: NEGATIVE mg/dL
KETONES UR: NEGATIVE mg/dL
Nitrite: NEGATIVE
PROTEIN: 30 mg/dL — AB
Specific Gravity, Urine: 1.015 (ref 1.005–1.030)
UROBILINOGEN UA: 0.2 mg/dL (ref 0.0–1.0)
pH: 7 (ref 5.0–8.0)

## 2014-10-23 LAB — TYPE AND SCREEN
ABO/RH(D): O POS
ANTIBODY SCREEN: NEGATIVE

## 2014-10-23 LAB — PHOSPHORUS: Phosphorus: 4.8 mg/dL — ABNORMAL HIGH (ref 2.5–4.6)

## 2014-10-23 LAB — LACTIC ACID, PLASMA
LACTIC ACID, VENOUS: 2 mmol/L (ref 0.5–2.0)
Lactic Acid, Venous: 7.9 mmol/L (ref 0.5–2.0)
Lactic Acid, Venous: 1.8 mmol/L (ref 0.5–2.0)

## 2014-10-23 LAB — PROTIME-INR
INR: 1.19 (ref 0.00–1.49)
Prothrombin Time: 15.3 seconds — ABNORMAL HIGH (ref 11.6–15.2)

## 2014-10-23 LAB — PROCALCITONIN: PROCALCITONIN: 7.29 ng/mL

## 2014-10-23 LAB — MAGNESIUM: MAGNESIUM: 1.9 mg/dL (ref 1.7–2.4)

## 2014-10-23 LAB — BASIC METABOLIC PANEL WITH GFR
Anion gap: 10 (ref 5–15)
BUN: 28 mg/dL — ABNORMAL HIGH (ref 6–20)
CO2: 28 mmol/L (ref 22–32)
Calcium: 9.1 mg/dL (ref 8.9–10.3)
Chloride: 94 mmol/L — ABNORMAL LOW (ref 101–111)
Creatinine, Ser: 2.06 mg/dL — ABNORMAL HIGH (ref 0.44–1.00)
GFR calc Af Amer: 29 mL/min — ABNORMAL LOW
GFR calc non Af Amer: 25 mL/min — ABNORMAL LOW
Glucose, Bld: 134 mg/dL — ABNORMAL HIGH (ref 65–99)
Potassium: 4.1 mmol/L (ref 3.5–5.1)
Sodium: 132 mmol/L — ABNORMAL LOW (ref 135–145)

## 2014-10-23 LAB — URINE MICROSCOPIC-ADD ON

## 2014-10-23 LAB — ABO/RH: ABO/RH(D): O POS

## 2014-10-23 LAB — HEPARIN LEVEL (UNFRACTIONATED): HEPARIN UNFRACTIONATED: 0.47 [IU]/mL (ref 0.30–0.70)

## 2014-10-23 SURGERY — EMBOLECTOMY
Anesthesia: General | Site: Leg Upper | Laterality: Right

## 2014-10-23 MED ORDER — MIDAZOLAM HCL 2 MG/2ML IJ SOLN
2.0000 mg | INTRAMUSCULAR | Status: DC | PRN
  Administered 2014-10-23: 2 mg via INTRAVENOUS

## 2014-10-23 MED ORDER — PANTOPRAZOLE SODIUM 40 MG IV SOLR: 40.0000 mg | INTRAVENOUS | Status: DC

## 2014-10-23 MED ORDER — HEPARIN (PORCINE) IN NACL 100-0.45 UNIT/ML-% IJ SOLN
1350.0000 [IU]/h | INTRAMUSCULAR | Status: DC
  Administered 2014-10-23: 1350 [IU]/h via INTRAVENOUS
  Filled 2014-10-23: qty 250

## 2014-10-23 MED ORDER — CHLORHEXIDINE GLUCONATE 0.12% ORAL RINSE (MEDLINE KIT)
15.0000 mL | Freq: Two times a day (BID) | OROMUCOSAL | Status: DC
  Administered 2014-10-23 – 2014-10-24 (×4): 15 mL via OROMUCOSAL

## 2014-10-23 MED ORDER — INSULIN ASPART 100 UNIT/ML ~~LOC~~ SOLN
2.0000 [IU] | SUBCUTANEOUS | Status: DC
  Administered 2014-10-23: 2 [IU] via SUBCUTANEOUS
  Administered 2014-10-23 – 2014-10-24 (×2): 4 [IU] via SUBCUTANEOUS
  Administered 2014-10-24 – 2014-10-25 (×5): 2 [IU] via SUBCUTANEOUS

## 2014-10-23 MED ORDER — FENTANYL CITRATE (PF) 100 MCG/2ML IJ SOLN
100.0000 ug | Freq: Once | INTRAMUSCULAR | Status: AC
  Administered 2014-10-23: 100 ug via INTRAVENOUS

## 2014-10-23 MED ORDER — BIOTENE DRY MOUTH MT LIQD: 15.0000 mL | Freq: Four times a day (QID) | OROMUCOSAL | Status: DC

## 2014-10-23 MED ORDER — CHLORHEXIDINE GLUCONATE 0.12% ORAL RINSE (MEDLINE KIT): 15.0000 mL | Freq: Two times a day (BID) | OROMUCOSAL | Status: DC

## 2014-10-23 MED ORDER — MIDAZOLAM HCL 2 MG/2ML IJ SOLN
INTRAMUSCULAR | Status: AC
  Filled 2014-10-23: qty 2

## 2014-10-23 MED ORDER — FENTANYL CITRATE (PF) 100 MCG/2ML IJ SOLN
INTRAMUSCULAR | Status: AC
  Filled 2014-10-23: qty 2

## 2014-10-23 MED ORDER — ROCURONIUM BROMIDE 50 MG/5ML IV SOLN
INTRAVENOUS | Status: AC
  Administered 2014-10-23 (×2): 50 mg via INTRAVENOUS
  Filled 2014-10-23: qty 2

## 2014-10-23 MED ORDER — HEPARIN (PORCINE) IN NACL 100-0.45 UNIT/ML-% IJ SOLN
1350.0000 [IU]/h | INTRAMUSCULAR | Status: DC
  Administered 2014-10-23: 1350 [IU]/h via INTRAVENOUS

## 2014-10-23 MED ORDER — ANTISEPTIC ORAL RINSE SOLUTION (CORINZ): 7.0000 mL | Freq: Four times a day (QID) | OROMUCOSAL | Status: DC

## 2014-10-23 MED ORDER — MIDAZOLAM HCL 2 MG/2ML IJ SOLN
INTRAMUSCULAR | Status: AC
  Filled 2014-10-23: qty 4

## 2014-10-23 MED ORDER — PANTOPRAZOLE SODIUM 40 MG IV SOLR
40.0000 mg | Freq: Every day | INTRAVENOUS | Status: DC
  Administered 2014-10-23 – 2014-10-24 (×2): 40 mg via INTRAVENOUS
  Filled 2014-10-23 (×2): qty 40

## 2014-10-23 MED ORDER — MAGNESIUM SULFATE 2 GM/50ML IV SOLN
2.0000 g | Freq: Once | INTRAVENOUS | Status: AC
  Administered 2014-10-23: 2 g via INTRAVENOUS
  Filled 2014-10-23: qty 50

## 2014-10-23 MED ORDER — SUCCINYLCHOLINE CHLORIDE 20 MG/ML IJ SOLN
INTRAMUSCULAR | Status: AC
  Filled 2014-10-23: qty 1

## 2014-10-23 MED ORDER — SODIUM CHLORIDE 0.9 % IV SOLN
0.0300 [IU]/min | INTRAVENOUS | Status: DC
  Filled 2014-10-23: qty 2

## 2014-10-23 MED ORDER — NOREPINEPHRINE BITARTRATE 1 MG/ML IV SOLN
0.0000 ug/min | INTRAVENOUS | Status: DC
  Administered 2014-10-23: 33 ug/min via INTRAVENOUS
  Administered 2014-10-23: 10 ug/min via INTRAVENOUS
  Administered 2014-10-24: 18 ug/min via INTRAVENOUS
  Filled 2014-10-23 (×4): qty 16

## 2014-10-23 MED ORDER — VANCOMYCIN HCL 10 G IV SOLR
1250.0000 mg | INTRAVENOUS | Status: DC
  Administered 2014-10-24 – 2014-10-25 (×2): 1250 mg via INTRAVENOUS
  Filled 2014-10-23 (×2): qty 1250

## 2014-10-23 MED ORDER — FENTANYL CITRATE (PF) 100 MCG/2ML IJ SOLN: 50.0000 ug | Freq: Once | INTRAMUSCULAR | Status: DC

## 2014-10-23 MED ORDER — ETOMIDATE 2 MG/ML IV SOLN
INTRAVENOUS | Status: AC
  Filled 2014-10-23: qty 20

## 2014-10-23 MED ORDER — PIPERACILLIN-TAZOBACTAM 3.375 G IVPB
3.3750 g | Freq: Three times a day (TID) | INTRAVENOUS | Status: DC
  Administered 2014-10-23 – 2014-10-25 (×7): 3.375 g via INTRAVENOUS
  Filled 2014-10-23 (×9): qty 50

## 2014-10-23 MED ORDER — LACTATED RINGERS IV SOLN
INTRAVENOUS | Status: DC | PRN
  Administered 2014-10-23: 16:00:00 via INTRAVENOUS

## 2014-10-23 MED ORDER — SODIUM CHLORIDE 0.9 % IV SOLN
25.0000 ug/h | INTRAVENOUS | Status: DC
  Administered 2014-10-23 – 2014-10-25 (×2): 50 ug/h via INTRAVENOUS
  Filled 2014-10-23: qty 50

## 2014-10-23 MED ORDER — FUROSEMIDE 10 MG/ML IJ SOLN
80.0000 mg | Freq: Once | INTRAMUSCULAR | Status: AC
  Administered 2014-10-23: 80 mg via INTRAVENOUS
  Filled 2014-10-23: qty 8

## 2014-10-23 MED ORDER — FUROSEMIDE 10 MG/ML IJ SOLN
80.0000 mg | Freq: Two times a day (BID) | INTRAMUSCULAR | Status: DC
  Administered 2014-10-23: 80 mg via INTRAVENOUS
  Filled 2014-10-23: qty 8

## 2014-10-23 MED ORDER — VANCOMYCIN HCL 10 G IV SOLR
2000.0000 mg | Freq: Once | INTRAVENOUS | Status: AC
  Administered 2014-10-23: 2000 mg via INTRAVENOUS
  Filled 2014-10-23: qty 2000

## 2014-10-23 MED ORDER — HEPARIN SODIUM (PORCINE) 1000 UNIT/ML IJ SOLN
INTRAMUSCULAR | Status: DC | PRN
  Administered 2014-10-23: 7000 [IU] via INTRAVENOUS

## 2014-10-23 MED ORDER — MIDAZOLAM HCL 2 MG/2ML IJ SOLN
2.0000 mg | INTRAMUSCULAR | Status: AC | PRN
  Administered 2014-10-23 (×2): 1 mg via INTRAVENOUS
  Administered 2014-10-25: 2 mg via INTRAVENOUS
  Filled 2014-10-23: qty 2

## 2014-10-23 MED ORDER — LIDOCAINE HCL (CARDIAC) 20 MG/ML IV SOLN
INTRAVENOUS | Status: AC
  Filled 2014-10-23: qty 5

## 2014-10-23 MED ORDER — 0.9 % SODIUM CHLORIDE (POUR BTL) OPTIME
TOPICAL | Status: DC | PRN
  Administered 2014-10-23: 2000 mL

## 2014-10-23 MED ORDER — THROMBIN 20000 UNITS EX SOLR
CUTANEOUS | Status: AC
  Filled 2014-10-23: qty 20000

## 2014-10-23 MED ORDER — ANTISEPTIC ORAL RINSE SOLUTION (CORINZ)
7.0000 mL | OROMUCOSAL | Status: DC
  Administered 2014-10-23 – 2014-10-25 (×16): 7 mL via OROMUCOSAL

## 2014-10-23 MED ORDER — NOREPINEPHRINE BITARTRATE 1 MG/ML IV SOLN
0.0000 ug/min | Freq: Once | INTRAVENOUS | Status: DC
  Filled 2014-10-23: qty 4

## 2014-10-23 MED ORDER — FENTANYL BOLUS VIA INFUSION
50.0000 ug | INTRAVENOUS | Status: DC | PRN
  Filled 2014-10-23: qty 50

## 2014-10-23 MED ORDER — FAMOTIDINE IN NACL 20-0.9 MG/50ML-% IV SOLN
20.0000 mg | INTRAVENOUS | Status: DC
  Administered 2014-10-23: 20 mg via INTRAVENOUS
  Filled 2014-10-23: qty 50

## 2014-10-23 MED ORDER — LORAZEPAM 2 MG/ML IJ SOLN
INTRAMUSCULAR | Status: AC
  Filled 2014-10-23: qty 1

## 2014-10-23 MED ORDER — LORAZEPAM 2 MG/ML IJ SOLN
1.0000 mg | Freq: Once | INTRAMUSCULAR | Status: AC
  Administered 2014-10-23: 1 mg via INTRAVENOUS

## 2014-10-23 MED ORDER — ALBUMIN HUMAN 5 % IV SOLN
INTRAVENOUS | Status: DC | PRN
  Administered 2014-10-23: 17:00:00 via INTRAVENOUS

## 2014-10-23 MED ORDER — PROPOFOL 10 MG/ML IV BOLUS
INTRAVENOUS | Status: DC | PRN
  Administered 2014-10-23: 25 mg via INTRAVENOUS

## 2014-10-23 MED ORDER — SODIUM CHLORIDE 0.9 % IV SOLN
INTRAVENOUS | Status: DC | PRN
  Administered 2014-10-23: 500 mL

## 2014-10-23 MED FILL — Medication: Qty: 1 | Status: AC

## 2014-10-23 SURGICAL SUPPLY — 57 items
BANDAGE ELASTIC 4 VELCRO ST LF (GAUZE/BANDAGES/DRESSINGS) ×2 IMPLANT
BANDAGE ESMARK 6X9 LF (GAUZE/BANDAGES/DRESSINGS) IMPLANT
BNDG CMPR 9X6 STRL LF SNTH (GAUZE/BANDAGES/DRESSINGS)
BNDG ESMARK 6X9 LF (GAUZE/BANDAGES/DRESSINGS)
CANISTER SUCTION 2500CC (MISCELLANEOUS) ×3 IMPLANT
CATH EMB 3FR 80CM (CATHETERS) IMPLANT
CATH EMB 4FR 80CM (CATHETERS) ×2 IMPLANT
CATH EMB 5FR 80CM (CATHETERS) IMPLANT
CLIP TI MEDIUM 24 (CLIP) ×3 IMPLANT
CLIP TI WIDE RED SMALL 24 (CLIP) ×3 IMPLANT
CUFF TOURNIQUET SINGLE 18IN (TOURNIQUET CUFF) IMPLANT
CUFF TOURNIQUET SINGLE 24IN (TOURNIQUET CUFF) IMPLANT
CUFF TOURNIQUET SINGLE 34IN LL (TOURNIQUET CUFF) IMPLANT
CUFF TOURNIQUET SINGLE 44IN (TOURNIQUET CUFF) IMPLANT
DRAIN CHANNEL 15F RND FF W/TCR (WOUND CARE) ×2 IMPLANT
DRAPE X-RAY CASS 24X20 (DRAPES) IMPLANT
ELECT REM PT RETURN 9FT ADLT (ELECTROSURGICAL) ×3
ELECTRODE REM PT RTRN 9FT ADLT (ELECTROSURGICAL) ×1 IMPLANT
EVACUATOR SILICONE 100CC (DRAIN) ×2 IMPLANT
GLOVE BIO SURGEON STRL SZ7.5 (GLOVE) ×3 IMPLANT
GLOVE BIOGEL PI IND STRL 6.5 (GLOVE) IMPLANT
GLOVE BIOGEL PI IND STRL 7.5 (GLOVE) IMPLANT
GLOVE BIOGEL PI IND STRL 8 (GLOVE) ×1 IMPLANT
GLOVE BIOGEL PI INDICATOR 6.5 (GLOVE) ×6
GLOVE BIOGEL PI INDICATOR 7.5 (GLOVE) ×2
GLOVE BIOGEL PI INDICATOR 8 (GLOVE) ×2
GLOVE ECLIPSE 6.5 STRL STRAW (GLOVE) ×4 IMPLANT
GLOVE ECLIPSE 7.0 STRL STRAW (GLOVE) ×2 IMPLANT
GOWN STRL REUS W/ TWL LRG LVL3 (GOWN DISPOSABLE) ×3 IMPLANT
GOWN STRL REUS W/TWL LRG LVL3 (GOWN DISPOSABLE) ×9
KIT BASIN OR (CUSTOM PROCEDURE TRAY) ×3 IMPLANT
KIT ROOM TURNOVER OR (KITS) ×3 IMPLANT
LIQUID BAND (GAUZE/BANDAGES/DRESSINGS) ×3 IMPLANT
MARKER SKIN DUAL TIP RULER LAB (MISCELLANEOUS) ×2 IMPLANT
NS IRRIG 1000ML POUR BTL (IV SOLUTION) ×6 IMPLANT
PACK PERIPHERAL VASCULAR (CUSTOM PROCEDURE TRAY) ×3 IMPLANT
PAD ARMBOARD 7.5X6 YLW CONV (MISCELLANEOUS) ×6 IMPLANT
PAD DEFIB R2 (MISCELLANEOUS) ×2 IMPLANT
SET COLLECT BLD 21X3/4 12 (NEEDLE) IMPLANT
SPONGE GAUZE 4X4 12PLY STER LF (GAUZE/BANDAGES/DRESSINGS) ×2 IMPLANT
SPONGE SURGIFOAM ABS GEL 100 (HEMOSTASIS) IMPLANT
STAPLER VISISTAT (STAPLE) IMPLANT
STOPCOCK 4 WAY LG BORE MALE ST (IV SETS) IMPLANT
SUT ETHILON 3 0 PS 1 (SUTURE) ×2 IMPLANT
SUT PROLENE 5 0 C 1 24 (SUTURE) ×1 IMPLANT
SUT PROLENE 6 0 BV (SUTURE) ×8 IMPLANT
SUT VIC AB 2-0 CT1 27 (SUTURE) ×3
SUT VIC AB 2-0 CT1 TAPERPNT 27 (SUTURE) IMPLANT
SUT VIC AB 2-0 CTB1 (SUTURE) ×1 IMPLANT
SUT VIC AB 3-0 SH 27 (SUTURE) ×3
SUT VIC AB 3-0 SH 27X BRD (SUTURE) ×1 IMPLANT
SUT VICRYL 4-0 PS2 18IN ABS (SUTURE) ×3 IMPLANT
SYR 3ML LL SCALE MARK (SYRINGE) ×3 IMPLANT
TRAY FOLEY W/METER SILVER 16FR (SET/KITS/TRAYS/PACK) ×3 IMPLANT
TUBING EXTENTION W/L.L. (IV SETS) IMPLANT
UNDERPAD 30X30 INCONTINENT (UNDERPADS AND DIAPERS) ×3 IMPLANT
WATER STERILE IRR 1000ML POUR (IV SOLUTION) ×3 IMPLANT

## 2014-10-23 NOTE — Procedures (Signed)
Central Venous Catheter Insertion Procedure Note [name] [name] [id number] [birth year]  Procedure: Insertion of Central Venous Catheter Indications: Assessment of intravascular volume, Drug and/or fluid administration and Frequent blood sampling  Procedure Details Consent: Risks of procedure as well as the alternatives and risks of each were explained to the (patient/caregiver).  Consent for procedure obtained. Time Out: Verified patient identification, verified procedure, site/side was marked, verified correct patient position, special equipment/implants available, medications/allergies/relevent history reviewed, required imaging and test results available.  Performed  Maximum sterile technique was used including antiseptics, cap, gloves, gown, hand hygiene, mask and sheet. Skin prep: Chlorhexidine; local anesthetic administered A antimicrobial bonded/coated triple lumen catheter was placed in the left internal jugular vein using the Seldinger technique. Ultrasound guidance used.Yes.   Catheter placed to 20 cm. Blood aspirated via all 3 ports and then flushed x 3. Line sutured x 2 and dressing applied.  Evaluation Blood flow good Complications: No apparent complications Patient did tolerate procedure well. Chest X-ray ordered to verify placement.  CXR: pending.  Joneen Roach, AGACNP-BC [facility] Pager [phone] or [phone]  11/11/2014 7:26 AM   Levy Pupa, MD, PhD 11/08/2014, 7:52 AM [facility] [phone] or if no answer [phone]

## 2014-10-23 NOTE — Progress Notes (Addendum)
ANTIBIOTIC CONSULT NOTE - INITIAL  Pharmacy Consult for vancomycin + zosyn Indication: rule out sepsis, HCAP  Allergies  Allergen Reactions  . Codeine Itching    Patient Measurements: Height: 5' (152.4 cm) Weight: 226 lb 8 oz (102.74 kg) IBW/kg (Calculated) : 45.5 Adjusted Body Weight:   Vital Signs: Temp: 98 F (36.7 C) (10/11 0155) Temp Source: Oral (10/10 2337) BP: 114/58 mmHg (10/11 0155) Pulse Rate: 108 (10/11 0155) Intake/Output from previous day: 10/10 0701 - 10/11 0700 In: 1321.6 [P.O.:960; I.V.:361.6] Out: 1200 [Urine:1200] Intake/Output from this shift: Total I/O In: -  Out: 900 [Urine:900]  Labs:  Recent Labs  10/21/14 0607 10/22/14 0222 Oct 26, 2014 0247  WBC 6.0 6.4 10.1  HGB 12.0 12.2 14.9  PLT 203 228 205  CREATININE 1.61* 1.62* 1.75*   Estimated Creatinine Clearance: 37.4 mL/min (by C-G formula based on Cr of 1.75). No results for input(s): VANCOTROUGH, VANCOPEAK, VANCORANDOM, GENTTROUGH, GENTPEAK, GENTRANDOM, TOBRATROUGH, TOBRAPEAK, TOBRARND, AMIKACINPEAK, AMIKACINTROU, AMIKACIN in the last 72 hours.   Microbiology: Recent Results (from the past 720 hour(s))  MRSA PCR Screening     Status: None   Collection Time: 10/19/2014  2:00 PM  Result Value Ref Range Status   MRSA by PCR NEGATIVE NEGATIVE Final    Comment:        The GeneXpert MRSA Assay (FDA approved for NASAL specimens only), is one component of a comprehensive MRSA colonization surveillance program. It is not intended to diagnose MRSA infection nor to guide or monitor treatment for MRSA infections.     Medical History: Past Medical History  Diagnosis Date  . Asthma   . Obesity   . Hypercholesteremia   . Heart murmur   . Atrial fibrillation with rapid ventricular response (HCC) 09/03/2014  . Bifascicular block 09/12/2013  . 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 Pure hypercholesterolemia 09/12/2013  . Severe obesity (BMI >= 40) (HCC) 09/12/2013  . Rheumatic mitral stenosis  09/28/2013  . 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 Chronic diastolic heart failure (HCC)   . Acute renal artery occlusion (HCC) 09/03/2014  . Renal infarction (HCC) 09/03/2014    Medications:  Anti-infectives    Start     Dose/Rate Route Frequency Ordered Stop   11/08/2014 0600  vancomycin (VANCOCIN) 1,250 mg in sodium chloride 0.9 % 250 mL IVPB     1,250 mg 166.7 mL/hr over 90 Minutes Intravenous Every 24 hours 10-26-14 0454     Oct 26, 2014 0600  piperacillin-tazobactam (ZOSYN) IVPB 3.375 g     3.375 g 12.5 mL/hr over 240 Minutes Intravenous Every 8 hours 2014-10-26 0454     October 26, 2014 0600  vancomycin (VANCOCIN) 2,000 mg in sodium chloride 0.9 % 500 mL IVPB     2,000 mg 250 mL/hr over 120 Minutes Intravenous  Once 2014/10/26 0454       Assessment: 59 yof presented to the hospital with cardiac embolus. S/p cardiac arrest tonight and now adding broad-spectrum antibiotics for possible sepsis vs HCAP. Pt is afebrile and WBC is WNL. Scr is elevated at 1.75.  Vanc 10/11>> Zosyn 10/11>>  Goal of Therapy:  Vancomycin trough level 15-20 mcg/ml  Plan:  - Vancomycin 2gm IV x 1 then  IV Q24H - Zosyn 3.375gm IV Q8H (4 hr inf) - F/u renal fxn, C&S, clinical status and trough at [facility]  Emmanual Gauthreaux, Drake Leach 2014/10/26,4:55 AM  Addendum: Heparin to be restarted. It was discontinued during the code and now to resume. She had previously been therapeutic on 1350 units/hr so will resume  at that rate and check an 8 hour heparin level.  Lysle Pearl, PharmD, BCPS Pager # [phone] 10/29/2014 5:57 AM

## 2014-10-23 NOTE — Progress Notes (Signed)
ANTICOAGULATION CONSULT NOTE - Follow Up Consult  Pharmacy Consult for heparin Indication: atrial fibrillation and LV thrombus, new RUE thrombus   Patient Measurements: Height: 5' (152.4 cm) Weight: 230 lb 2.6 oz (104.4 kg) IBW/kg (Calculated) : 45.5 Heparin Dosing Weight: 74 kg  Vital Signs: Temp: 97.3 F (36.3 C) (10/11 1200) Temp Source: Axillary (10/11 1200) BP: 106/66 mmHg (10/11 1100) Pulse Rate: 80 (10/11 1300)  Labs:  Recent Labs  10/21/14 0607 10/22/14 0222 10/22/14 0500 11/12/2014 0247 11/06/2014 0530 10/20/2014 0531 11/12/2014 1205  HGB 12.0 12.2  --  14.9  --   --   --   HCT 37.3 37.9  --  45.3  --   --   --   PLT 203 228  --  205  --   --   --   APTT  --   --  100*  --   --   --   --   LABPROT 16.5*  --  15.8*  --  15.3*  --   --   INR 1.32  --  1.25  --  1.19  --   --   HEPARINUNFRC 0.61  --  0.48  --   --   --  0.47  CREATININE 1.61* 1.62*  --  1.75*  --   --   --   TROPONINI  --   --   --   --   --  0.43*  --     Assessment: 59 yo f on heparin for afib and LV thrombus.   PEA arrest overnight, heparin briefly off. Restarted at previous rate and remains at goal (0.47) this morning. No bleeding issues noted.  New dopplers show RUE thrombus, heparin has been within therapeutic range for the past week.  Goal of Therapy:  Heparin level 0.3-0.7 units/ml Monitor platelets by anticoagulation protocol: Yes   Plan:  Continue heparin infusion at 1350 units/hr F/u daily HL Monitor for bleeding  Sheppard Coil PharmD., BCPS Clinical Pharmacist Pager [phone] 10/22/2014 1:13 PM

## 2014-10-23 NOTE — Procedures (Signed)
Intubation Procedure Note RENEE ERB [id number] 10-May-1955  Procedure: Intubation Indications: Airway protection and maintenance  Procedure Details Consent: Unable to obtain consent because of emergent medical necessity. Time Out: Verified patient identification, verified procedure, site/side was marked, verified correct patient position, special equipment/implants available, medications/allergies/relevent history reviewed, required imaging and test results available.  Performed  Maximum sterile technique was used including hand hygiene.  MAC and 3    Evaluation Hemodynamic Status: Transient hypotension 115/66; O2 sats: currently acceptable Patient's Current Condition: unstable Complications: No apparent complications Patient did tolerate procedure well. Chest X-ray ordered to verify placement.  CXR: pending.   Rushie Chestnut [facility] 11/08/2014

## 2014-10-23 NOTE — Anesthesia Procedure Notes (Signed)
Date/Time: 11/06/2014 4:05 PM Performed by: Rise Patience T Pre-anesthesia Checklist: Patient identified, Emergency Drugs available, Suction available and Patient being monitored Patient Re-evaluated:Patient Re-evaluated prior to inductionOxygen Delivery Method: Circle system utilized Preoxygenation: Pre-oxygenation with 100% oxygen Intubation Type: Inhalational induction Placement Confirmation: positive ETCO2 and breath sounds checked- equal and bilateral Secured at: 23 cm Tube secured with: Tape Comments: Patient intubated upon arrival to OR.

## 2014-10-23 NOTE — Progress Notes (Addendum)
Patient extremely anxious this morning asking to get out of bed with complaints of being hot, cold compresses placed on patient and verbal reassurance given with no effect, patient placed in wheelchair and wheeled around unit per request, patient requesting to stay in hallway until she felt tired enough to finally sleep, patient allowed to stay in doorway of room in wheelchair, however, continued with complaints of anxiety stating she felt she was having a panic attack, BP 114/58, RR 24, HR 100's, patient 96% on RA, temp 98.0, Dr. Friedman notified andZachery Conche, 1 mg IV ativan ordered and administered, patient told she must go back to bed after IV ativan administered, patient still anxious and refusing to get back to bed, patient placed back into bed and once in bed was noted to become even more anxious and agitated, patient yelling to get back out of bed attempting to get out on own, shortly after episode patient became non-verbal and shortly after non-responsive, HR quickly dropped to 40's, patient with blank gaze and some myoclonic activity noted with PEA shortly after, code called, staff in to room to assist, CPR initiated, Dr. Hosie Poisson notified and aware with instructions to intubate, 2 doses of IV Epinephrine administered while waiting for code team to arrive, respiratory to floor, Dr. Zachery Conch called and to floor as well, CPR continued, normal saline bolus administered, EKG and ABG performed, patient intubated and sent to ICU bed 10, Family notified by Dr. Zachery Conch, report given to [place] RN

## 2014-10-23 NOTE — Procedures (Signed)
Arterial Catheter Insertion Procedure Note CARELY NAPPIER [id number] Nov 14, 1955  Procedure: Insertion of Arterial Catheter  Indications: Blood pressure monitoring  Procedure Details Consent: Unable to obtain consent because of emergent medical necessity. Time Out: Verified patient identification, verified procedure, site/side was marked, verified correct patient position, special equipment/implants available, medications/allergies/relevent history reviewed, required imaging and test results available.  Performed  Maximum sterile technique was used including antiseptics, cap, gloves, gown, hand hygiene, mask and sheet. Skin prep: Chlorhexidine; local anesthetic administered 20 gauge catheter was inserted into left femoral artery using the Seldinger technique.  Evaluation Blood flow good; BP tracing good. Complications: No apparent complications.   Joneen Roach, AGACNP-BC [place] [facility] Pager [phone] or [phone]  10/19/2014 7:27 AM   Levy Pupa, MD, PhD 11/03/2014, 7:52 AM  Pulmonary and Critical Care [phone] or if no answer [phone]

## 2014-10-23 NOTE — Progress Notes (Signed)
Dr Dellie Catholic would like CVC pulled back, however, okay with running heparin through line until a second opinion can be obtained d/t lack of access.

## 2014-10-23 NOTE — Progress Notes (Signed)
EKG CRITICAL VALUE     12 lead EKG performed.  Critical value noted. Tonye Becket, PA notified.   Oda Cogan, CCT 11/04/14 7:25 AM

## 2014-10-23 NOTE — Progress Notes (Signed)
*  PRELIMINARY RESULTS* Vascular Ultrasound Lower Extremity Arterial Duplex Right has been completed.  Preliminary findings: Appears to be right popliteal artery thrombus that is occlusive. Severely dampened flow to pedal arteries.   ABI completed: Could not obtain index due to absent brachial, radial, and ulnar artery flow in bilateral upper extremities. Per Dr. Tyson Alias, Upper extremity arterial study is not needed.          ABI   RIGHT    LEFT    PRESSURE WAVEFORM  PRESSURE WAVEFORM  BRACHIAL Absent  BRACHIAL Absent   DP   DP    AT Absent  AT 109 Tri  PT 62 Damp mono PT 101 Tri  PER   PER    GREAT TOE  NA GREAT TOE  NA    RIGHT LEFT   Severely dampened flow Normal flow    Right Lower extremity venous duplex = Negative for DVT.    Farrel Demark, RDMS, RVT  10/28/2014, 9:36 AM

## 2014-10-23 NOTE — Progress Notes (Signed)
CRITICAL VALUE ALERT  Critical value received:  Lactic 7.9  Date of notification:  11/08/2014  Time of notification:  0335  Critical value read back:Yes.    Nurse who received alert:  [place] Perkins  MD notified (1st page):  Zachery Conch  Time of first page:  0335  MD notified (2nd page):  Time of second page:  Responding MD:  Zachery Conch  Time MD responded:  [phone]

## 2014-10-23 NOTE — Op Note (Signed)
    NAME: [name] [name]   MRN: [id number] DOB: 03-04-1955    DATE OF OPERATION: November 19, 2014  PREOP DIAGNOSIS: ischemic right lower extremity  POSTOP DIAGNOSIS: same  PROCEDURE: Right popliteal and tibial embolectomy  SURGEON: Di Kindle. Edilia Bo, MD, FACS  ASSIST: Lianne Cure PA  ANESTHESIA: Gen.   EBL: minimal  INDICATIONS: [name] [name] is a 59 y.o. female who has a left atrial thrombus in acute heart failure. She had a cardiac arrest last night and then was noted to have an ischemic right lower extremity. Duplex today showed clot in the popliteal artery. There was no Doppler flow in the right foot and it was felt that she was at significant risk for limb loss. After discussion with the family we elected to proceed with simple popliteal thrombectomy.  FINDINGS: there was fresh clot and also some older organized clot in the popliteal and tibial vessels.  TECHNIQUE: The patient was taken to the operating room and received a general anesthetic. The right lower extremity was prepped and draped in usual sterile fashion. A longitudinal incision was made along the medial aspect of the right leg and dissection carried down to the popliteal artery which was controlled proximally and distally with a vessel loop. The patient was heparinized. A transverse arteriotomy was made in the below-knee popliteal artery. Using a #3 and #4 Fogarty catheter retrieved a moderate amount of clot from the popliteal artery and tibial vessels. Some of this looked acute but there was also some older organized clot. Also in pulling the Fogarty catheter through the tibial vessels there was irregularity suggesting some chronic disease. Once no further clot was retrieved the artery was flushed with heparinized saline proximally and distally. The arteriotomy was closed with interrupted 6-0 Prolene sutures. At the completion and was good popliteal signal with the Doppler with good diastolic flow. There was an  anterior tibial signal with the Doppler. I did not obtain an arteriogram given her acute renal failure. Hemostasis was obtained in the wound. A 15 Blake drain was placed. The wound was closed with a deep layer of 2-0 Vicryl. The subcutaneous layer was closed with 3-0 Vicryl. The skin was closed with a 4-0 Vicryl. A sterile dressing was applied. The patient tolerated she did well and was transferred to the recovery in stable condition. All needle and sponge counts were correct.  Waverly Ferrari, MD, FACS [facility]  DATE OF DICTATION:   11-19-2014

## 2014-10-23 NOTE — Anesthesia Preprocedure Evaluation (Signed)
Anesthesia Evaluation  Patient identified by MRN, date of birth, ID band Patient unresponsive    Reviewed: Allergy & Precautions, NPO status , Patient's Chart, lab work & pertinent test results  Airway Mallampati: Intubated       Dental   Pulmonary    breath sounds clear to auscultation       Cardiovascular  Rhythm:Irregular Rate:Tachycardia     Neuro/Psych    GI/Hepatic   Endo/Other    Renal/GU      Musculoskeletal   Abdominal   Peds  Hematology   Anesthesia Other Findings   Reproductive/Obstetrics                             Anesthesia Physical Anesthesia Plan  ASA: IV and emergent  Anesthesia Plan: General   Post-op Pain Management:    Induction: Intravenous  Airway Management Planned: Oral ETT  Additional Equipment: Arterial line and CVP  Intra-op Plan:   Post-operative Plan: Post-operative intubation/ventilation  Informed Consent: I have reviewed the patients History and Physical, chart, labs and discussed the procedure including the risks, benefits and alternatives for the proposed anesthesia with the patient or authorized representative who has indicated his/her understanding and acceptance.     Plan Discussed with: CRNA and Anesthesiologist  Anesthesia Plan Comments:         Anesthesia Quick Evaluation

## 2014-10-23 NOTE — Transfer of Care (Signed)
OOD Intubation 

## 2014-10-23 NOTE — Transfer of Care (Signed)
Immediate Anesthesia Transfer of Care Note  Patient: [name] [name]  Procedure(s) Performed: Procedure(s): Right Popliteal and tibial artery Embolectomy (Right)  Patient Location: ICU  Anesthesia Type:General  Level of Consciousness: sedated, unresponsive and Patient remains intubated per anesthesia plan  Airway & Oxygen Therapy: Patient remains intubated per anesthesia plan and Patient placed on Ventilator (see vital sign flow sheet for setting)  Post-op Assessment: Report given to RN and Post -op Vital signs reviewed and stable  Post vital signs: Reviewed and stable  Last Vitals:  Filed Vitals:   2014-11-02 1300  BP:   Pulse: 80  Temp:   Resp: 20    Complications: No apparent anesthesia complications

## 2014-10-23 NOTE — Code Documentation (Signed)
CODE BLUE NOTE  Patient Name: [name] [name]   MRN: [id number]   Date of Birth/ Sex: [birth year] , female      Admission Date: 11/08/2014  Attending Provider: Laurey Morale, MD  Primary Diagnosis: <principal problem not specified>   Indication: Pt was in her usual state of health until this AM, when she was noted to be PEA. Code blue was subsequently called. At the time of arrival on scene, ACLS protocol was underway.    Technical Description:  - CPR performance duration:  10  minute  - Was defibrillation or cardioversion used? No   - Was external pacer placed? No  - Was patient intubated pre/post CPR? Yes    Medications Administered: Y = Yes; Blank = No Amiodarone    Atropine    Calcium    Epinephrine  Y times 2  Lidocaine    Magnesium    Norepinephrine    Phenylephrine    Sodium bicarbonate    Vasopressin      Post CPR evaluation:  - Final Status - Was patient successfully resuscitated ? Yes - What is current rhythm? Sinus rhythm with RBB and questionable inferior infarct - What is current hemodynamic status? stable   Miscellaneous Information:  - Labs sent, including: ABG to be sent.  - Primary team notified?  Yes  - Family Notified? No  - Additional notes/ transfer status: Transferred to ICU. Art line to be placed in ICU for good BP control.   Almon Hercules, MD  2014-11-21, 2:37 AM

## 2014-10-23 NOTE — Procedures (Signed)
History: 59 yo F with altered mental status recent cardiac arrest  Sedation: Fentanyl drip,midazolam  and lorazepam  about 8 hours prior to recording   Technique: This is a 19 channel routine scalp EEG performed at the bedside with bipolar and monopolar montages arranged in accordance to the international 10/20 system of electrode placement. One channel was dedicated to EKG recording.    Background: The majority of this study is recorded during drowsiness or sleep. There is clear asymmetry to faster frequencies with attenuation on the left of both the PDR and sleep structures.  With arousal, there does appear a posterior rhythm on the right with a frequency of 9 Hz. There is generalized irregular slow activity even during maximal wakefulness.   Photic stimulation: Physiologic driving is not performed  EEG Abnormalities: 1) Attenuation of PDR and sleep spindles on the left.  2) generalized slow activity  Clinical Interpretation: This EEG is consistent with a focal left hemispheric cerebral dysfunction. Though non-specific, this pattern of attenuation of faster frequencies can be seen in cortical injury such as stroke, among other causes. There was no seizure or seizure predisposition recorded on this study.   Ritta Slot, MD [facility] [phone]  If 7pm- 7am, please page neurology on call as listed in AMION.

## 2014-10-23 NOTE — Progress Notes (Signed)
Dr. Zachery Conch paged reguarding frequent PACs. Magnesium ordered. Ordered to restart amio at /hr. Also ordered to restart heparin.

## 2014-10-23 NOTE — Consult Note (Signed)
[facility] [place]  Patient name: [name] [name] MRN: [id number] DOB: 12-07-1955 Sex: female  REASON FOR CONSULT: Ischemic right leg. Consult is from Cardiology  HPI: [name] [name] is a 59 y.o. female who was admitted on November 10, 2014 with atrial fibrillation with a rapid ventricular response, a subtherapeutic INR, and persistent left atrial thrombus with acute heart failure. She sustained a cardiac arrest last night requiring CPR. After this event which occurred at approximately 3 AM. BRS lasted for proximally 4 minutes. Reportedly, after the cardiac arrest she was noted to have a cool right foot. No Doppler flow could be obtained. A duplex scan and Doppler studies were ordered today which showed a popliteal artery occlusion and ischemic right lower extremity. Vascular surgery was consult. No history can be obtained from the patient.  Of note, she had been scheduled for a mitral and aortic valve replacement tomorrow however given the events last night this has been canceled.  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 However this was removed CT scan on 09/03/2014.   Past Medical History  Diagnosis Date  . Asthma   . Obesity   . Hypercholesteremia   . Heart murmur   . Atrial fibrillation with rapid ventricular response (HCC) 09/03/2014  . Bifascicular block 09/12/2013  . 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 Pure hypercholesterolemia 09/12/2013  . Severe obesity (BMI >= 40) (HCC) 09/12/2013  . 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 Chronic diastolic heart failure (HCC)   . Acute renal artery occlusion (HCC) 09/03/2014  . 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 Stroke Mother   . Prostate cancer Father     SOCIAL HISTORY: Social History  Substance Use Topics  . Smoking status: Never Smoker   .  Smokeless tobacco: Never Used  . Alcohol Use: 0.0 oz/week    0 Standard drinks or equivalent per week     Comment: Rare    Allergies  Allergen Reactions  . Codeine Itching    Current Facility-Administered Medications  Medication Dose Route Frequency Provider Last Rate Last Dose  . 0.9 %  sodium chloride infusion  250 mL Intravenous PRN Azalee Course, PA 10 mL/hr at 11/04/2014 1300 250 mL at 10/20/2014 1300  . acetaminophen (TYLENOL) tablet 650 mg  650 mg Oral Q4H PRN Azalee Course, PA   650 mg at 10/22/14 1805  . amiodarone (NEXTERONE PREMIX) 360 MG/200ML (1.8 mg/mL) IV infusion  30 mg/hr Intravenous Continuous Dolores Patty, MD 16.7 mL/hr at 10/22/14 0445 30 mg/hr at 10/22/14 0445  . amiodarone (NEXTERONE PREMIX) 360 MG/200ML (1.8 mg/mL) IV infusion  30 mg/hr Intravenous Continuous Laurey Morale, MD 16.7 mL/hr at 10/18/2014 1100 30 mg/hr at 11/08/2014 1100  . antiseptic oral rinse (BIOTENE) solution 15 mL  15 mL Mouth Rinse QID Laurey Morale, MD   15 mL at 10/22/2014 1200  . antiseptic oral rinse solution (CORINZ)  7 mL Mouth Rinse Q2H while awake Nelda Bucks, MD   7 mL at 11/10/2014 1321  . B-complex with vitamin C tablet 1 tablet  1 tablet Oral Daily Laurey Morale, MD   1 tablet at 10/26/2014 1050  . benzonatate (TESSALON) capsule 200 mg  200 mg Oral TID PRN Laurey Morale, MD   200 mg at 11/03/2014 0023  . chlorhexidine  gluconate (PERIDEX) 0.12 % solution 15 mL  15 mL Mouth/Throat BID Laurey Morale, MD   15 mL at 10/14/2014 [phone]  . cholecalciferol (VITAMIN D) tablet 2,000 Units  2,000 Units Oral Daily Azalee Course, PA   2,000 Units at 10/22/2014 1050  . feeding supplement (ENSURE ENLIVE) (ENSURE ENLIVE) liquid 237 mL  237 mL Oral TID BM Azalee Course, PA   237 mL at 10/22/14 2000  . fentaNYL (SUBLIMAZE) 2,500 mcg in sodium chloride 0.9 % 250 mL (10 mcg/mL) infusion  25-400 mcg/hr Intravenous Continuous Leslye Peer, MD 5 mL/hr at 11/11/2014 1100 50 mcg/hr at 11/09/2014 1100  . fentaNYL (SUBLIMAZE) bolus via  infusion 50 mcg  50 mcg Intravenous Q1H PRN Leslye Peer, MD      . fentaNYL (SUBLIMAZE) injection 50 mcg  50 mcg Intravenous Once Leslye Peer, MD      . heparin ADULT infusion 100 units/mL (25000 units/250 mL)  1,350 Units/hr Intravenous Continuous Purcell Nails, MD 13.5 mL/hr at 10/13/2014 1100 1,350 Units/hr at 11/02/2014 1100  . insulin aspart (novoLOG) injection 2-6 Units  2-6 Units Subcutaneous 6 times per day Duayne Cal, NP   4 Units at 10/16/2014 0803  . midazolam (VERSED) injection 2 mg  2 mg Intravenous Q15 min PRN Leslye Peer, MD      . midazolam (VERSED) injection 2 mg  2 mg Intravenous Q2H PRN Leslye Peer, MD   2 mg at 10/16/2014 0330  . mometasone-formoterol (DULERA) 100-5 MCG/ACT inhaler 2 puff  2 puff Inhalation BID PRN Azalee Course, PA   2 puff at 10/21/14 1952  . norepinephrine (LEVOPHED) 16 mg in dextrose 5 % 250 mL (0.064 mg/mL) infusion  0-40 mcg/min Intravenous Titrated Duayne Cal, NP 15 mL/hr at 10/27/2014 1342 16 mcg/min at 11/02/2014 1342  . pantoprazole (PROTONIX) injection 40 mg  40 mg Intravenous QHS Laurey Morale, MD      . piperacillin-tazobactam (ZOSYN) IVPB 3.375 g  3.375 g Intravenous Q8H Rachel L Rumbarger, RPH   3.375 g at 10/31/2014 0803  . pravastatin (PRAVACHOL) tablet 40 mg  40 mg Oral Daily Azalee Course, [place]   40 mg at 10/19/2014 1050  . sodium chloride 0.9 % injection 3 mL  3 mL Intravenous Q12H Azalee Course, PA   3 mL at 11/01/2014 1050  . sodium chloride 0.9 % injection 3 mL  3 mL Intravenous PRN Azalee Course, PA      . Melene Muller ON 10/31/2014] vancomycin (VANCOCIN) 1,250 mg in sodium chloride 0.9 % 250 mL IVPB  1,250 mg Intravenous Q24H Rachel L Rumbarger, RPH        REVIEW OF SYSTEMS: Unable to assess.  PHYSICAL EXAM: Filed Vitals:   10/15/2014 1000 11/03/2014 1100 10/29/2014 1200 10/31/2014 1300  BP:  106/66    Pulse: 87 86 80 80  Temp:   97.3 F (36.3 C)   TempSrc:   Axillary   Resp: Height:      Weight:      SpO2: 100% 100% 100% 100%   Body mass  index is 44.95 kg/(m^2). GENERAL: The patient is an obese female, sedated on the ventilator. The vital signs are documented above. CARDIAC: There is an irregular rhythm VASCULAR: There is no right carotid bruit. There is a catheter in the left neck. She has palpable femoral pulses. I cannot palpate popliteal or pedal pulses. On the right side, which is the symptomatic side, there is no Doppler  flow in the anterior tibial, dorsalis pedis, posterior tibial, or peroneal arteries. On the left side, there is a monophasic posterior tibial signal and a monophasic dorsalis pedis signal. The right foot is cool. PULMONARY: There is good air exchange bilaterally without wheezing or rales. ABDOMEN: Obese and difficult to assess. MUSCULOSKELETAL: There are no major deformities. NEUROLOGIC: The patient does not follow commands. Unable to assess. SKIN: There are no ulcers or rashes noted. PSYCHIATRIC: unable to assess. DATA:  Lab Results  Component Value Date   WBC 10.1 10/20/2014   HGB 14.9 11/06/2014   HCT 45.3 10/22/2014   MCV 89.7 10/18/2014   PLT 205 10/18/2014   Lab Results  Component Value Date   NA 135 10/31/2014   K 4.4 10/22/2014   CL 96* 11/01/2014   CO2 25 10/15/2014   Lab Results  Component Value Date   CREATININE 1.75* 10/26/2014   Lab Results  Component Value Date   INR 1.19 10/22/2014   INR 1.25 10/22/2014   INR 1.32 10/21/2014   No results found for: HGBA1C CBG (last 3)   Recent Labs  10/24/2014 0213 11/06/2014 0728 11/12/2014 1209  GLUCAP 140* 163* 105*   DUPLEX RIGHT LE: Thrombus in right popliteal artery  ARTERIAL DOPPLER STUDY:  On the right side, which is the symptom that excised, there is no flow in the anterior tibial or dorsalis pedis artery. There is a markedly dampened monophasic posterior tibial signal at 9:30 this morning. I am no longer able to obtain that signal.  INR today is 1.19. GFR is 36., Creatinine is 1.75. Platelet count 205,000. She had a  lactic acid of 7.9 last night.  MEDICAL ISSUES:  ISCHEMIC RIGHT LOWER EXTREMITY SECONDARY TO POPLITEAL EMBOLUS: This patient has an ischemic right lower extremity with an embolic event to the right popliteal artery related to her large left atrial thrombus. She is on the ventilator and also on significant levophed. I think without attempted embolectomy, the right leg will continue to worsen and could further complicate her care. Therefore, unless the family wished to consider palliative care, I would recommend attempted right popliteal embolectomy. I would keep this as simple as possible given her overall condition. Cardiology is agreeable with this plan and I have also spoken with the husband and the family. We have discussed that she is critically ill with a high mortality. We have discussed the potential complications of surgery, including but not limited to, bleeding, wound healing problems, and limb loss. We will proceed urgently. Given her renal failure I will not do an intraoperative arteriogram.  Waverly Ferrari [facility] [place]: [phone]

## 2014-10-23 NOTE — Anesthesia Postprocedure Evaluation (Signed)
OOD Intubation 

## 2014-10-23 NOTE — Anesthesia Postprocedure Evaluation (Signed)
  Anesthesia Post-op Note  Patient: [name] [name]  Procedure(s) Performed: Procedure(s): Right Popliteal and tibial artery Embolectomy (Right)  Patient Location: ICU  Anesthesia Type:General  Level of Consciousness: sedated and Patient remains intubated per anesthesia plan  Airway and Oxygen Therapy: Patient remains intubated per anesthesia plan and Patient placed on Ventilator (see vital sign flow sheet for setting)  Post-op Pain: none  Post-op Assessment: Post-op Vital signs reviewed, Patient's Cardiovascular Status Stable, Respiratory Function Stable, Patent Airway and Pain level controlled              Post-op Vital Signs: stable  Last Vitals:  Filed Vitals:   10/16/2014 1900  BP:   Pulse: 81  Temp:   Resp: 20    Complications: No apparent anesthesia complications

## 2014-10-23 NOTE — Progress Notes (Signed)
Pt being transported to OR. Rt unable to complete 1600 vent check.

## 2014-10-23 NOTE — Progress Notes (Signed)
TCTS BRIEF PROGRESS NOTE   Events of last night noted.  Surgery postponed indefinitely.  At some point will need to r/o possible embolic event(s).  Will follow closely.  Purcell Nails, MD 11/08/2014 8:02 AM

## 2014-10-23 NOTE — Significant Event (Addendum)
Paged re: code @ 2:11am  Patient had been anxious much of the evening in anticipation of surgery. RN was about to give patient ativan to help her relax and suggested she get to the bed. Very shortly after the patient got in bed and was given the ativan she abruptly became unresponsive and pulseless. Code blue was called. ROSC was achieved after 4 min and 2 amps of epi. She was not responsive after ROSC and was intubated. ECG after arrest demonstrated NSR, RBBB, LAD, with inferior Qs and 1mm STE in V4 and V5 w/o reciprocal changes. ECG demonstrated dramatic axis shift (rightward to leftward) suggestive of lead misplacement.  Tele demonstrated that the patient was bradycardic in the 40s at the time of the code with an abnormal flutter like atrial rhythm. No significant pauses or tachycardia. ECG and case discussed with Dr. Herbie Baltimore; we agreed to hold off on cardiac cath, particularly in light of recent cath demonstrating no obstructive disease and the possibility of the code being 2/2 a CVA. . FU ECG demonstrated NSR, RBBB, RAD, and resolution of STE in V4/V5; results discussed with Dr. Herbie Baltimore. ABG after intubation returned at 7.244/65.9/65, 88%  Impression PEA, etiology unclear. Based on ABG, primary respiratory somewhat less likely Therapeutic anticoagulation makes PE less likely Based on recent cath and improved ECG, ischemia unlikely Need to R/O CVA, seizures Possibly code 2/2 multiple valve lesions and severe PH i/s/o milrinone and sildenafil  Plan CCM consult STAT head CT CBC, CMP, Mg TTE in AM Discontinue metoprolol Levophed for hypotension CXR to verify tube placement Hold heparin until intracranial hemorrhage has been ruled out EEG  I spoke with the patient's husband [name] [name] about these events. All questions were answered.

## 2014-10-23 NOTE — Consult Note (Signed)
PULMONARY / CRITICAL CARE MEDICINE   Name: [name] [name] MRN: [id number] DOB: Mar 14, 1955    ADMISSION DATE:  10-26-14 CONSULTATION DATE:  10/26/2014  REFERRING MD :  Dr. Shirlee Latch - Advanced heart failure  CHIEF COMPLAINT:  Cardiac arrest  INITIAL PRESENTATION:   STUDIES:  L/RHC > no significant coronary artery disease was found, severe pulmonary artery hypertension with cardiac output 3.5, cardiac index 1.7, pulmonary artery saturation 42%, severe mitral stenosis with valve area 0.58 cm TEE 10/5 > LVEF 50%, mild to mod AS, severe MS. RA and RV mod dilated.  CT head 10/11 >>>  SIGNIFICANT EVENTS: 10/5 admitted  10/9 diuresing well, started on sildenafil 10/10 milrinone started 10/11 early AM brief cardiac arrest, intubated, to ICU   HISTORY OF PRESENT ILLNESS:  59 year old female with PMH as below, which is significant for chronic atrial fib, HLD, obesity, and rheumatic heart disease, from which she has known mitral stenosis, aortic stenosis and diastolic heart failure. She is followed in the cardiology clinic by Dr. Patty Sermons. CT of abdomen and pelvis demonstrated large left atrial filling defect concerning for LA thrombus. This was subsequently confirmed on echocardiogram. She also had left renal artery occlusion seen on CT of abdomen as well which resulted in complete infarct of the left kidney and acute renal insufficiency which gradually resolved. TTE obtained on 09/04/2014 showed EF 50-55%, mild-to-moderate AS, severe MS, large thrombus in left atrium, severely reduced RVEF, moderate TR, peak PA pressure 64. It was felt that her new onset of atrial fibrillation was secondary to her severe MS. She was seen by Dr. Cornelius Moras and it was planned for her to undergo valve replacement surgery after being anticoagulated for at least 4-6 weeks. Prior to her discharge, she underwent a left and right heart catheterization as part of the preoperative workup, no significant coronary artery disease was  found, severe pulmonary artery hypertension with cardiac output 3.5, cardiac index 1.7, pulmonary artery saturation 42%, severe mitral stenosis with valve area 0.58 cm.   Since that discharge she has had trouble managing her volume status and lasix dosing had been changed, and she has also have variable INR levels despite reporting taking warfarin every day. She presented for TEE 10/5 and was noted to be in AF RVR. She was hypoxic with signs of volume overload. TEE showed EF 50%, intraventricular septum with D-shaped suggestive of RV pressure/volume overload, moderately dilated RV with mild to moderate systolic dysfunction, mitral valve was heavily calcified and rheumatic-appearing, severe mitral stenosis, moderate to severe tricuspid regurg, mild aortic stenosis, moderate right atrial enlargement, severe left atrial enlargement, very large left atrial thrombus. She was admitted to SDU for diuresis.    She was diuresing well on lasix and was being evaluated by CVTS. Plan was to go to OR 10/12 for mitral and aortic valve replacement using bioprosthetic tissue valves, removal of left atrial clot, tricuspid valve repair, and maze procedure. She was switched to IV amio to help maintain NSR and sildenafil and milrinone were added. She was feeling better and ambulating around the unit. She was also losing weight as volume status was much improved. 10/10 late PM she was feeling anxious. She said this was because she was having anxiety about her upcoming surgery on 10/12. She got back into bed and was ordered some ativan. Shortly after, she became became bradycardic and eventually went into PEA. A code blue was called and she suffered about 4 mins of downtime with 2 amps epi. Post ROSC she  was unresponsive and was intubated by CRNA. She was moved to ICU and PCCM consulted.   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  has past surgical history that includes Abdominal hysterectomy; Knee arthroscopy; Cardiac catheterization (N/A, 09/13/2014); and TEE without cardioversion (N/A, 11/05/2014). Prior to Admission medications   Medication Sig Start Date End Date Taking? Authorizing Provider  b complex vitamins capsule Take 1 capsule by mouth daily.   Yes Historical Provider, MD  cholecalciferol (VITAMIN D) 1000 UNITS tablet Take 2,000 Units by mouth daily.    Yes Historical Provider, MD  Coenzyme Q10 (COQ-10) 10 MG CAPS Take 1 capsule by mouth daily.   Yes Historical Provider, MD  diltiazem (CARDIZEM) 60 MG tablet Take 1 tablet (60 mg total) by mouth every 6 (six) hours. 09/18/14  Yes Joseph Art, DO  feeding supplement, ENSURE ENLIVE, (ENSURE ENLIVE) LIQD Take 237 mLs by mouth 3 (three) times daily between meals. 09/18/14  Yes Joseph Art, DO  furosemide (LASIX) 40 MG tablet Taking 1 1/2 tablets (60 mg) in the AM and 1 tablet ( ) in the PM 10/02/14  Yes Rosalio Macadamia, NP  magnesium chloride (SLOW-MAG) 64 MG TBEC SR tablet Take 1 tablet (64 mg total) by mouth 2 (two) times daily. 09/18/14  Yes Joseph Art, DO  metoprolol (LOPRESSOR) 50 MG tablet Take 1.5 tablets (75 mg total) by mouth 2 (two) times daily. 10/15/14  Yes Rosalio Macadamia, NP  mometasone-formoterol (DULERA) 100-5 MCG/ACT AERO Inhale 2 puffs into the lungs as needed for wheezing.   Yes Historical Provider, MD  morphine (MSIR) 15 MG tablet Take 1-2 tablets (15-30 mg total) by mouth every 4 (four) hours as needed for moderate pain or severe pain. 09/18/14  Yes Joseph Art, DO  pravastatin (PRAVACHOL) 40 MG tablet Take 40 mg by mouth daily.  09/11/13  Yes Historical Provider, MD  warfarin (COUMADIN) 7.5 MG tablet Take 1 tablet (7.5 mg total) by mouth daily at 6 PM. Patient taking differently: Take 3.75-7.5 mg by mouth daily at 6 PM. Patient takes 1/2  mg every day except on Monday patient takes 1 tablet ( 7.5 mg ) 09/18/14  Yes Joseph Art, DO  OMEGA-3 FATTY ACIDS PO Take 1,600 mg by mouth every Monday, Wednesday, and Friday.     Historical Provider, MD  pantoprazole (PROTONIX) 40 MG tablet Take 1 tablet (40 mg total) by mouth daily. Take 30-60 min before first meal of the day 08/28/14   Nyoka Cowden, MD   Allergies  Allergen Reactions  . Codeine Itching    FAMILY HISTORY:  indicated that her mother is deceased. She indicated that her father is deceased.  SOCIAL HISTORY:  reports that she has never smoked. She has never used smokeless tobacco. She reports that she drinks alcohol. She reports that she does not use illicit drugs.  REVIEW OF SYSTEMS:  unable  SUBJECTIVE:   VITAL SIGNS: Temp:  [97.5 F (36.4 C)-98.3 F (36.8 C)] 98 F (36.7 C) (10/11 0155) Pulse Rate:  [108] 108 (10/11 0155) Resp:  [15-29] 24 (10/11 0155) BP: (78-132)/(36-79) 114/58 mmHg (10/11 0155) SpO2:  [  91 %-97 %] 96 % (10/11 0155) FiO2 (%):  [100 %] 100 % (10/11 0246) Weight:  [102.74 kg (226 lb 8 oz)] 102.74 kg (226 lb 8 oz) (10/10 0400) HEMODYNAMICS:   VENTILATOR SETTINGS: Vent Mode:  [-] PRVC FiO2 (%):  [100 %] 100 % Set Rate:  [20 bmp] 20 bmp Vt Set:  [500 mL] 500 mL PEEP:  [5 cmH20] 5 cmH20 INTAKE / OUTPUT:  Intake/Output Summary (Last 24 hours) at 10/31/2014 0352 Last data filed at 10/22/14 2315  Gross per 24 hour  Intake 1442.38 ml  Output   1200 ml  Net 242.38 ml    PHYSICAL EXAMINATION: General:  Obese female on vent Neuro:  Comatose HEENT:  [place]/AT, no JVD noted, PERRL Cardiovascular:  RRR, 3/6 SEM Lungs:  Coarse bilateral rhonchi Abdomen:  Soft,  non-tender, non-distended Musculoskeletal:  No acute deformity. No edema Skin:  Grossly intact  LABS:  CBC  Recent Labs Lab 10/21/14 0607 10/22/14 0222 11/11/2014 0247  WBC 6.0 6.4 10.1  HGB 12.0 12.2 14.9  HCT 37.3 37.9 45.3  PLT 203 228 205   Coag's  Recent Labs Lab 10/20/14 0305 10/21/14 0607 10/22/14 0500  APTT  --   --  100*  INR 1.54* 1.32 1.25   BMET  Recent Labs Lab 10/21/14 0607 10/22/14 0222 10/28/2014 0247  NA 135 135 135  K 3.6 3.8 4.4  CL 95* 94* 96*  CO2 32 33* 25  BUN 18 21* 25*  CREATININE 1.61* 1.62* 1.75*  GLUCOSE 94 84 215*   Electrolytes  Recent Labs Lab 10/19/14 0257  10/21/14 0607 10/22/14 0222 10/18/2014 0247  CALCIUM 8.8*  < > 8.9 9.1 9.3  MG 2.0  --   --   --   --   < > = values in this interval not displayed. Sepsis Markers  Recent Labs Lab 11/07/2014 0257  LATICACIDVEN 7.9*   ABG  Recent Labs Lab 11/03/2014 0310  PHART 7.249*  PCO2ART 64.9*  PO2ART 63.0*   Liver Enzymes  Recent Labs Lab 11/07/2014 1415  AST 47*  ALT 46  ALKPHOS 123  BILITOT 1.1  ALBUMIN 3.2*   Cardiac Enzymes No results for input(s): TROPONINI, PROBNP in the last 168 hours. Glucose  Recent Labs Lab 11/01/2014 0213  GLUCAP 140*    Imaging Dg Chest 2 View  10/22/2014   CLINICAL DATA:  History of CHF. History of elevated white blood cell count.  EXAM: CHEST  2 VIEW  COMPARISON:  None.  FINDINGS: Cardiomegaly with mild bilateral from interstitial prominence. Congestive heart failure cannot be excluded. Pneumonitis cannot be excluded. Right base subsegmental atelectasis. No pneumothorax.  IMPRESSION: 1. Cardiomegaly with mild by pulmonary interstitial prominence suggesting mild congestive heart failure. Interstitial pneumonitis cannot be excluded.  2. Right base subsegmental atelectasis.   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 PAPs ~80. PVR 5 WU with recent RHC Pulmonary Edema  P:   Retract ETT 2 cm Full vent support Follow CXR Follow ABG Diuresis per cardiology  CARDIOVASCULAR L fem art line 10/11 >>> CVL to be placed A:  Cardiogenic shock > improved PEA cardiac arrest Large LA thrombus Acute on chronic diastolic CHF Rheumatic heart disease. Severe MS, AS Chronic Atrial fibrillation  P:  Cardiology primary Telemetry monitoring Levophed for MAP goal 65 (currently off)  Milrinone gtt Conitnue Amiodarone gtt EKG in AM Trend troponin Trend lactic acid Heparin as below Plan to OR for mitral and aortic valve replacement, removal of left atrial clot, tricuspid valve repair, and maze 10/12 Art line placed Place CVL Hold sildenafil, resume if able to come off pressors  RENAL A:   Acute renal failure L renal infarction in setting L atrial thrombus  P:   Follow Bmet Correct electrolytes as indicated.  Heparin as below  GASTROINTESTINAL A:   No acute issues  P:   NPO Pepcid for GI ppx  HEMATOLOGIC A:   Large L atrial thrombus ? PE  P:  Continue full dose heparin CTA chest when able V/Q very low yield Follow CBC  INFECTIOUS A:   ? Sepsis ? HCAP  P:   BCx2 10/11 >>> UC 10/11 >>> Sputum 10/11 >>> Abx: Zosyn, start date 10/11 Abx: Vancomycin, start date 10/11 PCT  ENDOCRINE A:   Hyperglycemia with no history of DM  P:   CBG monitoring and SSI  NEUROLOGIC A:   Acute encephalopathy. Likely multifactorial, hypoxic, anoxic, metabolic.   P:   RASS goal: 0 to -1 Fentanyl gtt PRN versed F/u CT head F/u EEG  FAMILY  - Updates: Family contacted by cardiology fellow 10/11 early AM  - Inter-disciplinary family meet or Palliative Care meeting due by:  10/18   Joneen Roach, AGACNP-BC [facility] Pager [phone] or [phone] 10/22/2014 4:17  AM   Attending Note:  I have examined patient, reviewed labs, studies and notes. I have discussed the case with Henreitta Leber, and I agree with the data and plans as amended above. 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 Sudden decompensation to PEA arrest. On my eval early am she was intubated, sedated but beginning to wake. On norepi + milrinone and with severe B pulm infiltrates in aftermath. Etiology of her decompensation unclear but suspect due to effects of ativan superimposed on her L and R heart disease in the setting of successful diuresis and addition of sildenafil. Need to consider also neuro event and portable CT head was just completed this am, results pending. Will follow this result, discuss events with cardiology, particularly regarding continuation of sildenafil, any further diuresis while in shock, adjustment milrinone etc.  Independent critical care time is 60 minutes.   Levy Pupa, MD, PhD 10/15/2014, 7:45 AM [facility] [phone] or if no answer [phone]

## 2014-10-23 NOTE — Progress Notes (Addendum)
Patient ID: [name] [name], female   DOB: 27-Nov-1955, 59 y.o.   MRN: [id number]   SUBJECTIVE:  Sildenafil started 10/20/14, milrinone at low dose on 10/10.   Earlier this morning had cardiac arrest, PEA. Required 2 epinephrine and CPR for 7 min. CT of head negative. Intubated. Lost pulse in LLE. Now on amiodarone gtt, norepi 15 mcg, and milrinone 0.125 mcg.   Lasix 80 mg IV earlier this morning with poor response.  PCT 7.29, on empiric antibiotics.  CXR with diffuse suspected pulmonary edema.    Echo 10/19/14 with mild to moderate AS (mean gradient 18 mmHG)   Scheduled Meds: . antiseptic oral rinse  15 mL Mouth Rinse QID  . antiseptic oral rinse  7 mL Mouth Rinse QID  . B-complex with vitamin C  1 tablet Oral Daily  . chlorhexidine gluconate  15 mL Mouth/Throat BID  . cholecalciferol  2,000 Units Oral Daily  . famotidine (PEPCID) IV  20 mg Intravenous Q24H  . feeding supplement (ENSURE ENLIVE)  237 mL Oral TID BM  . fentaNYL (SUBLIMAZE) injection  50 mcg Intravenous Once  . insulin aspart  2-6 Units Subcutaneous 6 times per day  . magnesium chloride  1 tablet Oral BID  . piperacillin-tazobactam (ZOSYN)  IV  3.375 g Intravenous Q8H  . pravastatin  40 mg Oral Daily  . sodium chloride  3 mL Intravenous Q12H  . torsemide  40 mg Oral Daily  . [START ON 10/14/2014] vancomycin  1,250 mg Intravenous Q24H  . vancomycin  2,000 mg Intravenous Once   Continuous Infusions: . amiodarone 30 mg/hr (10/22/14 0445)  . amiodarone 30 mg/hr (2014-11-22 0005)  . fentaNYL infusion INTRAVENOUS 50 mcg/hr (11/22/2014 0452)  . heparin 1,350 Units/hr (11/22/2014 0654)  . milrinone 0.125 mcg/kg/min (2014-11-22 0023)  . norepinephrine (LEVOPHED) Adult infusion     PRN Meds:.sodium chloride, acetaminophen, benzonatate, fentaNYL, midazolam, midazolam, mometasone-formoterol, sodium chloride   Filed Vitals:   Nov 22, 2014 0600 2014/11/22 0615 11/22/2014 0630 22-Nov-2014 0645  BP:      Pulse: 93 91 88 45  Temp:       TempSrc:      Resp: Height:      Weight:      SpO2: 90% 91% 100% 98%    Intake/Output Summary (Last 24 hours) at November 22, 2014 0718 Last data filed at 11-22-2014 0600  Gross per 24 hour  Intake 1455.45 ml  Output   1200 ml  Net 255.45 ml    LABS: Basic Metabolic Panel:  Recent Labs  40/98/11 0222 22-Nov-2014 0247 22-Nov-2014 0531  NA 135 135  --   K 3.8 4.4  --   CL 94* 96*  --   CO2 33* 25  --   GLUCOSE 84 215*  --   BUN 21* 25*  --   CREATININE 1.62* 1.75*  --   CALCIUM 9.1 9.3  --   MG  --   --  1.9  PHOS  --   --  4.8*   Liver Function Tests: No results for input(s): AST, ALT, ALKPHOS, BILITOT, PROT, ALBUMIN in the last 72 hours. No results for input(s): LIPASE, AMYLASE in the last 72 hours. CBC:  Recent Labs  10/22/14 0222 2014-11-22 0247  WBC 6.4 10.1  HGB 12.2 14.9  HCT 37.9 45.3  MCV 88.1 89.7  PLT 228 205   Cardiac Enzymes:  Recent Labs  2014-11-22 0531  TROPONINI 0.43*   BNP: Invalid input(s): POCBNP D-Dimer: No results for  input(s): DDIMER in the last 72 hours. Hemoglobin A1C: No results for input(s): HGBA1C in the last 72 hours. Fasting Lipid Panel: No results for input(s): CHOL, HDL, LDLCALC, TRIG, CHOLHDL, LDLDIRECT in the last 72 hours. Thyroid Function Tests: No results for input(s): TSH, T4TOTAL, T3FREE, THYROIDAB in the last 72 hours.  Invalid input(s): FREET3 Anemia Panel: No results for input(s): VITAMINB12, FOLATE, FERRITIN, TIBC, IRON, RETICCTPCT in the last 72 hours.  RADIOLOGY: Dg Chest 2 View  10/22/2014   CLINICAL DATA:  History of CHF. History of elevated white blood cell count.  EXAM: CHEST  2 VIEW  COMPARISON:  None.  FINDINGS: Cardiomegaly with mild bilateral from interstitial prominence. Congestive heart failure cannot be excluded. Pneumonitis cannot be excluded. Right base subsegmental atelectasis. No pneumothorax.  IMPRESSION: 1. Cardiomegaly with mild by pulmonary interstitial prominence suggesting mild  congestive heart failure. Interstitial pneumonitis cannot be excluded.  2. Right base subsegmental atelectasis.   Electronically Signed   By: Maisie Fus  Register   On: 10/22/2014 07:21   Dg Chest Port 1 View  Nov 10, 2014   CLINICAL DATA:  Hypoxia  EXAM: PORTABLE CHEST 1 VIEW  COMPARISON:  November 10, 2014  FINDINGS: Endotracheal tube tip is 1.2 cm above the carina. Nasogastric tube tip and side port are in the stomach. Central catheter tip is at the cavoatrial junction. No pneumothorax. There is widespread airspace disease throughout the lungs bilaterally. Heart is mildly enlarged. The pulmonary vascularity is grossly normal. No adenopathy apparent.  IMPRESSION: Tube and catheter positions as described without pneumothorax. Note that the endotracheal tube tip is close to although above the carina. Widespread airspace consolidation remains bilaterally. Suspect diffuse congestive heart failure, although widespread pneumonia or hemorrhage could present similarly under differential considerations. More than one of these entities may exist concurrently. There may also be a degree of superimposed ARDS.   Electronically Signed   By: Bretta Bang III M.D.   On: 11/10/14 06:58   Dg Chest Port 1 View  November 10, 2014   CLINICAL DATA:  Code with intubation.  EXAM: PORTABLE CHEST 1 VIEW  COMPARISON:  10/22/2014  FINDINGS: Endotracheal tube is been placed with tip measuring about 1.9 cm above the carina. This could be retracted about 1-2 cm for better placement. Shallow inspiration. Normal heart size and pulmonary vascularity. Diffuse bilateral airspace disease with air bronchograms demonstrated throughout both lungs with a mostly perihilar distribution. This demonstrates progression since previous study. This could represent edema, bilateral pneumonia, or hemorrhage. Probable right pleural effusion. No visible pneumothorax.  IMPRESSION: Endotracheal tube tip measures 1.9 cm above the carina. Increasing diffuse bilateral  parenchymal infiltration in the lungs. Probable right pleural effusion.   Electronically Signed   By: Burman Nieves M.D.   On: 11/10/2014 03:58   Dg Abd Portable 1v  2014/11/10   CLINICAL DATA:  OG tube placement.  EXAM: PORTABLE ABDOMEN - 1 VIEW  COMPARISON:  09/03/2014  FINDINGS: Enteric tube tip is off the field of view. Tube courses through the expected location of the stomach with tip likely in or below the distal stomach. Stomach is moderately gas distended. Examination is technically limited due to motion artifact.  IMPRESSION: Enteric tube tip is not visualized off the field of view but the catheter extends at least to the distal stomach. Gaseous distention of the stomach.   Electronically Signed   By: Burman Nieves M.D.   On: 11/10/2014 04:51   Ct Portable Head W/o Cm  Nov 10, 2014   CLINICAL DATA:  Altered mental  status  EXAM: CT HEAD WITHOUT CONTRAST  TECHNIQUE: Contiguous axial images were obtained from the base of the skull through the vertex without intravenous contrast.  COMPARISON:  MRI brain 09/06/2014  FINDINGS: Examination is technically limited due to patient motion, nonstandard positioning, an due to extensive streak artifact arising from dental hardware. As visualize, there is no gross evidence of acute intracranial hemorrhage or mass effect. Visualized gray-white matter junctions are distinct but can't completely exclude parenchymal disease. No ventricular dilatation. No abnormal extra-axial fluid collections. Visualized calvarium appears intact. Visualized paranasal sinuses are not opacified. Endotracheal and enteric tubes are in place.  IMPRESSION: Technically limited study. No gross evidence of acute intracranial hemorrhage, mass effect, or ventricular dilatation. Diffuse parenchymal disease cannot be excluded due to technique.   Electronically Signed   By: Burman Nieves M.D.   On: 10/17/2014 04:59    PHYSICAL EXAM General: Intubated Neck: JVP difficult (intubated), no  thyromegaly or thyroid nodule noted [facility]  Lungs: CTA, Normal effort. CV: Nondisplaced PMI.  Heart regular S1/S2, no S3/S4, 3/6 SEM RUSB, 1/6 diastolic murmur.  No edema  Abdomen: Obese. soft, NT, ND, no HSM Neurologic: Alert and oriented x 3.  Psych: Normal affect. Extremities: No clubbing or cyanosis.   TELEMETRY: Sinus Rhythm with bigeminal PACs.   ASSESSMENT AND PLAN: 59 yo with severe rheumatic mitral stenosis, pulmonary hypertension, RV failure, atrial fibrillation, and very large LA thrombus was admitted with atrial fibrillation/RVR and acute/chronic diastolic CHF.   1. Acute on chronic diastolic CHF: LV EF 50% with D-shaped IV septum and moderately dilated RV with moderate to severe systolic dysfunction. She has NYHA class IIIb-IV symptoms at home.  Patient was severely volume overloaded in the setting of severe mitral stenosis with pulmonary hypertension and RV failure. Last night had episode consistent with flash pulmonary edema. - Stop Revatio - Stop torsemide. Start lasix 80 mg IV twice a day, give dose later this morning.  - Set up CVP. Check co-ox.  2. Atrial fibrillation: Large LA thrombus, does not appear to have improved with several weeks of anticoagulation (though she has been subtherapeutic at times). Spontaneously converted to NSR 10/20/14 and was started on amio. Has had brief episodes of AF. Today in NSR with PACs.   - Heparin gtt  - Continue IV amiodarone. - Maze + LA clot removal at surgery.   3. Mitral stenosis: Severe by TEE, likely rheumatic.She additionally has mild-moderate aortic stenosis and will need bioprosthetic AVR.Discussed with Dr Cornelius Moras => very large LA thrombus appears unlikely to resolve any time soon with anticoagulation. Plan valve surgery eventually along with thrombus retrieval and Maze.She has had cath recently showing no CAD.  4. Left renal artery infarction: Creatinine up mildly. 5. Pulmonary hypertension: Mixed pulmonary venous and pulmonary  arterial HTN. PAPs ~80. PVR 5 WU with recent RHC. Suspect component of PAH from reactive pulmonary vascular changes in face of long-standing MS. Given degree of PH and RV failure, suspect surgery will be high risk.  - Off sildenafil due to pulmonary edema/hypotension.  - Continue milrinone 0.125 mcg/kg/min. Check co-ox now.  6. Aortic stenosis: Mild to moderate on echo, plan for AVR with surgery.  7. No pulses left leg:  Arterial dopplers now as we are unable to find pulse on the left, ?cardioembolism.  8. PEA: 10/11. S/P CPR this am requiring epinephrine. 9. Acute Respiratory Failure: Intubated earlier this morning, pulmonary edema.   10. ID: Procalcitonin elevated, covering with empiric antibiotics.  11. Altered mental status: Head  CT negative.   Amy Clegg NP-C  10/20/2014 7:18 AM  Advanced Heart Failure Team Pager [phone] (M-F; 7a - 4p)  Please contact [facility] for night-coverage after hours (4p -7a ) and weekends on [email or web address]   Patient seen with NP, agree with the above note.   Suspect flash pulmonary edema last night in the setting of severe mitral stenosis with PAH.  Now on norepinephrine and milrinone gtts.  Will follow co-ox and CVP.  Stop Revatio.  Will attempt to diurese, start back on IV Lasix.  Surgery will need to be on hold until more stable.   45 minutes critical care time.   Marca Ancona 10/16/2014 8:03 AM  Right popliteal thrombus on dopplers, consult vascular.   Poor UOP, CVP 6.  Hold Lasix.  Good co-ox at 68%.  Remains on norepinephrine and milrinone 0.125.  With low pressure and good co-ox, will stop milrinone for now.  Follow co-ox.   Marca Ancona 10/20/2014 1:44 PM

## 2014-10-23 NOTE — Progress Notes (Signed)
  Echocardiogram 2D Echocardiogram has been performed.  Janalyn Harder 11/11/2014, 11:51 AM

## 2014-10-23 NOTE — Progress Notes (Signed)
EEG Completed; Results Pending  

## 2014-10-23 NOTE — Progress Notes (Signed)
eLink Physician-Brief Progress Note Patient Name: [name] [name] DOB: February 07, 1955 MRN: [id number]   Date of Service  2014-10-30  HPI/Events of Note  No popleteal, DP or PT pulses in R leg. Leg cold and painful.   eICU Interventions  Will order arterial doppler study of RLE.     Intervention Category Major Interventions: Other:  Lenell Antu Oct 30, 2014, 6:51 AM

## 2014-10-24 ENCOUNTER — Encounter (HOSPITAL_COMMUNITY): Admission: AD | Disposition: E | Payer: Self-pay | Source: Ambulatory Visit | Attending: Cardiology

## 2014-10-24 ENCOUNTER — Encounter (HOSPITAL_COMMUNITY): Payer: Self-pay | Admitting: Vascular Surgery

## 2014-10-24 ENCOUNTER — Inpatient Hospital Stay (HOSPITAL_COMMUNITY): Payer: Commercial Managed Care - HMO

## 2014-10-24 DIAGNOSIS — I639 Cerebral infarction, unspecified: Secondary | ICD-10-CM

## 2014-10-24 DIAGNOSIS — G935 Compression of brain: Secondary | ICD-10-CM

## 2014-10-24 DIAGNOSIS — Z7189 Other specified counseling: Secondary | ICD-10-CM

## 2014-10-24 DIAGNOSIS — Z515 Encounter for palliative care: Secondary | ICD-10-CM

## 2014-10-24 DIAGNOSIS — J939 Pneumothorax, unspecified: Secondary | ICD-10-CM

## 2014-10-24 DIAGNOSIS — I6349 Cerebral infarction due to embolism of other cerebral artery: Secondary | ICD-10-CM

## 2014-10-24 LAB — URINE CULTURE: CULTURE: NO GROWTH

## 2014-10-24 LAB — CBC
HEMATOCRIT: 35.2 % — AB (ref 36.0–46.0)
Hemoglobin: 11.8 g/dL — ABNORMAL LOW (ref 12.0–15.0)
MCH: 28.4 pg (ref 26.0–34.0)
MCHC: 33.5 g/dL (ref 30.0–36.0)
MCV: 84.6 fL (ref 78.0–100.0)
PLATELETS: 203 10*3/uL (ref 150–400)
RBC: 4.16 MIL/uL (ref 3.87–5.11)
RDW: 16.8 % — AB (ref 11.5–15.5)
WBC: 17.5 10*3/uL — ABNORMAL HIGH (ref 4.0–10.5)

## 2014-10-24 LAB — CARBOXYHEMOGLOBIN
Carboxyhemoglobin: 1.3 % (ref 0.5–1.5)
Carboxyhemoglobin: 1.9 % — ABNORMAL HIGH (ref 0.5–1.5)
METHEMOGLOBIN: 0.9 % (ref 0.0–1.5)
Methemoglobin: 0.8 % (ref 0.0–1.5)
O2 Saturation: 90.9 %
O2 Saturation: 86.5 %
TOTAL HEMOGLOBIN: 11.1 g/dL — AB (ref 12.0–16.0)
Total hemoglobin: 11.9 g/dL — ABNORMAL LOW (ref 12.0–16.0)

## 2014-10-24 LAB — GLUCOSE, CAPILLARY
GLUCOSE-CAPILLARY: 112 mg/dL — AB (ref 65–99)
GLUCOSE-CAPILLARY: 122 mg/dL — AB (ref 65–99)
GLUCOSE-CAPILLARY: 128 mg/dL — AB (ref 65–99)
GLUCOSE-CAPILLARY: 150 mg/dL — AB (ref 65–99)
GLUCOSE-CAPILLARY: 153 mg/dL — AB (ref 65–99)
Glucose-Capillary: 114 mg/dL — ABNORMAL HIGH (ref 65–99)
Glucose-Capillary: 123 mg/dL — ABNORMAL HIGH (ref 65–99)

## 2014-10-24 LAB — BASIC METABOLIC PANEL
Anion gap: 15 (ref 5–15)
BUN: 32 mg/dL — ABNORMAL HIGH (ref 6–20)
CALCIUM: 9.1 mg/dL (ref 8.9–10.3)
CO2: 26 mmol/L (ref 22–32)
CREATININE: 2.15 mg/dL — AB (ref 0.44–1.00)
Chloride: 94 mmol/L — ABNORMAL LOW (ref 101–111)
GFR calc Af Amer: 28 mL/min — ABNORMAL LOW (ref >60)
GFR calc non Af Amer: 24 mL/min — ABNORMAL LOW (ref >60)
GLUCOSE: 164 mg/dL — AB (ref 65–99)
Potassium: 4.5 mmol/L (ref 3.5–5.1)
Sodium: 135 mmol/L (ref 135–145)

## 2014-10-24 LAB — POCT I-STAT 3, ART BLOOD GAS (G3+)
Acid-Base Excess: 6 mmol/L — ABNORMAL HIGH (ref 0.0–2.0)
Bicarbonate: 29.8 mEq/L — ABNORMAL HIGH (ref 20.0–24.0)
O2 SAT: 100 %
PCO2 ART: 38.5 mmHg (ref 35.0–45.0)
PO2 ART: 161 mmHg — AB (ref 80.0–100.0)
Patient temperature: 97.8
TCO2: 31 mmol/L (ref 0–100)
pH, Arterial: 7.494 — ABNORMAL HIGH (ref 7.350–7.450)

## 2014-10-24 LAB — HEPARIN LEVEL (UNFRACTIONATED): Heparin Unfractionated: 0.48 IU/mL (ref 0.30–0.70)

## 2014-10-24 LAB — POCT I-STAT 7, (LYTES, BLD GAS, ICA,H+H)
Acid-Base Excess: 1 mmol/L (ref 0.0–2.0)
Bicarbonate: 28.6 mEq/L — ABNORMAL HIGH (ref 20.0–24.0)
Calcium, Ion: 1.21 mmol/L (ref 1.12–1.23)
HEMATOCRIT: 42 % (ref 36.0–46.0)
HEMOGLOBIN: 14.3 g/dL (ref 12.0–15.0)
O2 Saturation: 98 %
PCO2 ART: 52.6 mmHg — AB (ref 35.0–45.0)
PO2 ART: 111 mmHg — AB (ref 80.0–100.0)
POTASSIUM: 4.2 mmol/L (ref 3.5–5.1)
Sodium: 135 mmol/L (ref 135–145)
TCO2: 30 mmol/L (ref 0–100)
pH, Arterial: 7.338 — ABNORMAL LOW (ref 7.350–7.450)

## 2014-10-24 LAB — PROTIME-INR
INR: 1.49 (ref 0.00–1.49)
PROTHROMBIN TIME: 18.1 s — AB (ref 11.6–15.2)

## 2014-10-24 LAB — PROCALCITONIN: Procalcitonin: 40.24 ng/mL

## 2014-10-24 SURGERY — REPLACEMENT, MITRAL VALVE
Anesthesia: General | Site: Chest

## 2014-10-24 MED ORDER — POLYVINYL ALCOHOL 1.4 % OP SOLN
1.0000 [drp] | OPHTHALMIC | Status: DC | PRN
  Filled 2014-10-24: qty 15

## 2014-10-24 MED ORDER — FUROSEMIDE 10 MG/ML IJ SOLN
80.0000 mg | Freq: Once | INTRAMUSCULAR | Status: AC
  Administered 2014-10-24: 80 mg via INTRAVENOUS
  Filled 2014-10-24: qty 8

## 2014-10-24 NOTE — Progress Notes (Signed)
TCTS BRIEF PROGRESS NOTE   Events of last 48 hours and patient's current condition discussed with her family at the bedside.  Prognosis is poor.  All questions answered.  Purcell Nailslarence H Owen, MD 11/01/2014 9:49 AM

## 2014-10-24 NOTE — Progress Notes (Signed)
eLink Physician-Brief Progress Note Patient Name: [name] [name] DOB: December 02, 1955 MRN: [id number]   Date of Service  10/16/2014  HPI/Events of Note  ABG reviewed  eICU Interventions  Reduce RR to 18 and PEEP to 10.     Intervention Category Intermediate Interventions: Other:  [name] 11/10/2014, 9:24 PM

## 2014-10-24 NOTE — Progress Notes (Signed)
Family meeting in consultation room. HF MD and Palliative care MD present to answer questions and give support. No change in code status at this time. Family expressed understanding of patients condition and requested time to make decisions. Will continue to monitor.

## 2014-10-24 NOTE — Progress Notes (Signed)
[facility], [facility] [place], called and notified of patient and condition. A referral number of [id number] was given.    Also, [place] at bedside.

## 2014-10-24 NOTE — Progress Notes (Signed)
Patient ID: [name] [name], female   DOB: Apr 29, 1955, 59 y.o.   MRN: [id number]   SUBJECTIVE:  Sildenafil started 10/20/14, milrinone at low dose on 10/10.   10/11 cardiac arrest, PEA. Required 2 epinephrine and CPR for 7 min. CT of head negative. Intubated. Lost pulse in RLE. Vascular consulted for popliteal thrombus. S/P successful thrombectomy.   Now on amiodarone gtt and norepi 20 mcg.  This morning not responding to painful stimuli. Pupils fixed on exam.   Limited echo October 30, 2014 EF 65-70% L atrial thrombus looks the same and not obstructing the mitral valve, similar severe MV stenosis, dilated and dysfunctional RV.   Scheduled Meds: . antiseptic oral rinse  15 mL Mouth Rinse QID  . antiseptic oral rinse  7 mL Mouth Rinse Q2H while awake  . B-complex with vitamin C  1 tablet Oral Daily  . chlorhexidine gluconate  15 mL Mouth/Throat BID  . cholecalciferol  2,000 Units Oral Daily  . feeding supplement (ENSURE ENLIVE)  237 mL Oral TID BM  . fentaNYL (SUBLIMAZE) injection  50 mcg Intravenous Once  . insulin aspart  2-6 Units Subcutaneous 6 times per day  . pantoprazole (PROTONIX) IV  40 mg Intravenous QHS  . piperacillin-tazobactam (ZOSYN)  IV  3.375 g Intravenous Q8H  . pravastatin  40 mg Oral Daily  . sodium chloride  3 mL Intravenous Q12H  . vancomycin  1,250 mg Intravenous Q24H   Continuous Infusions: . amiodarone 30 mg/hr (11/01/2014 0449)  . amiodarone 30 mg/hr (October 30, 2014 1519)  . fentaNYL infusion INTRAVENOUS Stopped (11/07/2014 0000)  . heparin 1,350 Units/hr (10-30-14 2057)  . norepinephrine (LEVOPHED) Adult infusion 20 mcg/min (10/29/2014 4098)  . vasopressin (PITRESSIN) infusion - *FOR SHOCK*     PRN Meds:.sodium chloride, acetaminophen, benzonatate, fentaNYL, midazolam, midazolam, mometasone-formoterol, sodium chloride   Filed Vitals:   11/02/2014 0400 11/09/2014 0500 11/04/2014 0523 10/30/2014 0600  BP:   112/64   Pulse: 78 76 76 73  Temp: 97.6 F (36.4 C)     TempSrc: Oral      Resp: 20 20  20   Height:      Weight:      SpO2: 99% 100% 100% 100%    Intake/Output Summary (Last 24 hours) at 11/01/2014 0708 Last data filed at 11/10/2014 0616  Gross per 24 hour  Intake 2803.48 ml  Output   1301 ml  Net 1502.48 ml    LABS: Basic Metabolic Panel:  Recent Labs  11/91/47 0531 10-30-2014 1325 10/23/2014 0400  NA  --  132* 135  K  --  4.1 4.5  CL  --  94* 94*  CO2  --  28 26  GLUCOSE  --  134* 164*  BUN  --  28* 32*  CREATININE  --  2.06* 2.15*  CALCIUM  --  9.1 9.1  MG 1.9  --   --   PHOS 4.8*  --   --    Liver Function Tests: No results for input(s): AST, ALT, ALKPHOS, BILITOT, PROT, ALBUMIN in the last 72 hours. No results for input(s): LIPASE, AMYLASE in the last 72 hours. CBC:  Recent Labs  2014-10-30 0247 11/06/2014 0400  WBC 10.1 17.5*  HGB 14.9 11.8*  HCT 45.3 35.2*  MCV 89.7 84.6  PLT 205 203   Cardiac Enzymes:  Recent Labs  2014-10-30 0531 30-Oct-2014 1325 2014/10/30 1615  TROPONINI 0.43* 0.51* 0.43*   BNP: Invalid input(s): POCBNP D-Dimer: No results for input(s): DDIMER in the last 72 hours. Hemoglobin A1C:  No results for input(s): HGBA1C in the last 72 hours. Fasting Lipid Panel: No results for input(s): CHOL, HDL, LDLCALC, TRIG, CHOLHDL, LDLDIRECT in the last 72 hours. Thyroid Function Tests: No results for input(s): TSH, T4TOTAL, T3FREE, THYROIDAB in the last 72 hours.  Invalid input(s): FREET3 Anemia Panel: No results for input(s): VITAMINB12, FOLATE, FERRITIN, TIBC, IRON, RETICCTPCT in the last 72 hours.  RADIOLOGY: Dg Chest 2 View  10/22/2014  CLINICAL DATA:  History of CHF. History of elevated white blood cell count. EXAM: CHEST  2 VIEW COMPARISON:  None. FINDINGS: Cardiomegaly with mild bilateral from interstitial prominence. Congestive heart failure cannot be excluded. Pneumonitis cannot be excluded. Right base subsegmental atelectasis. No pneumothorax. IMPRESSION: 1. Cardiomegaly with mild by pulmonary interstitial  prominence suggesting mild congestive heart failure. Interstitial pneumonitis cannot be excluded. 2. Right base subsegmental atelectasis. Electronically Signed   By: Maisie Fushomas  Register   On: 10/22/2014 07:21   Dg Chest Port 1 View  March 27, 2014  CLINICAL DATA:  Hypoxia EXAM: PORTABLE CHEST 1 VIEW COMPARISON:  October 23, 2014 FINDINGS: Endotracheal tube tip is 1.2 cm above the carina. Nasogastric tube tip and side port are in the stomach. Central catheter tip is at the cavoatrial junction. No pneumothorax. There is widespread airspace disease throughout the lungs bilaterally. Heart is mildly enlarged. The pulmonary vascularity is grossly normal. No adenopathy apparent. IMPRESSION: Tube and catheter positions as described without pneumothorax. Note that the endotracheal tube tip is close to although above the carina. Widespread airspace consolidation remains bilaterally. Suspect diffuse congestive heart failure, although widespread pneumonia or hemorrhage could present similarly under differential considerations. More than one of these entities may exist concurrently. There may also be a degree of superimposed ARDS. Electronically Signed   By: Bretta BangWilliam  Woodruff III M.D.   On: March 27, 2014 06:58   Dg Chest Port 1 View  March 27, 2014  CLINICAL DATA:  Code with intubation. EXAM: PORTABLE CHEST 1 VIEW COMPARISON:  10/22/2014 FINDINGS: Endotracheal tube is been placed with tip measuring about 1.9 cm above the carina. This could be retracted about 1-2 cm for better placement. Shallow inspiration. Normal heart size and pulmonary vascularity. Diffuse bilateral airspace disease with air bronchograms demonstrated throughout both lungs with a mostly perihilar distribution. This demonstrates progression since previous study. This could represent edema, bilateral pneumonia, or hemorrhage. Probable right pleural effusion. No visible pneumothorax. IMPRESSION: Endotracheal tube tip measures 1.9 cm above the carina. Increasing diffuse  bilateral parenchymal infiltration in the lungs. Probable right pleural effusion. Electronically Signed   By: Burman NievesWilliam  Stevens M.D.   On: March 27, 2014 03:58   Dg Abd Portable 1v  March 27, 2014  CLINICAL DATA:  OG tube placement. EXAM: PORTABLE ABDOMEN - 1 VIEW COMPARISON:  09/03/2014 FINDINGS: Enteric tube tip is off the field of view. Tube courses through the expected location of the stomach with tip likely in or below the distal stomach. Stomach is moderately gas distended. Examination is technically limited due to motion artifact. IMPRESSION: Enteric tube tip is not visualized off the field of view but the catheter extends at least to the distal stomach. Gaseous distention of the stomach. Electronically Signed   By: Burman NievesWilliam  Stevens M.D.   On: March 27, 2014 04:51   Ct Portable Head W/o Cm  March 27, 2014  CLINICAL DATA:  Altered mental status EXAM: CT HEAD WITHOUT CONTRAST TECHNIQUE: Contiguous axial images were obtained from the base of the skull through the vertex without intravenous contrast. COMPARISON:  MRI brain 09/06/2014 FINDINGS: Examination is technically limited due to patient motion, nonstandard  positioning, an due to extensive streak artifact arising from dental hardware. As visualize, there is no gross evidence of acute intracranial hemorrhage or mass effect. Visualized gray-white matter junctions are distinct but can't completely exclude parenchymal disease. No ventricular dilatation. No abnormal extra-axial fluid collections. Visualized calvarium appears intact. Visualized paranasal sinuses are not opacified. Endotracheal and enteric tubes are in place. IMPRESSION: Technically limited study. No gross evidence of acute intracranial hemorrhage, mass effect, or ventricular dilatation. Diffuse parenchymal disease cannot be excluded due to technique. Electronically Signed   By: Burman Nieves M.D.   On: 25-Oct-2014 04:59    PHYSICAL EXAM CVP 10 General: Intubated Neck: JVP difficult (intubated), no  thyromegaly or thyroid nodule noted [facility]  Lungs: CTA, Normal effort. CV: Nondisplaced PMI.  Heart regular S1/S2, no S3/S4, 3/6 SEM RUSB, 1/6 diastolic murmur.  No edema  Abdomen: Obese. soft, NT, ND, no HSM Neurologic:Intubated. Not responding to painful stimuli. Pupils fixed/dilated.  Psych: On vent  Extremities: No clubbing or cyanosis. R and LLE + pulses  TELEMETRY: Sinus Rhythm 70s.   ASSESSMENT AND PLAN: 59 yo with severe rheumatic mitral stenosis, pulmonary hypertension, RV failure, atrial fibrillation, and very large LA thrombus was admitted with atrial fibrillation/RVR and acute/chronic diastolic CHF.   1. Acute on chronic diastolic CHF: LV EF 50% with D-shaped IV septum and moderately dilated RV with moderate to severe systolic dysfunction. She has NYHA class IIIb-IV symptoms at home.  Patient was severely volume overloaded in the setting of severe mitral stenosis with pulmonary hypertension and RV failure.She had an episode Monday night consistent with flash pulmonary edema. - Off revatio with hypotension.  - Now on norepi 20 mcg.  CVP 10. Give dose 80 mg IV lasix now.  - CO-OX 90% (inaccurate) so will repeat.  -2. Atrial fibrillation: Large LA thrombus, does not appear to have improved with several weeks of anticoagulation (though she has been subtherapeutic at times). Spontaneously converted to NSR 10/20/14 and was started on amio. Has had brief episodes of AF. Today in NSR with PACs.   - Heparin gtt continues - Continue IV amiodarone. - Had planned for surgery with MVR/AVR/Maze + LA clot removal at surgery) but this is on hold due to declining status.  3. Mitral stenosis: Severe by TEE, likely rheumatic.She additionally has mild-moderate aortic stenosis and will need bioprosthetic AVR.Discussed with Dr Cornelius Moras => very large LA thrombus appears unlikely to resolve any time soon with anticoagulation. Planned valve surgery eventually along with thrombus retrieval and Maze but this is  on hold.She has had cath recently showing no CAD.  Limited echo done yesterday to make sure thrombus had not migrated to obstruct mitral valve => no apparent change.  4. 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  Creatinine up to 2.1 in setting of cardiac arrest. 5. 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 PAPs ~80. PVR 5 WU with recent RHC. Suspect component of PAH from reactive pulmonary vascular changes in face of long-standing MS. Given degree of PH and RV failure, suspect surgery will be high risk.  - Off milrinone and sildenafil due to hypotension.  6. Aortic stenosis: Mild to moderate on echo, plan for AVR with surgery.  7. PEA: 10/11. S/P CPR requiring epinephrine. 7 min CPR  8. Acute Respiratory Failure: Intubated 10/11.   9. ID: Procalcitonin elevated, covering with empiric antibiotics.  10. Altered mental status: Pupils fixed this morning. Concern for cardioembolic CVA.  Stat CT of head  without contrast. Consult Neuro.  11. R Popliteal Thrombus: Cardioembolic.  S/P Embolectomy 10/11.   Amy Clegg NP-C  11/10/2014 7:08 AM  Advanced Heart Failure Team Pager [phone] (M-F; 7a - 4p)  Please contact [facility] for night-coverage after hours (4p -7a ) and weekends on [email or web address]  Patient seen with NP, agree with the above note.  She tolerated right popliteal embolectomy (cardioembolic) yesterday without complication. However, this morning pupils are fixed/dilated and she does not respond to painful stimuli.  CT head yesterday am did not show acute change, but clear change now since then on neuro exam.  Sending for stat head CT, neuro consulted.  I am afraid that this is going to be a large CVA.   BP stable this morning on norepinephrine.  Wean as tolerated but with suspected CVA will keep MAP >/= 65.  CVP 10, will give Lasix 80 mg IV x 1.  Need to repeat co-ox.   Remains in NSR this morning on amiodarone, continue.    Empiric abx continue per CCM.   45 minutes critical care time.   Marca Ancona 10/31/2014 7:46 AM

## 2014-10-24 NOTE — Progress Notes (Addendum)
Cards Fellow, Onalee HuaAlvarez updated on neurological status of pt. Avallone is no longer withdrawing from pain and is unresponsive, GCS of 3.   No new orders.

## 2014-10-24 NOTE — Progress Notes (Signed)
Pt transported to CT on vent 

## 2014-10-24 NOTE — Progress Notes (Signed)
eLink Physician-Brief Progress Note Patient Name: [name] [name] DOB: 1955-07-28 MRN: [id number]   Date of Service  10/28/2014  HPI/Events of Note  Dry eyes. Nurse is requesting eye lubricant  eICU Interventions  Ordered     Intervention Category Minor Interventions: Other:  [name] 10/16/2014, 5:17 PM

## 2014-10-24 NOTE — Progress Notes (Signed)
   10/24/2014 1200  Clinical Encounter Type  Visited With Patient;Family;Patient and family together;Health care provider  Visit Type Initial;Follow-up;Psychological support;Spiritual support;Social support;Critical Care  Referral From Nurse;Care management  Consult/Referral To Chaplain;Faith community  Spiritual Encounters  Spiritual Needs Literature;Prayer;Emotional  Stress Factors  Family Stress Factors Lack of knowledge;Loss of control;Major life changes  Advance Directives (For Healthcare)  Does patient have an advance directive? No    Chaplain supported family as they said their goodbyes and their last wishes to Pt. The family also prayed for God to have God's way.The family wants medical aid but doesn't want their loved one to be in pain. Chaplain prayed with family and offered some biblical text.  Family wanted to use a room to discuss code status and life support. Chaplain is still with family and is providing emotional and spiritual aid. Family is very religious and some members are waiting for "God to save the Pt's life". Chaplain is working with family to help them rethink their spiritual understanding of God.

## 2014-10-24 NOTE — Progress Notes (Signed)
ANTICOAGULATION CONSULT NOTE - Follow Up Consult  Pharmacy Consult for heparin Indication: Afib, LV thrombus, RUE thrombus   Labs:  Recent Labs  10/22/14 0222 10/22/14 0500 11/07/2014 0247 10/14/2014 0530 10/27/2014 0531 11/05/2014 1205 10/17/2014 1325 10/13/2014 1615 11/07/2014 0400  HGB 12.2  --  14.9  --   --   --   --   --  11.8*  HCT 37.9  --  45.3  --   --   --   --   --  35.2*  PLT 228  --  205  --   --   --   --   --  203  APTT  --  100*  --   --   --   --   --   --   --   LABPROT  --  15.8*  --  15.3*  --   --   --   --  18.1*  INR  --  1.25  --  1.19  --   --   --   --  1.49  HEPARINUNFRC  --  0.48  --   --   --  0.47  --   --  0.48  CREATININE 1.62*  --  1.75*  --   --   --  2.06*  --   --   TROPONINI  --   --   --   --  0.43*  --  0.51* 0.43*  --      Assessment/Plan:  59yo female therapeutic on heparin after resumed s/p embolectomy; had been very stable at this rate during the admission. Will continue gtt at current rate and continue to monitor.   Vernard GamblesVeronda Donie Lemelin, PharmD, BCPS  11/11/2014,4:33 AM

## 2014-10-24 NOTE — Consult Note (Addendum)
NEURO HOSPITALIST CONSULT NOTE   Referring physician: Mclean   Reason for Consult: prognosis  HPI:                                                                                                                                          [name] [name] is an 59 y.o. female past medical history of hyperlipidemia, obesity, persistent atrial fibrillation, left atrial thrombus and rheumatic heart disease presented to outpatient TEE and noted to be in afib with RVR with hypoxia. She was admitted to hospital. She was noted to have asymmetric left leg swelling while in hospital. On the 11th patient coded and found to be in PEA. After 2 rounds of Epi ROSC occurred. On 10-12 patient underwent a right popliteal and tibial embolectomy. This AM she was found to be unresponsive with fixed pupils.  Neurology was asked to evaluate.   Past Medical History  Diagnosis Date  . Asthma   . Obesity   . Hypercholesteremia   . Heart murmur   . Atrial fibrillation with rapid ventricular response (HCC) 09/03/2014  . Bifascicular block 09/12/2013  . 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 Pure hypercholesterolemia 09/12/2013  . Severe obesity (BMI >= 40) (HCC) 09/12/2013  . 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 Chronic diastolic heart failure (HCC)   . Acute renal artery occlusion (HCC) 09/03/2014  . 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 Abdominal hysterectomy    . Knee arthroscopy    . Cardiac catheterization N/A 09/13/2014    Procedure: Right/Left Heart Cath and Coronary Angiography;  Surgeon: Corky CraftsJayadeep S Varanasi, MD;  [facility];  Service: Cardiovascular;  Laterality: N/A;  . Tee without cardioversion N/A 10/30/2014    Procedure: TRANSESOPHAGEAL ECHOCARDIOGRAM (TEE);  Surgeon: Laurey Moralealton S McLean, MD;  [facility];  Service: Cardiovascular;  Laterality: N/A;  . Embolectomy Right  06/13/2014    Procedure: Right Popliteal and tibial artery Embolectomy;  Surgeon: Chuck Hinthristopher S Dickson, MD;  [facility];  Service: Vascular;  Laterality: Right;    Family History  Problem Relation Age of Onset  . Stroke Mother   . Prostate cancer Father      Social History:  reports that she has never smoked. She has never used smokeless tobacco. She reports that she drinks alcohol. She reports that she does not use illicit drugs.  Allergies  Allergen Reactions  . Codeine Itching    MEDICATIONS:  Prior to Admission:  Prescriptions prior to admission  Medication Sig Dispense Refill Last Dose  . b complex vitamins capsule Take 1 capsule by mouth daily.   Past Week at Unknown time  . cholecalciferol (VITAMIN D) 1000 UNITS tablet Take 2,000 Units by mouth daily.    Past Month at Unknown time  . Coenzyme Q10 (COQ-10) 10 MG CAPS Take 1 capsule by mouth daily.   Past Week at Unknown time  . diltiazem (CARDIZEM) 60 MG tablet Take 1 tablet (60 mg total) by mouth every 6 (six) hours. 120 tablet 0 10/16/2014 at 1900  . feeding supplement, ENSURE ENLIVE, (ENSURE ENLIVE) LIQD Take 237 mLs by mouth 3 (three) times daily between meals. 237 mL 12 10/16/2014 at Unknown time  . furosemide (LASIX) 40 MG tablet Taking 1 1/2 tablets (60 mg) in the AM and 1 tablet ( ) in the PM 90 tablet 6 10/16/2014 at 1900  . magnesium chloride (SLOW-MAG) 64 MG TBEC SR tablet Take 1 tablet (64 mg total) by mouth 2 (two) times daily. 60 tablet 0 10/16/2014 at 1900  . metoprolol (LOPRESSOR) 50 MG tablet Take 1.5 tablets (75 mg total) by mouth 2 (two) times daily. 90 tablet 6 10/16/2014 at 1900  . mometasone-formoterol (DULERA) 100-5 MCG/ACT AERO Inhale 2 puffs into the lungs as needed for wheezing.   10/16/2014 at 1900  . morphine (MSIR) 15 MG tablet Take 1-2 tablets (15-30 mg total) by mouth every 4 (four)  hours as needed for moderate pain or severe pain. 30 tablet 0 Past Month at Unknown time  . pravastatin (PRAVACHOL) 40 MG tablet Take 40 mg by mouth daily.    10/16/2014 at 1900  . warfarin (COUMADIN) 7.5 MG tablet Take 1 tablet (7.5 mg total) by mouth daily at 6 PM. (Patient taking differently: Take 3.75-7.5 mg by mouth daily at 6 PM. Patient takes 1/2  mg every day except on Monday patient takes 1 tablet ( 7.5 mg )) 30 tablet 0 10/16/2014 at 1900  . OMEGA-3 FATTY ACIDS PO Take 1,600 mg by mouth every Monday, Wednesday, and Friday.    More than a month at Unknown time  . pantoprazole (PROTONIX) 40 MG tablet Take 1 tablet (40 mg total) by mouth daily. Take 30-60 min before first meal of the day 30 tablet 2 More than a month at Unknown time   Scheduled: . antiseptic oral rinse  15 mL Mouth Rinse QID  . antiseptic oral rinse  7 mL Mouth Rinse Q2H while awake  . B-complex with vitamin C  1 tablet Oral Daily  . chlorhexidine gluconate  15 mL Mouth/Throat BID  . cholecalciferol  2,000 Units Oral Daily  . feeding supplement (ENSURE ENLIVE)  237 mL Oral TID BM  . fentaNYL (SUBLIMAZE) injection  50 mcg Intravenous Once  . insulin aspart  2-6 Units Subcutaneous 6 times per day  . pantoprazole (PROTONIX) IV  40 mg Intravenous QHS  . piperacillin-tazobactam (ZOSYN)  IV  3.375 g Intravenous Q8H  . pravastatin  40 mg Oral Daily  . sodium chloride  3 mL Intravenous Q12H  . vancomycin  1,250 mg Intravenous Q24H     ROS:  History obtained from unobtainable from patient due to mental status  General ROS: negative for - chills, fatigue, fever, night sweats, weight gain or weight loss Psychological ROS: negative for - behavioral disorder, hallucinations, memory difficulties, mood swings or suicidal ideation Ophthalmic ROS: negative for - blurry vision, double vision, eye pain or  loss of vision ENT ROS: negative for - epistaxis, nasal discharge, oral lesions, sore throat, tinnitus or vertigo Allergy and Immunology ROS: negative for - hives or itchy/watery eyes Hematological and Lymphatic ROS: negative for - bleeding problems, bruising or swollen lymph nodes Endocrine ROS: negative for - galactorrhea, hair pattern changes, polydipsia/polyuria or temperature intolerance Respiratory ROS: negative for - cough, hemoptysis, shortness of breath or wheezing Cardiovascular ROS: negative for - chest pain, dyspnea on exertion, edema or irregular heartbeat Gastrointestinal ROS: negative for - abdominal pain, diarrhea, hematemesis, nausea/vomiting or stool incontinence Genito-Urinary ROS: negative for - dysuria, hematuria, incontinence or urinary frequency/urgency Musculoskeletal ROS: negative for - joint swelling or muscular weakness Neurological ROS: as noted in HPI Dermatological ROS: negative for rash and skin lesion changes   Blood pressure 119/66, pulse 72, temperature 97.4 F (36.3 C), temperature source Oral, resp. rate 20, height 5' (1.524 m), weight 105.9 kg (233 lb 7.5 oz), SpO2 100 %.   Neurologic Examination:                                                                                                      HEENT-  Normocephalic, no lesions, without obvious abnormality.  Normal external eye and conjunctiva.  Normal TM's bilaterally.  Normal auditory canals and external ears. Normal external nose, mucus membranes and septum.  Normal pharynx. Cardiovascular- S1, S2 normal, pulses palpable throughout   Lungs- chest clear, no wheezing, rales, normal symmetric air entry Abdomen- normal findings: bowel sounds normal Extremities- no edema Lymph-no adenopathy palpable Musculoskeletal-no joint tenderness, deformity or swelling Skin-warm and dry, no hyperpigmentation, vitiligo, or suspicious lesions  Neurological Examination Mental Status: Patient does not respond to  verbal stimuli.  Does not respond to deep sternal rub.  Does not follow commands.  No verbalizations noted.  Cranial Nerves: II: patient does not respond confrontation bilaterally, pupils right 4 mm, left 6 mm,and nonreactive bilaterally III,IV,VI: doll's response absent bilaterally. V,VII: corneal reflex absent bilaterally  VIII: patient does not respond to verbal stimuli IX,X: gag reflex absent, XI: trapezius strength unable to test bilaterally XII: tongue strength unable to test Motor: Extremities flaccid throughout.  No spontaneous movement noted.  No purposeful movements noted. Sensory: Does not respond to noxious stimuli in any extremity. Deep Tendon Reflexes:  Absent throughout. Plantars: absent bilaterally Cerebellar: Unable to perform        Lab Results: Basic Metabolic Panel:  Recent Labs Lab 10/19/14 0257  10/21/14 0607 10/22/14 0222 10/30/2014 0247 10/20/2014 0531 11/01/2014 1325 10/22/2014 1633 2014-11-05 0400  NA 140  < > 135 135 135  --  132* 135 135  K 3.6  < > 3.6 3.8 4.4  --  4.1 4.2 4.5  CL 100*  < > 95* 94* 96*  --  94*  --  94*  CO2 31  < > 32 33* 25  --  28  --  26  GLUCOSE 106*  < > 94 84 215*  --  134*  --  164*  BUN 20  < > 18 21* 25*  --  28*  --  32*  CREATININE 1.40*  < > 1.61* 1.62* 1.75*  --  2.06*  --  2.15*  CALCIUM 8.8*  < > 8.9 9.1 9.3  --  9.1  --  9.1  MG 2.0  --   --   --   --  1.9  --   --   --   PHOS  --   --   --   --   --  4.8*  --   --   --   < > = values in this interval not displayed.  Liver Function Tests:  Recent Labs Lab 11/09/2014 1415  AST 47*  ALT 46  ALKPHOS 123  BILITOT 1.1  PROT 6.9  ALBUMIN 3.2*   No results for input(s): LIPASE, AMYLASE in the last 168 hours. No results for input(s): AMMONIA in the last 168 hours.  CBC:  Recent Labs Lab 10/20/14 0305 10/21/14 0607 10/22/14 0222 11/10/2014 0247 11/06/2014 1633 11-14-14 0400  WBC 6.3 6.0 6.4 10.1  --  17.5*  HGB 12.4 12.0 12.2 14.9 14.3 11.8*  HCT 38.2  37.3 37.9 45.3 42.0 35.2*  MCV 89.0 88.6 88.1 89.7  --  84.6  PLT 228 203 228 205  --  203    Cardiac Enzymes:  Recent Labs Lab 11/01/2014 0531 10/31/2014 1325 10/30/2014 1615  TROPONINI 0.43* 0.51* 0.43*    Lipid Panel: No results for input(s): CHOL, TRIG, HDL, CHOLHDL, VLDL, LDLCALC in the last 168 hours.  CBG:  Recent Labs Lab 11/04/2014 1209 10/20/2014 1823 10/27/2014 2014 10/21/2014 2359 11/14/14 0351  GLUCAP 105* 107* 134* 150* 153*    Microbiology: Results for orders placed or performed during the hospital encounter of 10/13/2014  MRSA PCR Screening     Status: None   Collection Time: 10/16/2014  2:00 PM  Result Value Ref Range Status   MRSA by PCR NEGATIVE NEGATIVE Final    Comment:        The GeneXpert MRSA Assay (FDA approved for NASAL specimens only), is one component of a comprehensive MRSA colonization surveillance program. It is not intended to diagnose MRSA infection nor to guide or monitor treatment for MRSA infections.   Urine culture     Status: None   Collection Time: 10/27/2014  5:00 AM  Result Value Ref Range Status   Specimen Description URINE, RANDOM  Final   Special Requests NONE  Final   Culture NO GROWTH 1 DAY  Final   Report Status 2014-11-14 FINAL  Final    Coagulation Studies:  Recent Labs  10/22/14 0500 10/18/2014 0530 November 14, 2014 0400  LABPROT 15.8* 15.3* 18.1*  INR 1.25 1.19 1.49    Imaging: Dg Chest Port 1 View  14-Nov-2014  CLINICAL DATA:  Shortness of breath, check endotracheal tube placement EXAM: PORTABLE CHEST - 1 VIEW COMPARISON:  11/05/2014 FINDINGS: Endotracheal tube is again identified 2.5 cm above the carina. Cardiac shadow is stable. Diffuse increased density is noted throughout both lungs and stable. There is been reduction in the right-sided pleural effusion and a pneumothorax is now seen. Nasogastric catheter is noted within the stomach. A linear density is noted over the lying the midline and is stable. This  may be extrinsic  to the patient. A left jugular central line is noted at the cavoatrial junction. No other focal abnormality is seen. IMPRESSION: Persistent airspace disease bilaterally stable from the prior exam. Reduction in right-sided pleural effusion although a right pneumothorax is now seen. Critical Value/emergent results were called by telephone at the time of interpretation on 11/11/2014 at 8:33 am to [facility], the patients nurse, who verbally acknowledged these results. Electronically Signed   By: Alcide Clever M.D.   On: 10/31/2014 08:35   Dg Chest Port 1 View  10/20/2014  CLINICAL DATA:  Hypoxia EXAM: PORTABLE CHEST 1 VIEW COMPARISON:  October 23, 2014 FINDINGS: Endotracheal tube tip is 1.2 cm above the carina. Nasogastric tube tip and side port are in the stomach. Central catheter tip is at the cavoatrial junction. No pneumothorax. There is widespread airspace disease throughout the lungs bilaterally. Heart is mildly enlarged. The pulmonary vascularity is grossly normal. No adenopathy apparent. IMPRESSION: Tube and catheter positions as described without pneumothorax. Note that the endotracheal tube tip is close to although above the carina. Widespread airspace consolidation remains bilaterally. Suspect diffuse congestive heart failure, although widespread pneumonia or hemorrhage could present similarly under differential considerations. More than one of these entities may exist concurrently. There may also be a degree of superimposed ARDS. Electronically Signed   By: Bretta Bang III M.D.   On: 11/05/2014 06:58   Dg Chest Port 1 View  11/01/2014  CLINICAL DATA:  Code with intubation. EXAM: PORTABLE CHEST 1 VIEW COMPARISON:  10/22/2014 FINDINGS: Endotracheal tube is been placed with tip measuring about 1.9 cm above the carina. This could be retracted about 1-2 cm for better placement. Shallow inspiration. Normal heart size and pulmonary vascularity. Diffuse bilateral airspace disease with air bronchograms  demonstrated throughout both lungs with a mostly perihilar distribution. This demonstrates progression since previous study. This could represent edema, bilateral pneumonia, or hemorrhage. Probable right pleural effusion. No visible pneumothorax. IMPRESSION: Endotracheal tube tip measures 1.9 cm above the carina. Increasing diffuse bilateral parenchymal infiltration in the lungs. Probable right pleural effusion. Electronically Signed   By: Burman Nieves M.D.   On: 10/19/2014 03:58   Dg Abd Portable 1v  10/27/2014  CLINICAL DATA:  OG tube placement. EXAM: PORTABLE ABDOMEN - 1 VIEW COMPARISON:  09/03/2014 FINDINGS: Enteric tube tip is off the field of view. Tube courses through the expected location of the stomach with tip likely in or below the distal stomach. Stomach is moderately gas distended. Examination is technically limited due to motion artifact. IMPRESSION: Enteric tube tip is not visualized off the field of view but the catheter extends at least to the distal stomach. Gaseous distention of the stomach. Electronically Signed   By: Burman Nieves M.D.   On: 11/11/2014 04:51   Ct Portable Head W/o Cm  11/10/2014  CLINICAL DATA:  Altered mental status EXAM: CT HEAD WITHOUT CONTRAST TECHNIQUE: Contiguous axial images were obtained from the base of the skull through the vertex without intravenous contrast. COMPARISON:  MRI brain 09/06/2014 FINDINGS: Examination is technically limited due to patient motion, nonstandard positioning, an due to extensive streak artifact arising from dental hardware. As visualize, there is no gross evidence of acute intracranial hemorrhage or mass effect. Visualized gray-white matter junctions are distinct but can't completely exclude parenchymal disease. No ventricular dilatation. No abnormal extra-axial fluid collections. Visualized calvarium appears intact. Visualized paranasal sinuses are not opacified. Endotracheal and enteric tubes are in place. IMPRESSION:  Technically limited study. No gross evidence  of acute intracranial hemorrhage, mass effect, or ventricular dilatation. Diffuse parenchymal disease cannot be excluded due to technique. Electronically Signed   By: Burman Nieves M.D.   On: 10/21/2014 04:59       Assessment and plan per attending neurologist  Felicie Morn PA-C [facility] [phone]  10/19/2014, 9:31 AM   Assessment/Plan:  59y/o woman, hx of HLD, A fib, left atrial thrombus, rheumatic heart disease. Complicated hospital course with PEA and s/p right popliteal and tibial embolectomy. This morning found unresponsive with fixed pupils. CT head imaging reviewed with neuroradiology, shows extensive bilateral infra and supratentorial infarction, involving the large majority of the left cerebral hemisphere. Extensive brain swelling with transforaminal tonsillar herniation and fourth ventricular effacement.   Discussed exam and imaging findings with patients family along with CCM Dr Jamison Neighbor. Counseled them that this is unfortunately a devastating stroke with a very poor prognosis for any meaningful recovery. 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 Based on overall clinical picture, no further aggressive treatment warranted at this time. CCM will discuss further goals of care with the family.     This patient is critically ill and at significant risk of neurological worsening, death and care requires constant monitoring of vital signs, hemodynamics,respiratory and cardiac monitoring,review of multiple databases, neurological assessment, discussion with family, other specialists and medical decision making of high complexity. I spent 45 inutes of neurocritical care time in the care of this patient.   Elspeth Cho, DO [facility] [phone]  If 7pm- 7am, please page neurology on call as listed in AMION.

## 2014-10-24 NOTE — Progress Notes (Signed)
Pt Cardwells' daughter called [facility]. She was updated on her mothers neurological status change and all of her questions were answered. She was informed that her mother had a neurological consult that had been placed by the HF cardiology team. The daughter was also informed that her mother would need a second head CT scan.She expressed understanding. No further pt changes at this time. Will continue to monitor.

## 2014-10-24 NOTE — Progress Notes (Signed)
PULMONARY / CRITICAL CARE MEDICINE   Name: [name] [name] MRN: [id number] DOB: 02-25-1955    ADMISSION DATE:  10/15/2014 CONSULTATION DATE:  11/07/2014  REFERRING MD :  Dr. Shirlee LatchMcLean - Advanced heart failure  CHIEF COMPLAINT:  Cardiac arrest  INITIAL PRESENTATION:   STUDIES:  L/RHC > no significant coronary artery disease was found, severe pulmonary artery hypertension with cardiac output 3.5, cardiac index 1.7, pulmonary artery saturation 42%, severe mitral stenosis with valve area 0.58 cm TEE 10/5 > LVEF 50%, mild to mod AS, severe MS. RA and RV mod dilated.  CT head 10/12 >>>  SIGNIFICANT EVENTS: 10/5 admitted  10/9 diuresing well, started on sildenafil 10/10 milrinone started 10/11 early AM brief cardiac arrest, intubated, to ICU 10/12 neuro changes with grim prognosis   HISTORY OF PRESENT ILLNESS:  59 year old female with PMH as below, which is significant for chronic atrial fib, HLD, obesity, and rheumatic heart disease, from which she has known mitral stenosis, aortic stenosis and diastolic heart failure. She is followed in the cardiology clinic by Dr. Patty SermonsBrackbill. CT of abdomen and pelvis demonstrated large left atrial filling defect concerning for LA thrombus. This was subsequently confirmed on echocardiogram. She also had left renal artery occlusion seen on CT of abdomen as well which resulted in complete infarct of the left kidney and acute renal insufficiency which gradually resolved. TTE obtained on 09/04/2014 showed EF 50-55%, mild-to-moderate AS, severe MS, large thrombus in left atrium, severely reduced RVEF, moderate TR, peak PA pressure 64. It was felt that her new onset of atrial fibrillation was secondary to her severe MS. She was seen by Dr. Cornelius Moraswen and it was planned for her to undergo valve replacement surgery after being anticoagulated for at least 4-6 weeks. Prior to her discharge, she underwent a left and right heart catheterization as part of the preoperative workup,  no significant coronary artery disease was found, severe pulmonary artery hypertension with cardiac output 3.5, cardiac index 1.7, pulmonary artery saturation 42%, severe mitral stenosis with valve area 0.58 cm.   Since that discharge she has had trouble managing her volume status and lasix dosing had been changed, and she has also have variable INR levels despite reporting taking warfarin every day. She presented for TEE 10/5 and was noted to be in AF RVR. She was hypoxic with signs of volume overload. TEE showed EF 50%, intraventricular septum with D-shaped suggestive of RV pressure/volume overload, moderately dilated RV with mild to moderate systolic dysfunction, mitral valve was heavily calcified and rheumatic-appearing, severe mitral stenosis, moderate to severe tricuspid regurg, mild aortic stenosis, moderate right atrial enlargement, severe left atrial enlargement, very large left atrial thrombus. She was admitted to SDU for diuresis.    She was diuresing well on lasix and was being evaluated by CVTS. Plan was to go to OR 10/12 for mitral and aortic valve replacement using bioprosthetic tissue valves, removal of left atrial clot, tricuspid valve repair, and maze procedure. She was switched to IV amio to help maintain NSR and sildenafil and milrinone were added. She was feeling better and ambulating around the unit. She was also losing weight as volume status was much improved. 10/10 late PM she was feeling anxious. She said this was because she was having anxiety about her upcoming surgery on 10/12. She got back into bed and was ordered some ativan. Shortly after, she became became bradycardic and eventually went into PEA. A code blue was called and she suffered about 4 mins of downtime with  2 amps epi. Post ROSC she was unresponsive and was intubated by CRNA. She was moved to ICU and PCCM consulted.     SUBJECTIVE:   VITAL SIGNS: Temp:  [97.3 F (36.3 C)-97.9 F (36.6 C)] 97.4 F (36.3 C)  (10/12 0725) Pulse Rate:  [68-99] 72 (10/12 0812) Resp:  [17-21] 20 (10/12 0812) BP: (88-119)/(56-66) 119/66 mmHg (10/12 0812) SpO2:  [98 %-100 %] 100 % (10/12 0812) Arterial Line BP: (85-183)/(51-86) 114/63 mmHg (10/12 0800) FiO2 (%):  [40 %-100 %] 40 % (10/12 0812) Weight:  [233 lb 7.5 oz (105.9 kg)] 233 lb 7.5 oz (105.9 kg) (10/12 0308) HEMODYNAMICS: CVP:  [6 mmHg-15 mmHg] 10 mmHg VENTILATOR SETTINGS: Vent Mode:  [-] PRVC FiO2 (%):  [40 %-100 %] 40 % Set Rate:  [20 bmp] 20 bmp Vt Set:  [500 mL] 500 mL PEEP:  [5 cmH20-12 cmH20] 12 cmH20 Plateau Pressure:  [28 cmH20-33 cmH20] 30 cmH20 INTAKE / OUTPUT:  Intake/Output Summary (Last 24 hours) at 10/29/2014 0953 Last data filed at 10/28/2014 0831  Gross per 24 hour  Intake 2667.28 ml  Output   1676 ml  Net 991.28 ml    PHYSICAL EXAMINATION: General:  Obese female on vent Neuro:  Comatose, blown pupils, no gag HEENT:  [place]/AT, no JVD noted, PERRL Cardiovascular:  RRR, 3/6 SEM Lungs:  Coarse bilateral rhonchi Abdomen:  Soft, non-tender, non-distended Musculoskeletal:  No acute deformity. No edema Skin:  Grossly intact  LABS:  CBC  Recent Labs Lab 10/22/14 0222 2014/11/05 0247 2014/11/05 1633 10/18/2014 0400  WBC 6.4 10.1  --  17.5*  HGB 12.2 14.9 14.3 11.8*  HCT 37.9 45.3 42.0 35.2*  PLT 228 205  --  203   Coag's  Recent Labs Lab 10/22/14 0500 11-05-2014 0530 10/28/2014 0400  APTT 100*  --   --   INR 1.25 1.19 1.49   BMET  Recent Labs Lab 11-05-14 0247 Nov 05, 2014 1325 November 05, 2014 1633 11/11/2014 0400  NA 135 132* 135 135  K 4.4 4.1 4.2 4.5  CL 96* 94*  --  94*  CO2 25 28  --  26  BUN 25* 28*  --  32*  CREATININE 1.75* 2.06*  --  2.15*  GLUCOSE 215* 134*  --  164*   Electrolytes  Recent Labs Lab 10/19/14 0257  Nov 05, 2014 0247 05-Nov-2014 0531 2014-11-05 1325 11/12/2014 0400  CALCIUM 8.8*  < > 9.3  --  9.1 9.1  MG 2.0  --   --  1.9  --   --   PHOS  --   --   --  4.8*  --   --   < > = values in this interval not  displayed. Sepsis Markers  Recent Labs Lab 11-05-14 0257 2014-11-05 0530 November 05, 2014 0531 Nov 05, 2014 1325 11/01/2014 0400  LATICACIDVEN 7.9* 2.0  --  1.8  --   PROCALCITON  --   --  7.29  --  40.24   ABG  Recent Labs Lab 11/05/2014 0731 11-05-14 1633 2014/11/05 2111  PHART 7.295* 7.338* 7.372  PCO2ART 57.7* 52.6* 52.7*  PO2ART 66.0* 111.0* 343.0*   Liver Enzymes  Recent Labs Lab 10/22/2014 1415  AST 47*  ALT 46  ALKPHOS 123  BILITOT 1.1  ALBUMIN 3.2*   Cardiac Enzymes  Recent Labs Lab 2014/11/05 0531 11/05/2014 1325 November 05, 2014 1615  TROPONINI 0.43* 0.51* 0.43*   Glucose  Recent Labs Lab 11-05-14 0728 11/05/14 1209 Nov 05, 2014 1823 November 05, 2014 2014 11-05-2014 2359 10/13/2014 0351  GLUCAP 163* 105* 107* 134* 150* 153*  Imaging Dg Chest Port 1 View  10/29/2014  CLINICAL DATA:  Shortness of breath, check endotracheal tube placement EXAM: PORTABLE CHEST - 1 VIEW COMPARISON:  10/16/2014 FINDINGS: Endotracheal tube is again identified 2.5 cm above the carina. Cardiac shadow is stable. Diffuse increased density is noted throughout both lungs and stable. There is been reduction in the right-sided pleural effusion and a pneumothorax is now seen. Nasogastric catheter is noted within the stomach. A linear density is noted over the lying the midline and is stable. This may be extrinsic to the patient. A left jugular central line is noted at the cavoatrial junction. No other focal abnormality is seen. IMPRESSION: Persistent airspace disease bilaterally stable from the prior exam. Reduction in right-sided pleural effusion although a right pneumothorax is now seen. Critical Value/emergent results were called by telephone at the time of interpretation on 10/19/2014 at 8:33 am to [facility], the patients nurse, who verbally acknowledged these results. Electronically Signed   By: Alcide Clever M.D.   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 PAPs ~80. PVR 5 WU with recent RHC Pulmonary Edema RT pnx P:    Full vent support Follow CXR Follow ABG Diuresis per cardiology No CT at this time  CARDIOVASCULAR  CVL LIJ>> A:  Cardiogenic shock > improved PEA cardiac arrest Large LA thrombus Acute on chronic diastolic CHF Rheumatic heart disease. Severe MS, AS Chronic Atrial fibrillation  P:  Cardiology primary Telemetry monitoring Levophed for MAP goal 65 (currently off) Milrinone gtt Conitnue Amiodarone gtt EKG in AM Trend troponin Trend lactic acid Heparin as below Plan to OR for mitral and aortic valve replacement, removal of left atrial clot, tricuspid valve repair, and maze 10/12 Art line placed Place CVL Hold sildenafil, resume if able to come off pressors Suspect EOL discussion will make above plans mute. 10/12  RENAL A:   Acute renal failure L renal infarction in setting L atrial thrombus  P:   Follow Bmet Correct electrolytes as indicated.  Heparin as below  GASTROINTESTINAL A:   No acute issues  P:   NPO Pepcid for GI ppx  HEMATOLOGIC A:   Large L atrial thrombus ? PE Rt popliteal embolectomy 10/11  P:  Continue full dose heparin CTA chest when able V/Q very low yield Follow CBC  INFECTIOUS A:   ? Sepsis ? HCAP  P:   BCx2 10/11 >>> UC 10/11 >>> Sputum 10/11 >>> Abx: Zosyn, start date 10/11>> Abx: Vancomycin, start date 10/11>> PCT >40  ENDOCRINE A:   Hyperglycemia with no history of DM  P:   CBG monitoring and SSI  NEUROLOGIC A:   Acute encephalopathy. Likely multifactorial, hypoxic, anoxic, metabolic.   P:   See CT head FAMILY  - Updates: Family contacted by cardiology fellow 10/11 early AM  - Inter-disciplinary family meet or Palliative Care meeting due by:  10/18  Global: Early am 10/12 acute change in mental status. Pupils blown, negative gag and doll's  eyes. CT head with severe injury with very poor prognosis. CT chest + rt pnx, small. ? EOL discussion 10/12  [facility] Minor ACNP Adolph Pollack PCCM Pager [phone] till 3 pm If no answer page [phone] 11/04/2014, 9:53 AM

## 2014-10-24 NOTE — Progress Notes (Signed)
   VASCULAR SURGERY ASSESSMENT & PLAN:  * 1 Day Post-Op s/p: Right Popliteal embolectomy  *  Good doppler flow in right foot  * On heparin.   * Discontinue JP  SUBJECTIVE: Not responsive  PHYSICAL EXAM: Filed Vitals:   March 27, 2014 0400 March 27, 2014 0500 March 27, 2014 0523 March 27, 2014 0600  BP:   112/64   Pulse: 78 76 76 73  Temp: 97.6 F (36.4 C)     TempSrc: Oral     Resp: 20 20  20   Height:      Weight:      SpO2: 99% 100% 100% 100%   Good ATA signal with doppler Foot viable.  JP 20 cc total  LABS: Lab Results  Component Value Date   WBC 17.5* 07/07/2014   HGB 11.8* 07/07/2014   HCT 35.2* 07/07/2014   MCV 84.6 07/07/2014   PLT 203 07/07/2014   Lab Results  Component Value Date   CREATININE 2.15* 07/07/2014   Lab Results  Component Value Date   INR 1.49 07/07/2014   CBG (last 3)   Recent Labs  11/09/2014 2014 11/02/2014 2359 March 27, 2014 0351  GLUCAP 134* 150* 153*    Active Problems:   Dyspnea   Severe obesity (BMI >= 40) (HCC)   Mitral stenosis   Atrial fibrillation with rapid ventricular response (HCC)   Left atrial thrombus (HCC)   Mitral valve stenosis and aortic valve stenosis   Rheumatic mitral stenosis   Rheumatic aortic stenosis   Chronic diastolic heart failure (HCC)   Mitral valve stenosis   Acute on chronic diastolic heart failure (HCC)   Acute respiratory failure (HCC)   Endotracheally intubated   Cari Carawayhris Rollen Selders Beeper: [phone] 07/07/2014

## 2014-10-24 NOTE — Progress Notes (Signed)
Pt [name] [name] has had a neurological change overnight. Night nurse contacted cardiology fellow about change. HF Cardiology MD and NP at bedside and aware of patients change. Patients pupils are dilated and fixed at this time and she is not responding to painful stimuli. Patients sister has been contacted about patients change. Patients daughter and spouse were not available to talk this am. Neurology has also been contacted this am. Will continue to monitor.

## 2014-10-24 NOTE — Progress Notes (Addendum)
Pt returned to room 2H10 from transport to CT. FIO2 returned to 40%. Sat 100%. Pt's vitals remained stable throughout. RT will continue to monitor.

## 2014-10-24 NOTE — Consult Note (Signed)
Consultation Note Date: 10/20/2014   Patient Name: [name] [name]  DOB: Jun 03, 1955  MRN: [id number]  Age / Sex: 59 y.o., female   PCP: Stephens Shire, MD Referring Physician: Larey Dresser, MD  Reason for Consultation: Establishing goals of care  Palliative Care Assessment and Plan Summary of Established Goals of Care and Medical Treatment Preferences   Clinical Assessment/Narrative: [name] [name] is an 59 y.o. female past medical history of hyperlipidemia, obesity, persistent atrial fibrillation, left atrial thrombus and rheumatic heart disease presented to outpatient TEE and noted to be in afib with RVR with hypoxia. She was admitted to hospital. She was noted to have asymmetric left leg swelling while in hospital. On the 2022-03-03 patient coded and found to be in PEA. After 2 rounds of Epi ROSC occurred. She underwent a right popliteal and tibial embolectomy. On 10/12 she was found to be unresponsive with fixed pupils.  Palliative consulted for goals of care.  I met with [name] [name] family in conjunctionwith Dr. Aundra Dubin for a family meeting. This included her husband, 3 children, cousin, bedside nurse, and chaplain.  Dr. Aundra Dubin updated family on her current clinical situation.  We then talked about what was most important to [name] [name]. Her family reports her faith, her family, and enjoying time outside including gardening are the most important things to her.  We talked about her current clinical course and her family reports her doctors have been doing a good job explaining things to them. They understand she has a very grim prognosis and little chance of meaningful recovery. They report they are having a great deal of difficulty processing everything due to the sudden nature of the events and remain in a very emotional place at this time.  We discussed prior statements the patient had made regarding not being dependent on life support to keep her alive.  We discussed that in  light of her current medical condition, there is little realistic chance she will ever achieve goal of getting back to home and spending time there with family. I discussed with family regarding heroic interventions at the end-of-life and they agree this would not be in line with prior expressed wishes for a natural death or be in line with care that would lead to getting well enough to go back home.   Her family seems to be understanding her prognosis, however they remain in a very emotionally vulnerable state and are still processing her sudden decline.  They are in agreement that we should limit her care to interventions that are currently being done and if she were to suffer cardiac arrest that this would be the point where no further heroic interventions such as CPR should be attempted.  They are not wanting to withdraw any care at this time. They are in agreement with meeting again tomorrow at 9 AM to continue discussions on how best to proceed forward with caring for [name] [name].   - Family is processing her sudden decline and the fact that she will never recover. There is no plan to withdraw care at this time, and family reports they "just can't think of that right now."  - Plan to continue current care including ventilator support and pressors. If she were to suffer cardiac arrest, no plan for CPR or defibrillation. Otherwise continue current therapy at this time. - I agreed the family has had a lot of information to process today. They were in agreement with meeting tomorrow to discuss how she is doing  and continue discussion regarding development of plan moving forward. We'll plan on meeting again tomorrow at 9 AM to continue discussion.   Contacts/Participants in Discussion: Primary Decision Maker: Patient's family, including her husband, 3 adult children and cousins   HCPOA: No, patient started process of paperwork but did not complete   Code Status/Advance Care Planning:  Limited code.  Plan to continue with current therapy however if she were to suffer cardiac arrest no plan for CPR or defibrillation.  Symptom Management:  - Patient unresponsive on ventilator and pressor support.  Psycho-social/Spiritual:   Support System: Strong through family and pastoral support  Desire for further Chaplaincy support:yes- I have asked them to be part of meeting tomorrow at 9 AM  Prognosis: Hours - Days  Discharge Planning:  Continue to discuss tomorrow. Plan for this evening is to continue current therapy. No CPR or defibrillation in the event of cardiac arrest. We will reconvene tomorrow with the family at 9 AM to continue discussion.       Chief Complaint/History of Present Illness:  Patient intubated and nonresponsive  Primary Diagnoses  Present on Admission:  . Acute on chronic diastolic heart failure ([place]) . Atrial fibrillation with rapid ventricular response ([place]) . Chronic diastolic heart failure ([place]) . Dyspnea . Left atrial thrombus ([place]) . Mitral stenosis . Mitral valve stenosis and aortic valve stenosis . Mitral valve stenosis . Rheumatic aortic stenosis . Rheumatic mitral stenosis . Severe obesity (BMI >= 40) (H[facility])  Palliative Review of Systems: Patient unresponsive I have reviewed the medical record, interviewed the patient and family, and examined the patient. The following aspects are pertinent.  Past Medical History  Diagnosis Date  . Asthma   . Obesity   . Hypercholesteremia   . Heart murmur   . Atrial fibrillation with rapid ventricular response ([place]) 09/03/2014  . Bifascicular block 09/12/2013  . Left atrial thrombus ([place]) 09/03/2014  . Pure hypercholesterolemia 09/12/2013  . Severe obesity (BMI >= 40) ([place]) 09/12/2013  . 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 Chronic diastolic heart failure ([place])   . Acute renal artery occlusion (H[facility]) 09/03/2014  . 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 Marital Status: Divorced    Spouse Name: N/A  . Number of Children: 3  . Years of Education: N/A   Social History Main Topics  . Smoking status: Never Smoker   . Smokeless tobacco: Never Used  . Alcohol Use: 0.0 oz/week    0 Standard drinks or equivalent per week     Comment: Rare  . Drug Use: No  . Sexual Activity: Not Asked   Other Topics Concern  . None   Social History Narrative   Family History  Problem Relation Age of Onset  . Stroke Mother   . Prostate cancer Father    Scheduled Meds: . antiseptic oral rinse  15 mL Mouth Rinse QID  . antiseptic oral rinse  7 mL Mouth Rinse Q2H while awake  . B-complex with vitamin C  1 tablet Oral Daily  . chlorhexidine gluconate  15 mL Mouth/Throat BID  . cholecalciferol  2,000 Units Oral Daily  . feeding supplement (ENSURE ENLIVE)  237 mL Oral TID BM  . fentaNYL (SUBLIMAZE) injection  50 mcg Intravenous Once  . insulin aspart  2-6 Units Subcutaneous 6 times per day  . pantoprazole (PROTONIX) IV  40 mg Intravenous QHS  . piperacillin-tazobactam (ZOSYN)  IV  3.375 g  Intravenous Q8H  . pravastatin  40 mg Oral Daily  . sodium chloride  3 mL Intravenous Q12H  . vancomycin  1,250 mg Intravenous Q24H   Continuous Infusions: . amiodarone 30 mg/hr (10/22/2014 0449)  . amiodarone 30 mg/hr (11/08/2014 1519)  . fentaNYL infusion INTRAVENOUS Stopped (10/16/2014 0000)  . heparin Stopped (10/20/2014 1145)  . norepinephrine (LEVOPHED) Adult infusion 18 mcg/min (10/14/2014 1233)  . vasopressin (PITRESSIN) infusion - *FOR SHOCK* Stopped (10/29/2014 1000)   PRN Meds:.sodium chloride, acetaminophen, benzonatate, fentaNYL, midazolam, midazolam, mometasone-formoterol, sodium chloride Medications Prior to Admission:  Prior to Admission medications   Medication Sig Start Date End Date Taking? Authorizing Provider  b complex vitamins capsule Take 1 capsule by mouth daily.   Yes Historical Provider, MD  cholecalciferol (VITAMIN D) 1000  UNITS tablet Take 2,000 Units by mouth daily.    Yes Historical Provider, MD  Coenzyme Q10 (COQ-10) 10 MG CAPS Take 1 capsule by mouth daily.   Yes Historical Provider, MD  diltiazem (CARDIZEM) 60 MG tablet Take 1 tablet (60 mg total) by mouth every 6 (six) hours. 09/18/14  Yes Geradine Girt, DO  feeding supplement, ENSURE ENLIVE, (ENSURE ENLIVE) LIQD Take 237 mLs by mouth 3 (three) times daily between meals. 09/18/14  Yes Geradine Girt, DO  furosemide (LASIX) 40 MG tablet Taking 1 1/2 tablets (60 mg) in the AM and 1 tablet (69m) in the PM 10/02/14  Yes LBurtis Junes NP  magnesium chloride (SLOW-MAG) 64 MG TBEC SR tablet Take 1 tablet (64 mg total) by mouth 2 (two) times daily. 09/18/14  Yes JGeradine Girt DO  metoprolol (LOPRESSOR) 50 MG tablet Take 1.5 tablets (75 mg total) by mouth 2 (two) times daily. 10/15/14  Yes LBurtis Junes NP  mometasone-formoterol (DULERA) 100-5 MCG/ACT AERO Inhale 2 puffs into the lungs as needed for wheezing.   Yes Historical Provider, MD  morphine (MSIR) 15 MG tablet Take 1-2 tablets (15-30 mg total) by mouth every 4 (four) hours as needed for moderate pain or severe pain. 09/18/14  Yes JGeradine Girt DO  pravastatin (PRAVACHOL) 40 MG tablet Take 40 mg by mouth daily.  09/11/13  Yes Historical Provider, MD  warfarin (COUMADIN) 7.5 MG tablet Take 1 tablet (7.5 mg total) by mouth daily at 6 PM. Patient taking differently: Take 3.75-7.5 mg by mouth daily at 6 PM. Patient takes 1/2  mg every day except on Monday patient takes 1 tablet ( 7.5 mg ) 09/18/14  Yes JGeradine Girt DO  OMEGA-3 FATTY ACIDS PO Take 1,600 mg by mouth every Monday, Wednesday, and Friday.     Historical Provider, MD  pantoprazole (PROTONIX) 40 MG tablet Take 1 tablet (40 mg total) by mouth daily. Take 30-60 min before first meal of the day 08/28/14   MTanda Rockers MD   Allergies  Allergen Reactions  . Codeine Itching   CBC:    Component Value Date/Time   WBC 17.5* 10/28/2014 0400   HGB 11.8*  10/19/2014 0400   HCT 35.2* 11/12/2014 0400   PLT 203 10/29/2014 0400   MCV 84.6 10/21/2014 0400   NEUTROABS 9.1* 09/03/2014 1600   LYMPHSABS 1.0 09/03/2014 1600   MONOABS 0.6 09/03/2014 1600   EOSABS 0.0 09/03/2014 1600   BASOSABS 0.0 09/03/2014 1600   Comprehensive Metabolic Panel:    Component Value Date/Time   NA 135 10/29/2014 0400   K 4.5 11/02/2014 0400   CL 94* 10/22/2014 0400   CO2 26 10/26/2014 0400  BUN 32* 10/22/2014 0400   CREATININE 2.15* 11/10/2014 0400   CREATININE 1.22* 10/15/2014 1452   GLUCOSE 164* 11/10/2014 0400   CALCIUM 9.1 11/06/2014 0400   AST 47* 11/02/2014 1415   ALT 46 11/05/2014 1415   ALKPHOS 123 10/31/2014 1415   BILITOT 1.1 10/21/2014 1415   PROT 6.9 11/07/2014 1415   ALBUMIN 3.2* 10/14/2014 1415    Physical Exam: Vital Signs: BP 100/67 mmHg  Pulse 122  Temp(Src) 96.9 F (36.1 C) (Axillary)  Resp 20  Ht 5' (1.524 m)  Wt 105.9 kg (233 lb 7.5 oz)  BMI 45.60 kg/m2  SpO2 97% SpO2: SpO2: 97 % O2 Device: O2 Device: Ventilator O2 Flow Rate: O2 Flow Rate (L/min): 4 L/min Intake/output summary:  Intake/Output Summary (Last 24 hours) at 11/03/2014 1433 Last data filed at 10/19/2014 1233  Gross per 24 hour  Intake 2310.09 ml  Output   2300 ml  Net  10.09 ml   LBM: Last BM Date: 10/22/14 Baseline Weight: Weight: 109.317 kg (241 lb) Most recent weight: Weight: 105.9 kg (233 lb 7.5 oz)  Exam Findings:  General: Obese, intubated and on vent, nonresponsive Neuro: Comatose, no pupillary reflex, no gag HEENT: [place]/AT, no JVD noted, PERRL Cardiovascular: RRR, 3/6 SEM Lungs: Coarse bilateral rhonchi Abdomen: Soft, non-tender, non-distended Musculoskeletal: No acute deformity. No edema Skin: No rash or lesions on limited exam         Palliative Performance Scale: 10               Additional Data Reviewed: Recent Labs     10/18/2014  0247  10/28/2014  1325  10/16/2014  1633  10/22/2014  0400  WBC  10.1   --    --   17.5*  HGB  14.9   --    14.3  11.8*  PLT  205   --    --   203  NA  135  132*  135  135  BUN  25*  28*   --   32*  CREATININE  1.75*  2.06*   --   2.15*     Time In: 1345 Time Out: 1530 Time Total: 90 Greater than 50%  of this time was spent counseling and coordinating care related to the above assessment and plan.  Signed by: Micheline Rough, MD  Micheline Rough, MD  10/14/2014, 2:33 PM  Please contact Palliative Medicine Team phone at [phone] for questions and concerns.

## 2014-10-25 DIAGNOSIS — Z515 Encounter for palliative care: Secondary | ICD-10-CM | POA: Insufficient documentation

## 2014-10-25 DIAGNOSIS — I63133 Cerebral infarction due to embolism of bilateral carotid arteries: Secondary | ICD-10-CM

## 2014-10-25 DIAGNOSIS — J9600 Acute respiratory failure, unspecified whether with hypoxia or hypercapnia: Secondary | ICD-10-CM

## 2014-10-25 DIAGNOSIS — G9382 Brain death: Secondary | ICD-10-CM | POA: Insufficient documentation

## 2014-10-25 DIAGNOSIS — Z7189 Other specified counseling: Secondary | ICD-10-CM | POA: Insufficient documentation

## 2014-10-25 DIAGNOSIS — I743 Embolism and thrombosis of arteries of the lower extremities: Secondary | ICD-10-CM | POA: Insufficient documentation

## 2014-10-25 DIAGNOSIS — I469 Cardiac arrest, cause unspecified: Secondary | ICD-10-CM | POA: Insufficient documentation

## 2014-10-25 LAB — BLOOD GAS, ARTERIAL
ACID-BASE EXCESS: 2.1 mmol/L — AB (ref 0.0–2.0)
Bicarbonate: 26.4 mEq/L — ABNORMAL HIGH (ref 20.0–24.0)
DRAWN BY: 105521
FIO2: 1
MECHVT: 500 mL
O2 SAT: 99.5 %
PATIENT TEMPERATURE: 98.6
PCO2 ART: 43.1 mmHg (ref 35.0–45.0)
PEEP: 5 cmH2O
PH ART: 7.404 (ref 7.350–7.450)
PO2 ART: 258 mmHg — AB (ref 80.0–100.0)
RATE: 14 resp/min
TCO2: 27.7 mmol/L (ref 0–100)

## 2014-10-25 LAB — BASIC METABOLIC PANEL
Anion gap: 16 — ABNORMAL HIGH (ref 5–15)
BUN: 34 mg/dL — ABNORMAL HIGH (ref 6–20)
CALCIUM: 9.5 mg/dL (ref 8.9–10.3)
CO2: 27 mmol/L (ref 22–32)
CREATININE: 2.46 mg/dL — AB (ref 0.44–1.00)
Chloride: 102 mmol/L (ref 101–111)
GFR calc Af Amer: 24 mL/min — ABNORMAL LOW (ref >60)
GFR calc non Af Amer: 20 mL/min — ABNORMAL LOW (ref >60)
GLUCOSE: 128 mg/dL — AB (ref 65–99)
Potassium: 3.7 mmol/L (ref 3.5–5.1)
Sodium: 145 mmol/L (ref 135–145)

## 2014-10-25 LAB — CBC
HCT: 32.9 % — ABNORMAL LOW (ref 36.0–46.0)
Hemoglobin: 11.1 g/dL — ABNORMAL LOW (ref 12.0–15.0)
MCH: 27.9 pg (ref 26.0–34.0)
MCHC: 33.7 g/dL (ref 30.0–36.0)
MCV: 82.7 fL (ref 78.0–100.0)
PLATELETS: 168 10*3/uL (ref 150–400)
RBC: 3.98 MIL/uL (ref 3.87–5.11)
RDW: 16.8 % — AB (ref 11.5–15.5)
WBC: 10.1 10*3/uL (ref 4.0–10.5)

## 2014-10-25 LAB — GLUCOSE, CAPILLARY
GLUCOSE-CAPILLARY: 123 mg/dL — AB (ref 65–99)
Glucose-Capillary: 123 mg/dL — ABNORMAL HIGH (ref 65–99)

## 2014-10-25 LAB — CARBOXYHEMOGLOBIN
Carboxyhemoglobin: 1.8 % — ABNORMAL HIGH (ref 0.5–1.5)
Methemoglobin: 0.7 % (ref 0.0–1.5)
O2 Saturation: 81.1 %
Total hemoglobin: 11.3 g/dL — ABNORMAL LOW (ref 12.0–16.0)

## 2014-10-25 LAB — PROCALCITONIN: Procalcitonin: 34.25 ng/mL

## 2014-10-25 LAB — PROTIME-INR
INR: 1.38 (ref 0.00–1.49)
PROTHROMBIN TIME: 17.1 s — AB (ref 11.6–15.2)

## 2014-10-25 LAB — HEPARIN LEVEL (UNFRACTIONATED): Heparin Unfractionated: <0.1 IU/mL — ABNORMAL LOW (ref 0.30–0.70)

## 2014-10-25 MED ORDER — VASOPRESSIN 20 UNIT/ML IV SOLN
0.0300 [IU]/min | INTRAVENOUS | Status: DC
  Administered 2014-10-25: 0.03 [IU]/min via INTRAVENOUS

## 2014-10-25 MED ORDER — GLYCOPYRROLATE 0.2 MG/ML IJ SOLN
0.2000 mg | Freq: Once | INTRAMUSCULAR | Status: DC
  Filled 2014-10-25: qty 1

## 2014-10-25 MED ORDER — VANCOMYCIN HCL IN DEXTROSE 1-5 GM/200ML-% IV SOLN: 1000.0000 mg | INTRAVENOUS | Status: DC

## 2014-10-26 ENCOUNTER — Ambulatory Visit: Payer: Self-pay | Admitting: Cardiology

## 2014-10-26 DIAGNOSIS — I513 Intracardiac thrombosis, not elsewhere classified: Secondary | ICD-10-CM

## 2014-10-26 DIAGNOSIS — I4891 Unspecified atrial fibrillation: Secondary | ICD-10-CM

## 2014-10-28 LAB — CULTURE, BLOOD (ROUTINE X 2)
CULTURE: NO GROWTH
Culture: NO GROWTH

## 2014-10-29 ENCOUNTER — Ambulatory Visit: Payer: Commercial Managed Care - HMO | Admitting: Thoracic Surgery (Cardiothoracic Vascular Surgery)

## 2014-11-12 ENCOUNTER — Telehealth: Payer: Self-pay | Admitting: Neurology

## 2014-11-12 ENCOUNTER — Telehealth: Payer: Self-pay | Admitting: *Deleted

## 2014-11-12 NOTE — Telephone Encounter (Signed)
Rn call patients daughter back Elease Hashimotoatricia about the [employer]. Rn stated patient was never seen in our office and died in the hospital. Rn stated the manager will have to be advised of this issue.

## 2014-11-12 NOTE — Telephone Encounter (Signed)
Rn receive incoming call from Patriica pts daughter. Rn explain to the daughter that patient was not seen outpatient by Dr. Roda ShuttersXu or Dr.Sethi. Pt was in the hospital in August discharge end of September 2016. Dr. Roda ShuttersXu saw patient on 10/13/2014 when pt was readmitted. Rn explain that the form has been filled out with ICD codes and diagnose. PTs daughter stated that Dr. Crista Curborinna Sullivan Md filled out the diagnose codes, and other parts of the form. Rn gave patient number to the neurology clinic at [phone] to get in contact with the doctor. Rn explain that the manager stated we are not allowed to filled the form out because she was never seen outpatient. Rn will leave the form for patients daughter to pick up. Dr Nani Skillernorrina Sullivan saw the patients mom in September and August 2016.

## 2014-11-12 NOTE — Telephone Encounter (Signed)
Form,[employer] received from Arman BogusDana J Sent to Katrina and Dr Roda ShuttersXu 11/12/14.

## 2014-11-12 NOTE — Telephone Encounter (Signed)
Rn talk to Scientist, water qualityJanet manager of [employer]. She stated the form will have to be done at the hospital. Pt was seen inpatient by Dr. Roda ShuttersXu not outpatient. Rn will call patients daughter back.

## 2014-11-12 NOTE — Telephone Encounter (Signed)
Patient's daughter Rodell Pernaatrice states she is here and has a form that needs to be signed entitled "Attending Physician" form for for [employer] insurance.  She is leaving this at the front desk.  Please call her when ready for pickup.  Thanks!

## 2014-11-12 NOTE — Telephone Encounter (Signed)
Rn receive incoming call from [name] [name] pts daughter. Rn explain to the daughter that patient was not seen outpatient by Dr. Xu or Dr.Sethi. Pt was in the hospital in August discharge end of September 2016. Dr. Xu saw patient on 10/21/2014 when pt was readmitted. Rn explain that the form has been filled out with ICD codes and diagnose. PTs daughter stated that Dr. [name] filled out the diagnose codes, and other parts of the form. Rn gave patient number to the neurology clinic at [phone] to get in contact with the doctor. Rn explain that the manager stated we are not allowed to filled the form out because she was never seen outpatient. Rn will leave the form for patients daughter to pick up. Dr [name] saw the patients mom in September and August 2016. 

## 2014-11-13 NOTE — Progress Notes (Signed)
I was present for extubation of [name] [name].    Following extubation, no spontaneous respirations.  Progressing bradycardia noted followed by cessation of heart function on monitor.  Patient lying in bed.  No pupillary response.  No response to nailbed pressure and no palpable central or peripheral pulses.  Listened in conjunction with bedside nurse for greater than one minute with no auscultable cardiac or respiratory activity.    Time of death 18:56.  [name] MinusGene Najib Colmenares, MD [facility] Pall[facility] Team [phone]

## 2014-11-13 NOTE — Progress Notes (Signed)
Nutrition Brief Note  Chart reviewed. 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  No further nutrition interventions warranted at this time.  Please re-consult as needed.   Kendell BaneHeather Marlean Mortell RD, LDN, CNSC [phone] Pager [phone] After Hours Pager

## 2014-11-13 NOTE — Progress Notes (Signed)
PULMONARY / CRITICAL CARE MEDICINE   Name: Tammy Soursamela M Leachman MRN: [id number] DOB: 1955-03-02    ADMISSION DATE:  11/10/2014 CONSULTATION DATE:  10/21/2014  REFERRING MD :  Dr. Shirlee LatchMcLean - Advanced heart failure  CHIEF COMPLAINT:  Cardiac arrest  INITIAL PRESENTATION:   STUDIES:  L/RHC > no significant coronary artery disease was found, severe pulmonary artery hypertension with cardiac output 3.5, cardiac index 1.7, pulmonary artery saturation 42%, severe mitral stenosis with valve area 0.58 cm TEE 10/5 > LVEF 50%, mild to mod AS, severe MS. RA and RV mod dilated.  CT head 10/12 >>>  SIGNIFICANT EVENTS: 10/5 admitted  10/9 diuresing well, started on sildenafil 10/10 milrinone started 10/11 early AM brief cardiac arrest, intubated, to ICU 10/12 neuro changes with grim prognosis 10/12- ct head herniation   HISTORY OF PRESENT ILLNESS:  59 year old female with PMH as below, which is significant for chronic atrial fib, HLD, obesity, and rheumatic heart disease, from which she has known mitral stenosis, aortic stenosis and diastolic heart failure. She is followed in the cardiology clinic by Dr. Patty SermonsBrackbill. CT of abdomen and pelvis demonstrated large left atrial filling defect concerning for LA thrombus. This was subsequently confirmed on echocardiogram. She also had left renal artery occlusion seen on CT of abdomen as well which resulted in complete infarct of the left kidney and acute renal insufficiency which gradually resolved. TTE obtained on 09/04/2014 showed EF 50-55%, mild-to-moderate AS, severe MS, large thrombus in left atrium, severely reduced RVEF, moderate TR, peak PA pressure 64. It was felt that her new onset of atrial fibrillation was secondary to her severe MS. She was seen by Dr. Cornelius Moraswen and it was planned for her to undergo valve replacement surgery after being anticoagulated for at least 4-6 weeks. Prior to her discharge, she underwent a left and right heart catheterization as part of  the preoperative workup, no significant coronary artery disease was found, severe pulmonary artery hypertension with cardiac output 3.5, cardiac index 1.7, pulmonary artery saturation 42%, severe mitral stenosis with valve area 0.58 cm.   Since that discharge she has had trouble managing her volume status and lasix dosing had been changed, and she has also have variable INR levels despite reporting taking warfarin every day. She presented for TEE 10/5 and was noted to be in AF RVR. She was hypoxic with signs of volume overload. TEE showed EF 50%, intraventricular septum with D-shaped suggestive of RV pressure/volume overload, moderately dilated RV with mild to moderate systolic dysfunction, mitral valve was heavily calcified and rheumatic-appearing, severe mitral stenosis, moderate to severe tricuspid regurg, mild aortic stenosis, moderate right atrial enlargement, severe left atrial enlargement, very large left atrial thrombus. She was admitted to SDU for diuresis.    She was diuresing well on lasix and was being evaluated by CVTS. Plan was to go to OR 10/12 for mitral and aortic valve replacement using bioprosthetic tissue valves, removal of left atrial clot, tricuspid valve repair, and maze procedure. She was switched to IV amio to help maintain NSR and sildenafil and milrinone were added. She was feeling better and ambulating around the unit. She was also losing weight as volume status was much improved. 10/10 late PM she was feeling anxious. She said this was because she was having anxiety about her upcoming surgery on 10/12. She got back into bed and was ordered some ativan. Shortly after, she became became bradycardic and eventually went into PEA. A code blue was called and she suffered about 4  mins of downtime with 2 amps epi. Post ROSC she was unresponsive and was intubated by CRNA. She was moved to ICU and PCCM consulted.     SUBJECTIVE: neuro exam remains poor  VITAL SIGNS: Temp:  [96.6 F  (35.9 C)-98.3 F (36.8 C)] 98 F (36.7 C) (10/13 0400) Pulse Rate:  [28-122] 84 (10/13 0500) Resp:  [18-23] 18 (10/13 0356) BP: (100-119)/(56-67) 111/56 mmHg (10/13 0356) SpO2:  [97 %-100 %] 99 % (10/13 0500) Arterial Line BP: (87-116)/(55-69) 110/56 mmHg (10/13 0500) FiO2 (%):  [40 %-100 %] 40 % (10/13 0356) Weight:  [103.1 kg (227 lb 4.7 oz)] 103.1 kg (227 lb 4.7 oz) (10/13 0226) HEMODYNAMICS: CVP:  [8 mmHg-10 mmHg] 8 mmHg VENTILATOR SETTINGS: Vent Mode:  [-] PRVC FiO2 (%):  [40 %-100 %] 40 % Set Rate:  [18 bmp-20 bmp] 18 bmp Vt Set:  [500 mL] 500 mL PEEP:  [10 cmH20-12 cmH20] 10 cmH20 Plateau Pressure:  [22 cmH20-30 cmH20] 28 cmH20 INTAKE / OUTPUT:  Intake/Output Summary (Last 24 hours) at 11-06-2014 0758 Last data filed at 11/06/14 0600  Gross per 24 hour  Intake 1249.14 ml  Output   3430 ml  Net -2180.86 ml    PHYSICAL EXAMINATION: General:  Obese female on vent Neuro:  Comatose, blown pupils, no gag, no cough, no movement to pain on any ext, no dolls, no corneal's - BRAIN DEAD on examination HEENT:  /AT, no JVD noted, PERRL Cardiovascular:  RRR, 3/6 SEM Lungs: coarse reduced rt Abdomen:  Soft, non-tender, non-distended Musculoskeletal:  No acute deformity. No edema Skin:  Grossly intact  LABS:  CBC  Recent Labs Lab 10/16/2014 0247 11/06/2014 1633 10/21/2014 0400 2014/11/06 0355  WBC 10.1  --  17.5* 10.1  HGB 14.9 14.3 11.8* 11.1*  HCT 45.3 42.0 35.2* 32.9*  PLT 205  --  203 168   Coag's  Recent Labs Lab 10/22/14 0500 10/26/2014 0530 10/31/2014 0400 Nov 06, 2014 0355  APTT 100*  --   --   --   INR 1.25 1.19 1.49 1.38   BMET  Recent Labs Lab 10/18/2014 1325 10/30/2014 1633 10/29/2014 0400 2014-11-06 0355  NA 132* 135 135 145  K 4.1 4.2 4.5 3.7  CL 94*  --  94* 102  CO2 28  --  26 27  BUN 28*  --  32* 34*  CREATININE 2.06*  --  2.15* 2.46*  GLUCOSE 134*  --  164* 128*   Electrolytes  Recent Labs Lab 10/19/14 0257  11/02/2014 0531 11/07/2014 1325  11/06/2014 0400 11-06-2014 0355  CALCIUM 8.8*  < >  --  9.1 9.1 9.5  MG 2.0  --  1.9  --   --   --   PHOS  --   --  4.8*  --   --   --   < > = values in this interval not displayed. Sepsis Markers  Recent Labs Lab 11/11/2014 0257 10/22/2014 0530 10/13/2014 0531 10/31/2014 1325 10/19/2014 0400 11-06-14 0355  LATICACIDVEN 7.9* 2.0  --  1.8  --   --   PROCALCITON  --   --  7.29  --  40.24 34.25   ABG  Recent Labs Lab 10/19/2014 1633 10/13/2014 2111 10/22/2014 2048  PHART 7.338* 7.372 7.494*  PCO2ART 52.6* 52.7* 38.5  PO2ART 111.0* 343.0* 161.0*   Liver Enzymes No results for input(s): AST, ALT, ALKPHOS, BILITOT, ALBUMIN in the last 168 hours. Cardiac Enzymes  Recent Labs Lab 10/21/2014 0531 11/01/2014 1325 10/18/2014 1615  TROPONINI 0.43* 0.51*  0.43*   Glucose  Recent Labs Lab 10/30/2014 0757 10/16/2014 1225 10/20/2014 1641 10/29/2014 1942 10/13/2014 2337 11/04/2014 0332  GLUCAP 122* 128* 112* 114* 123* 123*    Imaging Ct Head Wo Contrast  10/28/2014  CLINICAL DATA:  Fixed pupils EXAM: CT HEAD WITHOUT CONTRAST TECHNIQUE: Contiguous axial images were obtained from the base of the skull through the vertex without intravenous contrast. COMPARISON:  Head CT from yesterday FINDINGS: Skull and Sinuses:No acute osseous finding. Layering fluid in the nasopharynx and mastoid air cells/ middle ears related to intubation. Orbits: No acute abnormality. Brain: Extensive cytotoxic edema, involving essentially the entire left cerebral hemisphere with high-density thrombus throughout the left MCA branches. There is multi focal cytotoxic edema in the right cerebral hemisphere. The bilateral lower and right upper cerebellum is diffusely edematous compatible with infarct. Majority of high density is pseudosubarachnoid sign or thrombosed vessel (left MCA), but there is trace intraventricular hemorrhage in the lower fourth ventricle. Diffuse sulcal effacement, rightward midline shift of 9 mm, and transforaminal  tonsillar herniation. Critical Value/emergent results were called by telephone at the time of interpretation on 10/22/2014 at 10:06 am to Dr. Dr Hosie Poisson , who reviewed the case with me in person. Pending communication with ordering team. IMPRESSION: 1. Extensive bilateral infra and supratentorial infarction, involving the large majority of the left cerebral hemisphere. Extensive brain swelling with transforaminal tonsillar herniation and fourth ventricular effacement. 2. Trace subarachnoid hemorrhage at the lower fourth ventricle. Electronically Signed   By: Marnee Spring M.D.   On: 10/19/2014 10:13   Ct Chest Wo Contrast  10/20/2014  CLINICAL DATA:  Spontaneous right-sided pneumothorax. Chronic diastolic heart failure. Renal infarction. Obesity. EXAM: CT CHEST WITHOUT CONTRAST TECHNIQUE: Multidetector CT imaging of the chest was performed following the standard protocol without IV contrast. COMPARISON:  Multiple chest radiographs, most recent earlier today. No prior chest CT. An abdominal pelvic CT of 09/03/2014 is reviewed. FINDINGS: Mediastinum/Nodes: Nasogastric tube which is followed to the inferior aspect of the study. A left-sided central line terminates at the superior caval/ atrial junction. Mild cardiomegaly. Aortic and mitral valvular calcifications. Right coronary artery atherosclerosis. No pericardial effusion. Limited evaluation for thoracic adenopathy secondary to above factors, as well as lack of IV contrast. Lungs/Pleura: Small right greater than left pleural effusions. Mild to moderate degradation, secondary to patient size, arm position, wire and lead artifacts. Small right-sided pneumothorax, visceral pleural line maximally 1.7 cm from chest wall. Mild motion degradation as well. Endotracheal tube terminates 1.7 cm above the carina. Bibasilar airspace disease. Upper abdomen: Grossly normal imaged portions of the liver, spleen, stomach, pancreas, adrenal glands, kidneys. Musculoskeletal: No  acute osseous abnormality. Convex right thoracic spine curvature. IMPRESSION: 1. Multifactorial degradation, moderate in severity. 2. Right-sided hydropneumothorax, with small volume right pleural air component, on the order of 10-15%. 3. Small left pleural effusion with bibasilar atelectasis or infection. 4. Cardiomegaly with age advanced coronary artery atherosclerosis. Aortic and mitral valvular calcifications suggest valvular disease. Electronically Signed   By: Jeronimo Greaves M.D.   On: 11/01/2014 09:57   Dg Chest Port 1 View  11/08/2014  CLINICAL DATA:  Shortness of breath, check endotracheal tube placement EXAM: PORTABLE CHEST - 1 VIEW COMPARISON:  10/22/2014 FINDINGS: Endotracheal tube is again identified 2.5 cm above the carina. Cardiac shadow is stable. Diffuse increased density is noted throughout both lungs and stable. There is been reduction in the right-sided pleural effusion and a pneumothorax is now seen. Nasogastric catheter is noted within the stomach. A linear density  is noted over the lying the midline and is stable. This may be extrinsic to the patient. A left jugular central line is noted at the cavoatrial junction. No other focal abnormality is seen. IMPRESSION: Persistent airspace disease bilaterally stable from the prior exam. Reduction in right-sided pleural effusion although a right pneumothorax is now seen. Critical Value/emergent results were called by telephone at the time of interpretation on 11/03/2014 at 8:33 am to [facility], the patients nurse, who verbally acknowledged these results. Electronically Signed   By: Alcide Clever M.D.   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 PAPs ~80. PVR 5 WU with recent RHC Pulmonary Edema RT hydro PTX P:   Will apnea Reduce peep 5, abg baseline, then for apnea pre oxygenate Last abg  reviewed, reduce rate 14 then abg  CARDIOVASCULAR  CVL [facility]>> A:  Cardiogenic shock > improved PEA cardiac arrest Large LA thrombus Acute on chronic diastolic CHF Rheumatic heart disease. Severe MS, AS Chronic Atrial fibrillation NOW brain dead on exam and NO HR variability  P:  Cardiology primary Telemetry monitoring Levophed for MAP Conitnue Amiodarone gtt  RENAL A:   Acute renal failure L renal infarction in setting L atrial thrombus Brain edema P:   Avoid free water Chem in am unless declared  GASTROINTESTINAL A:   No acute issues  P:   NPO keep Pepcid for GI ppx  HEMATOLOGIC A:   Large L atrial thrombus Rt popliteal embolectomy 10/11  P:  Follow CBC  INFECTIOUS A:   ? Sepsis ? HCAP  P:   BCx2 10/11 >>> UC 10/11 >>> Sputum 10/11 >>> Abx: Zosyn, start date 10/11>> Abx: Vancomycin, start date 10/11>> PCT >40>34   ENDOCRINE A:   Hyperglycemia with no history of DM  P:   CBG monitoring and SSI  NEUROLOGIC BRAIN DEAD on examination Herniated CVA  P:   No longer need palliative care consult Pt is brain dead on exam and will d/w family and perform apnea testing Remains off sedation Ensure donor involved  Ccm time 53   Ivor Kishi J. Tyson Alias, MD, FACP Pgr: [phone] [facility]

## 2014-11-13 NOTE — Progress Notes (Signed)
VASCULAR SURGERY  Plans for withdrawing support noted. Vascular will be available as needed.   [name] Ferrarihristopher Airika Alkhatib, MD, FACS Beeper [phone] Office: [phone]

## 2014-11-13 NOTE — Progress Notes (Signed)
Patient ID: [name] [name], female   DOB: 1955/04/27, 59 y.o.   MRN: [id number]   SUBJECTIVE:  10/11 cardiac arrest, PEA. Required 2 epinephrine and CPR for 7 min. CT of head negative. Intubated. Lost pulse in RLE. Vascular consulted for popliteal thrombus. S/P successful thrombectomy.  Yesterday  not responding to painful stimuli.. CT of head with massive CVA.  Pupils fixed on exam. No change over night.   CT of head- CT head imaging reviewed with neuroradiology, shows extensive bilateral infra and supratentorial infarction, involving the large majority of the left cerebral hemisphere. Extensive brain swelling with transforaminal tonsillar herniation and fourth ventricular effacement.   Limited echo 11/01/2014 EF 65-70% L atrial thrombus looks the same and not obstructing the mitral valve, similar severe MV stenosis, dilated and dysfunctional RV.   Scheduled Meds: . antiseptic oral rinse  15 mL Mouth Rinse QID  . antiseptic oral rinse  7 mL Mouth Rinse Q2H while awake  . B-complex with vitamin C  1 tablet Oral Daily  . chlorhexidine gluconate  15 mL Mouth/Throat BID  . cholecalciferol  2,000 Units Oral Daily  . feeding supplement (ENSURE ENLIVE)  237 mL Oral TID BM  . fentaNYL (SUBLIMAZE) injection  50 mcg Intravenous Once  . insulin aspart  2-6 Units Subcutaneous 6 times per day  . pantoprazole (PROTONIX) IV  40 mg Intravenous QHS  . piperacillin-tazobactam (ZOSYN)  IV  3.375 g Intravenous Q8H  . pravastatin  40 mg Oral Daily  . sodium chloride  3 mL Intravenous Q12H  . [START ON 10/26/2014] vancomycin  1,000 mg Intravenous Q24H   Continuous Infusions: . amiodarone 30 mg/hr (11/11/2014 1519)  . fentaNYL infusion INTRAVENOUS Stopped (Nov 13, 2014 0000)  . norepinephrine (LEVOPHED) Adult infusion 22 mcg/min (10/15/2014 0423)  . vasopressin (PITRESSIN) infusion - *FOR SHOCK* Stopped (13-Nov-2014 1000)   PRN Meds:.sodium chloride, acetaminophen, benzonatate, fentaNYL, midazolam, midazolam,  mometasone-formoterol, polyvinyl alcohol, sodium chloride   Filed Vitals:   11/04/2014 0300 11/03/2014 0356 11/05/2014 0400 10/28/2014 0500  BP:  111/56    Pulse: 84 83 84 84  Temp:   98 F (36.7 C)   TempSrc:   Oral   Resp:  18    Height:      Weight:      SpO2: 99% 99% 99% 99%    Intake/Output Summary (Last 24 hours) at 11/01/2014 0712 Last data filed at 10/17/2014 0600  Gross per 24 hour  Intake 1249.14 ml  Output   3430 ml  Net -2180.86 ml    LABS: Basic Metabolic Panel:  Recent Labs  40/98/11 0531  11/13/14 0400 10/16/2014 0355  NA  --   < > 135 145  K  --   < > 4.5 3.7  CL  --   < > 94* 102  CO2  --   < > 26 27  GLUCOSE  --   < > 164* 128*  BUN  --   < > 32* 34*  CREATININE  --   < > 2.15* 2.46*  CALCIUM  --   < > 9.1 9.5  MG 1.9  --   --   --   PHOS 4.8*  --   --   --   < > = values in this interval not displayed. Liver Function Tests: No results for input(s): AST, ALT, ALKPHOS, BILITOT, PROT, ALBUMIN in the last 72 hours. No results for input(s): LIPASE, AMYLASE in the last 72 hours. CBC:  Recent Labs  Nov 13, 2014 0400 10/13/2014  0355  WBC 17.5* 10.1  HGB 11.8* 11.1*  HCT 35.2* 32.9*  MCV 84.6 82.7  PLT 203 168   Cardiac Enzymes:  Recent Labs  10/14/2014 0531 11/01/2014 1325 11/01/2014 1615  TROPONINI 0.43* 0.51* 0.43*   BNP: Invalid input(s): POCBNP D-Dimer: No results for input(s): DDIMER in the last 72 hours. Hemoglobin A1C: No results for input(s): HGBA1C in the last 72 hours. Fasting Lipid Panel: No results for input(s): CHOL, HDL, LDLCALC, TRIG, CHOLHDL, LDLDIRECT in the last 72 hours. Thyroid Function Tests: No results for input(s): TSH, T4TOTAL, T3FREE, THYROIDAB in the last 72 hours.  Invalid input(s): FREET3 Anemia Panel: No results for input(s): VITAMINB12, FOLATE, FERRITIN, TIBC, IRON, RETICCTPCT in the last 72 hours.  RADIOLOGY: Dg Chest 2 View  10/22/2014  CLINICAL DATA:  History of CHF. History of elevated white blood cell count.  EXAM: CHEST  2 VIEW COMPARISON:  None. FINDINGS: Cardiomegaly with mild bilateral from interstitial prominence. Congestive heart failure cannot be excluded. Pneumonitis cannot be excluded. Right base subsegmental atelectasis. No pneumothorax. IMPRESSION: 1. Cardiomegaly with mild by pulmonary interstitial prominence suggesting mild congestive heart failure. Interstitial pneumonitis cannot be excluded. 2. Right base subsegmental atelectasis. Electronically Signed   By: Maisie Fus  Register   On: 10/22/2014 07:21   Ct Head Wo Contrast  10/27/2014  CLINICAL DATA:  Fixed pupils EXAM: CT HEAD WITHOUT CONTRAST TECHNIQUE: Contiguous axial images were obtained from the base of the skull through the vertex without intravenous contrast. COMPARISON:  Head CT from yesterday FINDINGS: Skull and Sinuses:No acute osseous finding. Layering fluid in the nasopharynx and mastoid air cells/ middle ears related to intubation. Orbits: No acute abnormality. Brain: Extensive cytotoxic edema, involving essentially the entire left cerebral hemisphere with high-density thrombus throughout the left MCA branches. There is multi focal cytotoxic edema in the right cerebral hemisphere. The bilateral lower and right upper cerebellum is diffusely edematous compatible with infarct. Majority of high density is pseudosubarachnoid sign or thrombosed vessel (left MCA), but there is trace intraventricular hemorrhage in the lower fourth ventricle. Diffuse sulcal effacement, rightward midline shift of 9 mm, and transforaminal tonsillar herniation. Critical Value/emergent results were called by telephone at the time of interpretation on 10/29/2014 at 10:06 am to Dr. Dr Hosie Poisson , who reviewed the case with me in person. Pending communication with ordering team. IMPRESSION: 1. Extensive bilateral infra and supratentorial infarction, involving the large majority of the left cerebral hemisphere. Extensive brain swelling with transforaminal tonsillar herniation  and fourth ventricular effacement. 2. Trace subarachnoid hemorrhage at the lower fourth ventricle. Electronically Signed   By: Marnee Spring M.D.   On: 10/20/2014 10:13   Ct Chest Wo Contrast  10/26/2014  CLINICAL DATA:  Spontaneous right-sided pneumothorax. Chronic diastolic heart failure. Renal infarction. Obesity. EXAM: CT CHEST WITHOUT CONTRAST TECHNIQUE: Multidetector CT imaging of the chest was performed following the standard protocol without IV contrast. COMPARISON:  Multiple chest radiographs, most recent earlier today. No prior chest CT. An abdominal pelvic CT of 09/03/2014 is reviewed. FINDINGS: Mediastinum/Nodes: Nasogastric tube which is followed to the inferior aspect of the study. A left-sided central line terminates at the superior caval/ atrial junction. Mild cardiomegaly. Aortic and mitral valvular calcifications. Right coronary artery atherosclerosis. No pericardial effusion. Limited evaluation for thoracic adenopathy secondary to above factors, as well as lack of IV contrast. Lungs/Pleura: Small right greater than left pleural effusions. Mild to moderate degradation, secondary to patient size, arm position, wire and lead artifacts. Small right-sided pneumothorax, visceral pleural line maximally 1.7 cm  from chest wall. Mild motion degradation as well. Endotracheal tube terminates 1.7 cm above the carina. Bibasilar airspace disease. Upper abdomen: Grossly normal imaged portions of the liver, spleen, stomach, pancreas, adrenal glands, kidneys. Musculoskeletal: No acute osseous abnormality. Convex right thoracic spine curvature. IMPRESSION: 1. Multifactorial degradation, moderate in severity. 2. Right-sided hydropneumothorax, with small volume right pleural air component, on the order of 10-15%. 3. Small left pleural effusion with bibasilar atelectasis or infection. 4. Cardiomegaly with age advanced coronary artery atherosclerosis. Aortic and mitral valvular calcifications suggest valvular  disease. Electronically Signed   By: Jeronimo Greaves M.D.   On: 11/10/2014 09:57   Dg Chest Port 1 View  10/27/2014  CLINICAL DATA:  Shortness of breath, check endotracheal tube placement EXAM: PORTABLE CHEST - 1 VIEW COMPARISON:  10/31/2014 FINDINGS: Endotracheal tube is again identified 2.5 cm above the carina. Cardiac shadow is stable. Diffuse increased density is noted throughout both lungs and stable. There is been reduction in the right-sided pleural effusion and a pneumothorax is now seen. Nasogastric catheter is noted within the stomach. A linear density is noted over the lying the midline and is stable. This may be extrinsic to the patient. A left jugular central line is noted at the cavoatrial junction. No other focal abnormality is seen. IMPRESSION: Persistent airspace disease bilaterally stable from the prior exam. Reduction in right-sided pleural effusion although a right pneumothorax is now seen. Critical Value/emergent results were called by telephone at the time of interpretation on 10/23/2014 at 8:33 am to [facility], the patients nurse, who verbally acknowledged these results. Electronically Signed   By: Alcide Clever M.D.   On: 10/24/2014 08:35   Dg Chest Port 1 View  11/02/2014  CLINICAL DATA:  Hypoxia EXAM: PORTABLE CHEST 1 VIEW COMPARISON:  October 23, 2014 FINDINGS: Endotracheal tube tip is 1.2 cm above the carina. Nasogastric tube tip and side port are in the stomach. Central catheter tip is at the cavoatrial junction. No pneumothorax. There is widespread airspace disease throughout the lungs bilaterally. Heart is mildly enlarged. The pulmonary vascularity is grossly normal. No adenopathy apparent. IMPRESSION: Tube and catheter positions as described without pneumothorax. Note that the endotracheal tube tip is close to although above the carina. Widespread airspace consolidation remains bilaterally. Suspect diffuse congestive heart failure, although widespread pneumonia or hemorrhage could  present similarly under differential considerations. More than one of these entities may exist concurrently. There may also be a degree of superimposed ARDS. Electronically Signed   By: Bretta Bang III M.D.   On: 10/20/2014 06:58   Dg Chest Port 1 View  11/12/2014  CLINICAL DATA:  Code with intubation. EXAM: PORTABLE CHEST 1 VIEW COMPARISON:  10/22/2014 FINDINGS: Endotracheal tube is been placed with tip measuring about 1.9 cm above the carina. This could be retracted about 1-2 cm for better placement. Shallow inspiration. Normal heart size and pulmonary vascularity. Diffuse bilateral airspace disease with air bronchograms demonstrated throughout both lungs with a mostly perihilar distribution. This demonstrates progression since previous study. This could represent edema, bilateral pneumonia, or hemorrhage. Probable right pleural effusion. No visible pneumothorax. IMPRESSION: Endotracheal tube tip measures 1.9 cm above the carina. Increasing diffuse bilateral parenchymal infiltration in the lungs. Probable right pleural effusion. Electronically Signed   By: Burman Nieves M.D.   On: 10/28/2014 03:58   Dg Abd Portable 1v  11/07/2014  CLINICAL DATA:  OG tube placement. EXAM: PORTABLE ABDOMEN - 1 VIEW COMPARISON:  09/03/2014 FINDINGS: Enteric tube tip is off the field of view.  Tube courses through the expected location of the stomach with tip likely in or below the distal stomach. Stomach is moderately gas distended. Examination is technically limited due to motion artifact. IMPRESSION: Enteric tube tip is not visualized off the field of view but the catheter extends at least to the distal stomach. Gaseous distention of the stomach. Electronically Signed   By: Burman Nieves M.D.   On: 10/19/2014 04:51   Ct Portable Head W/o Cm  10/14/2014  CLINICAL DATA:  Altered mental status EXAM: CT HEAD WITHOUT CONTRAST TECHNIQUE: Contiguous axial images were obtained from the base of the skull through the  vertex without intravenous contrast. COMPARISON:  MRI brain 09/06/2014 FINDINGS: Examination is technically limited due to patient motion, nonstandard positioning, an due to extensive streak artifact arising from dental hardware. As visualize, there is no gross evidence of acute intracranial hemorrhage or mass effect. Visualized gray-white matter junctions are distinct but can't completely exclude parenchymal disease. No ventricular dilatation. No abnormal extra-axial fluid collections. Visualized calvarium appears intact. Visualized paranasal sinuses are not opacified. Endotracheal and enteric tubes are in place. IMPRESSION: Technically limited study. No gross evidence of acute intracranial hemorrhage, mass effect, or ventricular dilatation. Diffuse parenchymal disease cannot be excluded due to technique. Electronically Signed   By: Burman Nieves M.D.   On: 11/01/2014 04:59    PHYSICAL EXAM CVP 8 General: Intubated Neck: JVP difficult (intubated), no thyromegaly or thyroid nodule noted [facility]  Lungs: CTA, Normal effort. CV: Nondisplaced PMI.  Heart regular S1/S2, no S3/S4, 3/6 SEM RUSB, 1/6 diastolic murmur.  No edema  Abdomen: Obese. soft, NT, ND, no HSM Neurologic:Intubated. Not responding to painful stimuli. Pupils fixed/dilated.  Psych: On vent  Extremities: No clubbing or cyanosis. R and LLE + pulses  TELEMETRY: Sinus Rhythm 80s.   ASSESSMENT AND PLAN: 59 yo with severe rheumatic mitral stenosis, pulmonary hypertension, RV failure, atrial fibrillation, and very large LA thrombus was admitted with atrial fibrillation/RVR and acute/chronic diastolic CHF.   1. Acute on chronic diastolic CHF: LV EF 50% with D-shaped IV septum and moderately dilated RV with moderate to severe systolic dysfunction. She has NYHA class IIIb-IV symptoms at home.  Patient was severely volume overloaded in the setting of severe mitral stenosis with pulmonary hypertension and RV failure.She had an episode Monday night  consistent with flash pulmonary edema. - Off revatio with hypotension.  - Now on norepi 22 mcg.  CVP 8   - CO-OX 81.  -2. Atrial fibrillation: Large LA thrombus, does not appear to have improved with several weeks of anticoagulation (though she has been subtherapeutic at times). Spontaneously converted to NSR 10/20/14 and was started on amio. Today in Afib. Off heprain due to trace hemorrhage along with ischemic CVA on head CT.  - Continue IV amiodarone. - Had planned for surgery with MVR/AVR/Maze + LA clot removal at surgery) but cancelled due to CVA.   3. Mitral stenosis: Severe by TEE, likely rheumatic.She additionally has mild-moderate aortic stenosis and will need bioprosthetic AVR.Discussed with Dr Cornelius Moras => very large LA thrombus appears unlikely to resolve any time soon with anticoagulation. Planned valve surgery eventually along with thrombus retrieval and Maze but this is on hold.She has had cath recently showing no CAD.  Limited echo done to make sure thrombus had not migrated to obstruct mitral valve => no apparent change.  4. 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  Creatinine trending up 2.1>2.46 . 5. Pulmonary hypertension: Mixed pulmonary venous  and pulmonary arterial HTN. PAPs ~80. PVR 5 WU with recent RHC. Suspect component of PAH from reactive pulmonary vascular changes in face of long-standing MS.  - Off milrinone and sildenafil due to hypotension.  6. Aortic stenosis: Mild to moderate on echo.  7. PEA: 10/11. S/P CPR requiring epinephrine. 7 min CPR  8. Acute Respiratory Failure: Intubated 10/11.   9. ID: Procalcitonin elevated, covering with empiric antibiotics.  10. CVA: CT of head imaging reviewed with neuroradiology, shows extensive bilateral infra and supratentorial infarction, involving the large majority of the left cerebral hemisphere. Extensive brain swelling with transforaminal tonsillar herniation and fourth ventricular effacement.  Family aware and considering withdrawing. Neuro following.   11. R Popliteal Thrombus: Cardioembolic.  S/P Embolectomy 10/11.  12. PTX on right: Conservative treatment.   Family meeting today. Palliative Care following.   Amy Clegg NP-C  10/28/2014 7:12 AM  Advanced Heart Failure Team Pager [phone] (M-F; 7a - 4p)  Please contact [facility] for night-coverage after hours (4p -7a ) and weekends on [email or web address]  Patient seen with NP, agree with the above note. Very unfortunate situation.  She has had a large CVA with herniation, cardioembolic.  Vitals stable today.  Very poor prognosis at this point.  Neurology, cardiology, palliative care, and critical care have had extensive discussions with family yesterday.  She is now DNR.  I do not think that there is anything further we can do to improve her situation.  Family not here yet but plan to meet with palliative care later this morning to decide further course.  I think withdrawal of care would be reasonable at this point.   Marca AnconaDalton Keilee Denman 11/07/2014 7:43 AM

## 2014-11-13 NOTE — Progress Notes (Signed)
Daily Progress Note   Patient Name: [name] [name]       Date: 01-Nov-2014 DOB: [birth year]  Age: 59 y.o. MRN#: [id number] Attending Physician: Larey Dresser, MD Primary Care Physician: Stephens Shire, MD Admit Date: 10/14/2014  Reason for Consultation/Follow-up: Establishing goals of care  Subjective: [name] [name] is an 59 y.o. female past medical history of hyperlipidemia, obesity, persistent atrial fibrillation, left atrial thrombus and rheumatic heart disease presented to outpatient TEE and noted to be in afib with RVR with hypoxia. She was admitted to hospital. She was noted to have asymmetric left leg swelling while in hospital. On the 2022-03-01 patient coded and found to be in PEA. After 2 rounds of Epi ROSC occurred. She underwent a right popliteal and tibial embolectomy. On 10/12 she was found to be unresponsive with fixed pupils. Palliative consulted for goals of care.  Interval Events: I met again with [name] [name] family. This included her husband, 2 daughters, and her son. We discussed conversation the family had with Dr. Titus Mould this morning about the fact that [name] [name] is brain dead on clinical examination and options moving forward would be to withdraw care and focus on her comfort or perform apnea testing to confirm brain death prior to withdrawal of care.  Her daughter reports that her mother had stated that she did not want to be kept alive by machines if she were ever in a situation such as when she is currently in. We talked about performing apnea test versus focusing on comfort and withdrawing care and family is in agreement that [name] [name]'s wishes would be to focus on her comfort and withdrawal ventilatory support without going through apnea testing.  They continue to be appropriately grieving due to her sudden decline. I spoke with family and they would like this morning and afternoon in order to spend time as a family and allow any other people that want to  come and visit with her to do so prior to withdrawal of care.  I called spoke with Dr. Titus Mould who is in agreement the best path moving forward would be to withdraw care later this afternoon or evening.  I spoke with the family and they will be in communication with her bedside nurse regarding timing of withdrawal.  Please let us know if we can be of further assistance in the care of [name] [name].  Length of Stay: 8 days  Current Medications: Scheduled Meds:  . antiseptic oral rinse  15 mL Mouth Rinse QID  . antiseptic oral rinse  7 mL Mouth Rinse Q2H while awake  . B-complex with vitamin C  1 tablet Oral Daily  . chlorhexidine gluconate  15 mL Mouth/Throat BID  . cholecalciferol  2,000 Units Oral Daily  . feeding supplement (ENSURE ENLIVE)  237 mL Oral TID BM  . fentaNYL (SUBLIMAZE) injection  50 mcg Intravenous Once  . insulin aspart  2-6 Units Subcutaneous 6 times per day  . pantoprazole (PROTONIX) IV  40 mg Intravenous QHS  . piperacillin-tazobactam (ZOSYN)  IV  3.375 g Intravenous Q8H  . pravastatin  40 mg Oral Daily  . sodium chloride  3 mL Intravenous Q12H  . [START ON 10/26/2014] vancomycin  1,000 mg Intravenous Q24H    Continuous Infusions: . amiodarone 30 mg/hr (11/03/2014 1519)  . fentaNYL infusion INTRAVENOUS Stopped (11/07/2014 0000)  . norepinephrine (LEVOPHED) Adult infusion 22 mcg/min (11-01-14 0423)  . vasopressin (PITRESSIN) infusion - *FOR SHOCK* Stopped (10/29/2014 1000)    PRN Meds:  sodium chloride, acetaminophen, benzonatate, fentaNYL, midazolam, midazolam, mometasone-formoterol, polyvinyl alcohol, sodium chloride  Palliative Performance Scale: 10%     Vital Signs: BP 111/56 mmHg  Pulse 86  Temp(Src) 97.6 F (36.4 C) (Oral)  Resp 18  Ht 5' (1.524 m)  Wt 103.1 kg (227 lb 4.7 oz)  BMI 44.39 kg/m2  SpO2 98% SpO2: SpO2: 98 % O2 Device: O2 Device: Ventilator O2 Flow Rate: O2 Flow Rate (L/min): 4 L/min  Intake/output summary:  Intake/Output Summary  (Last 24 hours) at November 19, 2014 1129 Last data filed at November 19, 2014 0600  Gross per 24 hour  Intake 1057.89 ml  Output   3055 ml  Net -1997.11 ml   LBM:   Baseline Weight: Weight: 109.317 kg (241 lb) Most recent weight: Weight: 103.1 kg (227 lb 4.7 oz)  Physical Exam: General:intubated and on vent, nonresponsive Neuro: Comatose, no pupillary reflex, no gag HEENT: [place]/AT, no JVD noted, PERRL Cardiovascular: RRR, 3/6 SEM Lungs: Coarse bilateral rhonchi Abdomen: Soft, non-tender, non-distended Musculoskeletal: No acute deformity. No edema Skin: No rash or lesions on limited exam           Additional Data Reviewed: Recent Labs     11/06/2014  0400  11-19-2014  0355  WBC  17.5*  10.1  HGB  11.8*  11.1*  PLT  203  168  NA  135  145  BUN  32*  34*  CREATININE  2.15*  2.46*     Problem List:  Patient Active Problem List   Diagnosis Date Noted  . Acute respiratory failure ([place]) 10/16/2014  . Endotracheally intubated   . Acute on chronic diastolic heart failure ([place]) 10/29/2014  . Mitral valve stenosis   . Renal insufficiency   . Chronic diastolic heart failure ([place])   . Acute cystitis without hematuria   . Lower extremity edema   . Mitral valve stenosis and aortic valve stenosis   . Atrial fibrillation with rapid ventricular response ([place]) 09/03/2014  . Renal infarction ([place]) 09/03/2014  . Hyperkalemia 09/03/2014  . Left atrial thrombus ([place]) 09/03/2014  . Acute renal artery occlusion ([facility]) 09/03/2014  . 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 Upper airway cough syndrome 08/28/2014  . Mitral stenosis 10/04/2013  . 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 Heart murmur, systolic 00/92/3300  . Pure hypercholesterolemia 09/12/2013  . Severe obesity (BMI >= 40) ([place]) 09/12/2013  . Bifascicular block 09/12/2013  . Dyspnea 11/03/2012     Palliative Care Assessment & Plan    Code Status:  Limited code.  Plan  for withdrawal of care this afternoon.  Goals of Care: Met with family this morning to discuss current clinical situation and options moving forward including withdrawal of care with focus on comfort versus performing apnea testing. Family is in agreement that the patient would want to focus on comfort and withdraw care without performing formal apnea testing. Plan for withdrawal of care this afternoon or evening.  Symptom Management:  Patient without any meaningful responses. Prior to extubation I would recommend pretreating with opioid as well as anxiolytic. Would then continue as needed dosing of these medications to ensure her comfort. Dr. Titus Mould will place orders for extubation.  Psycho-social/Spiritual:  Desire for further Chaplaincy support:yes   Prognosis: Withdrawal of care later today Discharge Planning: Withdrawal care later today   Care plan was discussed with Dr. Titus Mould and patient family.  Thank you for allowing the Palliative Medicine Team to assist in the care of  this patient.   Time In: 0900 Time Out: 0955 Total Time 55 Prolonged Time Billed no    Greater than 50%  of this time was spent counseling and coordinating care related to the above assessment and plan.   Micheline Rough, MD  12-Nov-2014, 11:29 AM  Please contact Palliative Medicine Team phone at [phone] for questions and concerns.

## 2014-11-13 NOTE — Progress Notes (Signed)
STROKE TEAM PROGRESS NOTE   SUBJECTIVE (INTERVAL HISTORY) No family is at the bedside.  Pt is brain dead on neuro exam.    OBJECTIVE Temp:  [96.6 F (35.9 C)-98.3 F (36.8 C)] 97.6 F (36.4 C) (10/13 0806) Pulse Rate:  [77-122] 86 (10/13 0821) Cardiac Rhythm:  [-] Normal sinus rhythm (10/13 0400) Resp:  [18-23] 18 (10/13 0821) BP: (100-118)/(56-67) 111/56 mmHg (10/13 0356) SpO2:  [97 %-100 %] 98 % (10/13 0821) Arterial Line BP: (94-116)/(55-69) 110/56 mmHg (10/13 0500) FiO2 (%):  [40 %-100 %] 100 % (10/13 0844) Weight:  [227 lb 4.7 oz (103.1 kg)] 227 lb 4.7 oz (103.1 kg) (10/13 0226)   Recent Labs Lab 10/28/2014 1641 10/22/2014 1942 10/20/2014 2337 11/09/2014 0332 10/22/2014 0805  GLUCAP 112* 114* 123* 123* 123*    Recent Labs Lab 10/19/14 0257  10/22/14 0222 05-16-2014 0247 05-16-2014 0531 05-16-2014 1325 05-16-2014 1633 10/22/2014 0400 10/15/2014 0355  NA 140  < > 135 135  --  132* 135 135 145  K 3.6  < > 3.8 4.4  --  4.1 4.2 4.5 3.7  CL 100*  < > 94* 96*  --  94*  --  94* 102  CO2 31  < > 33* 25  --  28  --  26 27  GLUCOSE 106*  < > 84 215*  --  134*  --  164* 128*  BUN 20  < > 21* 25*  --  28*  --  32* 34*  CREATININE 1.40*  < > 1.62* 1.75*  --  2.06*  --  2.15* 2.46*  CALCIUM 8.8*  < > 9.1 9.3  --  9.1  --  9.1 9.5  MG 2.0  --   --   --  1.9  --   --   --   --   PHOS  --   --   --   --  4.8*  --   --   --   --   < > = values in this interval not displayed. No results for input(s): AST, ALT, ALKPHOS, BILITOT, PROT, ALBUMIN in the last 168 hours.  Recent Labs Lab 10/21/14 0607 10/22/14 0222 05-16-2014 0247 05-16-2014 1633 10/15/2014 0400 10/18/2014 0355  WBC 6.0 6.4 10.1  --  17.5* 10.1  HGB 12.0 12.2 14.9 14.3 11.8* 11.1*  HCT 37.3 37.9 45.3 42.0 35.2* 32.9*  MCV 88.6 88.1 89.7  --  84.6 82.7  PLT 203 228 205  --  203 168    Recent Labs Lab 05-16-2014 0531 05-16-2014 1325 05-16-2014 1615  TROPONINI 0.43* 0.51* 0.43*    Recent Labs  05-16-2014 0530 10/31/2014 0400  10/21/2014 0355  LABPROT 15.3* 18.1* 17.1*  INR 1.19 1.49 1.38    Recent Labs  05-16-2014 0500  COLORURINE YELLOW  LABSPEC 1.015  PHURINE 7.0  GLUCOSEU NEGATIVE  HGBUR MODERATE*  BILIRUBINUR NEGATIVE  KETONESUR NEGATIVE  PROTEINUR 30*  UROBILINOGEN 0.2  NITRITE NEGATIVE  LEUKOCYTESUR SMALL*    No results found for: CHOL, TRIG, HDL, CHOLHDL, VLDL, LDLCALC No results found for: HGBA1C No results found for: LABOPIA, COCAINSCRNUR, LABBENZ, AMPHETMU, THCU, LABBARB  No results for input(s): ETH in the last 168 hours.  I have personally reviewed the radiological images below and agree with the radiology interpretations.  Dg Chest 2 View  10/22/2014   IMPRESSION: 1. Cardiomegaly with mild by pulmonary interstitial prominence suggesting mild congestive heart failure. Interstitial pneumonitis cannot be excluded. 2. Right base subsegmental atelectasis.   Ct  Head Wo Contrast  10/15/2014  IMPRESSION: 1. Extensive bilateral infra and supratentorial infarction, involving the large majority of the left cerebral hemisphere. Extensive brain swelling with transforaminal tonsillar herniation and fourth ventricular effacement. 2. Trace subarachnoid hemorrhage at the lower fourth ventricle.   Ct Portable Head W/o Cm  2014/11/19  IMPRESSION: Technically limited study. No gross evidence of acute intracranial hemorrhage, mass effect, or ventricular dilatation. Diffuse parenchymal disease cannot be excluded due to technique.    PHYSICAL EXAM  Temp:  [96.6 F (35.9 C)-98.3 F (36.8 C)] 97.6 F (36.4 C) (10/13 0806) Pulse Rate:  [77-122] 86 (10/13 0821) Resp:  [18-23] 18 (10/13 0821) BP: (100-118)/(56-67) 111/56 mmHg (10/13 0356) SpO2:  [97 %-100 %] 98 % (10/13 0821) Arterial Line BP: (94-116)/(55-69) 110/56 mmHg (10/13 0500) FiO2 (%):  [40 %-100 %] 100 % (10/13 0844) Weight:  [227 lb 4.7 oz (103.1 kg)] 227 lb 4.7 oz (103.1 kg) (10/13 0226)  General - on ventilation and intubated, not  breathing over the vent.  Ophthalmologic - not able to see due to ET tube.  Cardiovascular - Regular rate and rhythm.   Neuro - intubated, not breathing over the vent, left pupil 6mm, right pupil 4mm, not reactive to light. No doll's eyes, no corneal or gag or cough reflex. Cold cloric testing negative for any nystagmus. Painful stimulation did not elicit any movement. Exam consistent with clinical brain death.    ASSESSMENT/PLAN [name] is a 59 y.o. female with hx of HLD, A fib, left atrial thrombus, rheumatic heart disease. Complicated hospital course with PEA and s/p right popliteal and tibial embolectomy. This morning found unresponsive with fixed pupils. CT head imaging reviewed with neuroradiology, shows extensive bilateral infra and supratentorial infarction, involving the large majority of the left cerebral hemisphere. Extensive brain swelling with transforaminal tonsillar herniation and fourth ventricular effacement. Palliative care involved yesterday and this morning neuro exam consistent with brain death. Recommend proceed with apnea test. Discussed with Dr. Tyson Alias.  Marvel Plan, MD PhD Stroke Neurology 10/20/2014 11:24 AM    To contact Stroke Continuity provider, please refer to [email or web address]. After hours, contact General Neurology

## 2014-11-13 NOTE — Progress Notes (Signed)
   November 03, 2014 1420  Clinical Encounter Type  Visited With Family  Visit Type Follow-up  Referral From Chaplain   Chaplain, who is on-call tonight, met with family to offer support and introduce himself. Chaplain support available as needed.   Jeri Lager, Chaplain 2014/11/03 2:22 PM

## 2014-11-13 NOTE — Progress Notes (Signed)
CDS called 1930. Pt time of death reported. Pt is a candidate for tissue and eye donation. Eye prep reported to night nurse.

## 2014-11-13 NOTE — Progress Notes (Signed)
   11/12/2014 1100  Clinical Encounter Type  Visited With Patient and family together;Health care provider  Visit Type Follow-up  Referral From Nurse  Spiritual Encounters  Spiritual Needs Prayer;Emotional;Grief support  Stress Factors  Patient Stress Factors Major life changes  Chaplain visited with Pt and visit and healthcare provider; Family was given more info; Chaplain sat in with family; Chaplain will return later when more family members arrive

## 2014-11-13 NOTE — Progress Notes (Signed)
150 ml fentanyl wasted in sink with Leda GauzeMariah Deshone Lyssy RN

## 2014-11-13 DEATH — deceased

## 2014-11-28 ENCOUNTER — Telehealth: Payer: Self-pay | Admitting: Cardiology

## 2014-11-28 NOTE — Telephone Encounter (Signed)
Physicians Statement with Death Certificate was sent to our copy service. I have sent Joni ReiningNicole with [employer]  A Staff message to follow up on the form.

## 2014-11-28 NOTE — Telephone Encounter (Signed)
New message      Calling to see if Dr Shirlee LatchMcLean has completed the attending physician statement daughter faxed.  She also faxed the death certificate.  She needs the form completed for insurance purposes.  Please call and let her know the status

## 2014-12-05 ENCOUNTER — Other Ambulatory Visit: Payer: Commercial Managed Care - HMO

## 2014-12-05 ENCOUNTER — Ambulatory Visit: Payer: Commercial Managed Care - HMO | Admitting: Cardiology

## 2014-12-13 NOTE — Discharge Summary (Addendum)
Advanced Heart Failure Team  Discharge Summary   Patient ID: [name] [name] MRN: [id number], DOB/AGE: 1955/11/13 59 y.o. Admit date: 11/07/2014 D/C date:     11/06/14    Primary Discharge Diagnoses:  1. A/C Diastolic Heart Failure 2. A fib  3. LA thrombus 4. Mitral Stenosis 5. AKI  On CKD Stage IV 6. Pulmonary HTN;  7. Aortic Stenosis 8. PEA Nov 04, 2014  9. Acute Respiratory Failure 2014/11/04  10. 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 R popliteal Thrombus- S/P embolectomy 11/04/2014  12. Spontaneous Pneumothrorax on Right   59 yo with severe rheumatic mitral stenosis, pulmonary hypertension, RV failure, atrial fibrillation, and very large LA thrombus was admitted with atrial fibrillation/RVR and acute/chronic diastolic CHF. Admitted with A fib RVR, hypoxia and volume overload. Hospital course was complicated by PEA arrest followed by intubation, thrombus and herniated CVA.   1. Acute/ Chronic diastolic CHF: LV EF 50% with D-shaped IV septum and moderately dilated RV with moderate to severe systolic dysfunction. Admitted with volume overload. Severe volume overloaded in the setting of severe mitral stenosis with pulmonary hypertension and RV failure. Diuresed with IV lasix. Due to hypotension placed on norepi with a peak of 22 mcg.    --2. Atrial fibrillation: Large LA thrombus, does not appear to have improved with several weeks of anticoagulation (though she has been subtherapeutic at times). Spontaneously converted to NSR 10/20/14 and was started on amio but went back into A fib. Heparin was stopped due to trace hemorrhage along with ischemic CVA on head CT 11/04/2014.  - Had planned for surgery with MVR/AVR/Maze + LA clot removal at surgery but cancelled due to CVA.  3. Mitral stenosis: Severe by TEE, likely rheumatic.She additionally has mild-moderate aortic stenosis and will need bioprosthetic AVR. Limited echo done to make sure thrombus had not migrated to obstruct mitral valve =>  no apparent change.  Cardiac Surgery consulted. Discussed with Dr Cornelius Moras => very large LA thrombus appears unlikely to resolve any time soon with anticoagulation. Planned for valve surgery along with thrombus retrival and Maze but this was  cancelled due to PEA with cardioembolic CVA on 10/12.  4. 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 Creatinine trended up to peak 2.46. 5. 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 PAPs ~80. PVR 5 WU with recent RHC. Suspect component of PAH from reactive pulmonary vascular changes in face of long-standing MS. Initially on milrinone +revatio but this was later stopped due to hypotension.  6. Aortic stenosis: Mild to moderate on echo.  7. PEA: 10/11. S/P CPR requiring epinephrine. 7 min CPR  8. Acute Respiratory Failure: Intubated 11-04-14 after PEA. CCM consulted. Terminal extubation November 06, 2014.    9. ID: Sepsis versus HCAP. Blood and urine cultures obtained 11-04-2022. Procalcitonin elevated 7.29>40>34, covered with empiric antibiotics.  10. CVA-Left MCA - On 10/12 pupils fixed. CT of head imaging reviewed with neuroradiology, shows extensive bilateral infra and supratentorial infarction, involving the large majority of the left cerebral hemisphere. Extensive brain swelling with transforaminal tonsillar herniation and fourth ventricular effacement.Neurology consulted and unfortunately brain dead.   11. R Popliteal Thrombus: Cardioembolic. On 04-Nov-2022 after PEA no pulses noted RLE. Ultrasound was completed and showed R popliteal thrombus. Vascular surgery consulted. She was taken to the OR on Nov 04, 2022 for embolectomy R  Popliteal.   12. Spontaneous Pneumothorax on right: managed conservatively.   13. RBBB - on EKG 11-04-14   Due to brain death  as noted by neurology on 10/12, a family meeting occurred with Dr Tyson AliasFeinstein, Palliative Care, and Dr Shirlee LatchMcLean. At the conclusion of the meeting the family  requested withdrawal of care with comfort care. On October 13th, she was extubated with no spontaneous respirations. She passed with family at the bed side 18:56.      Duration of Discharge Encounter: Greater than 35 minutes   Signed, Amy Clegg NP-C 11/18/2014, 2:52 PM

## 2016-11-27 IMAGING — CR DG ABDOMEN ACUTE W/ 1V CHEST
4 series · 4 of 4 positions shown · non-contrast
Comparison: 08/30/2014 and 05/19/2014

CLINICAL DATA: Congestion for 2 weeks. History of heart murmur.
Vomiting.

EXAM:
DG ABDOMEN ACUTE W/ 1V CHEST

[w chest pa]
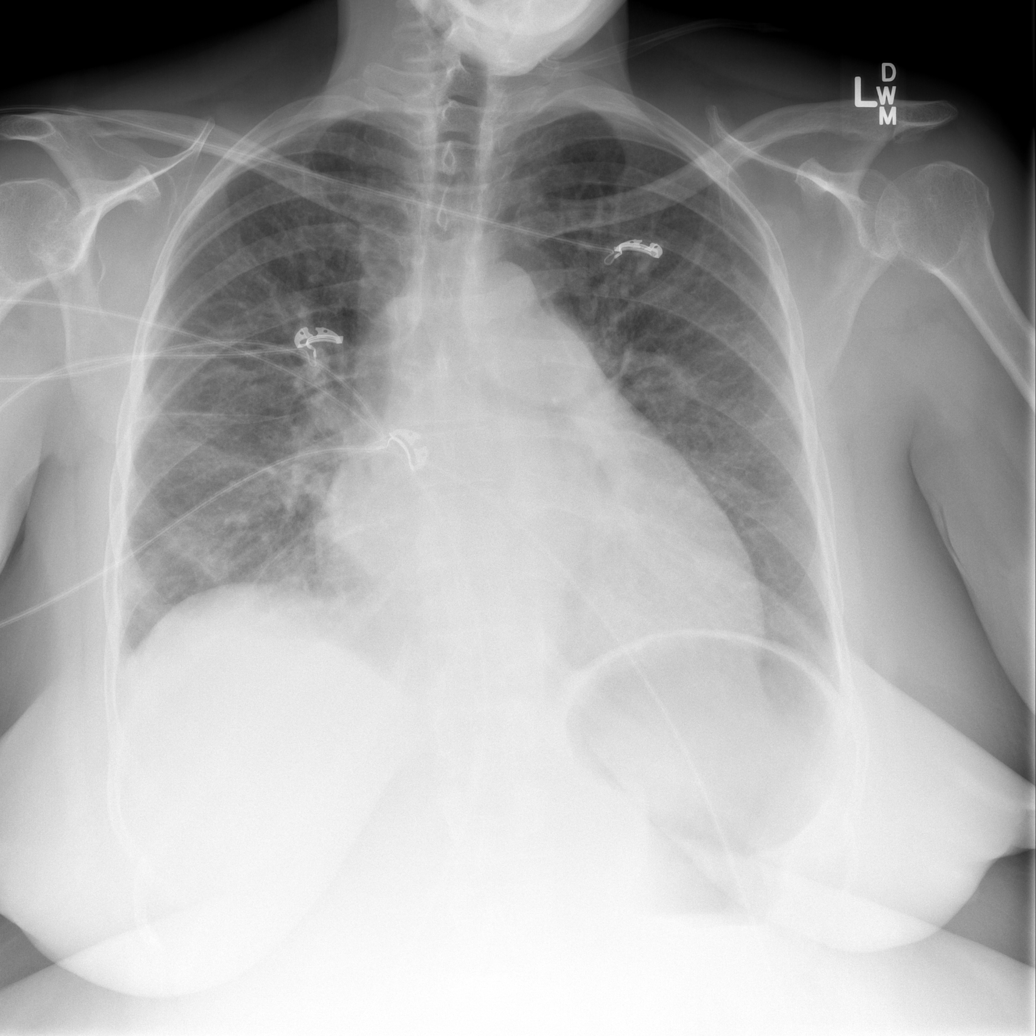

[w abdomen upright]
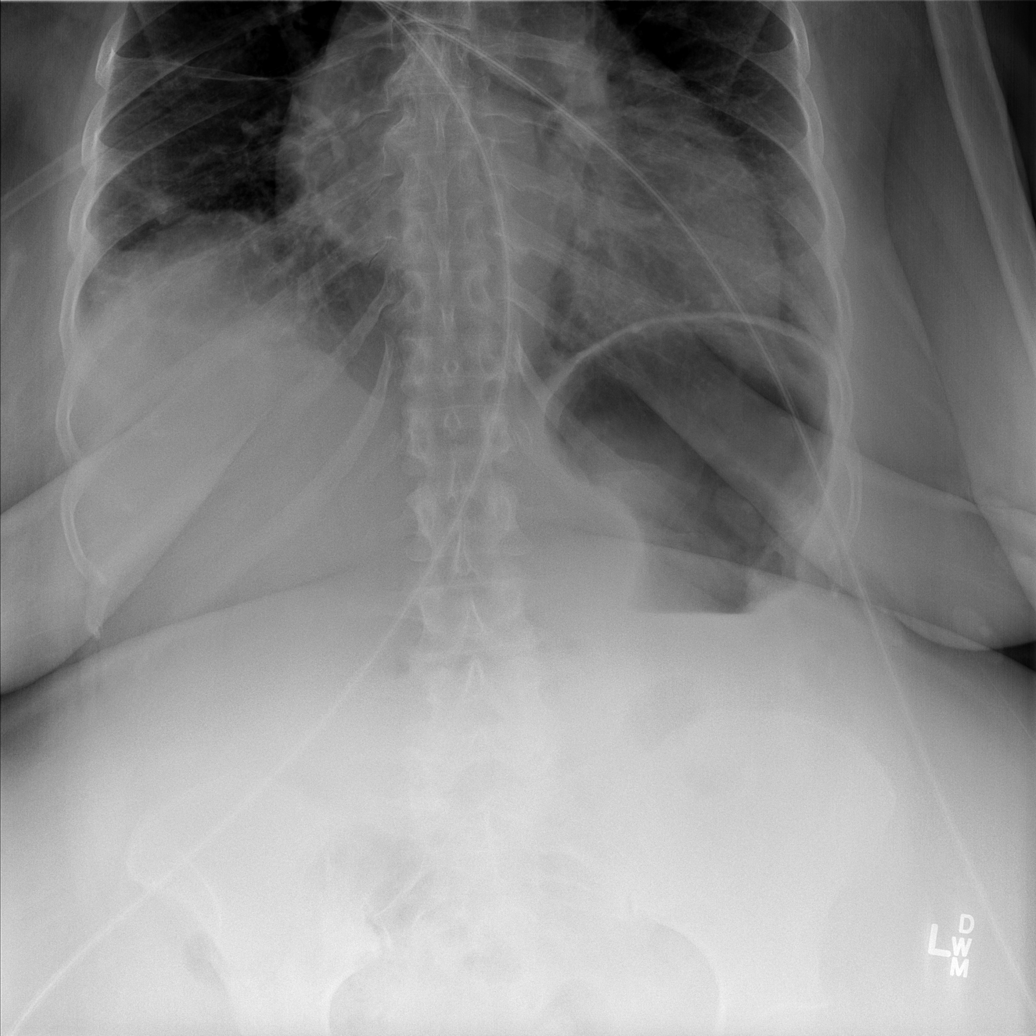

[t abdomen supine (1 of 2)]
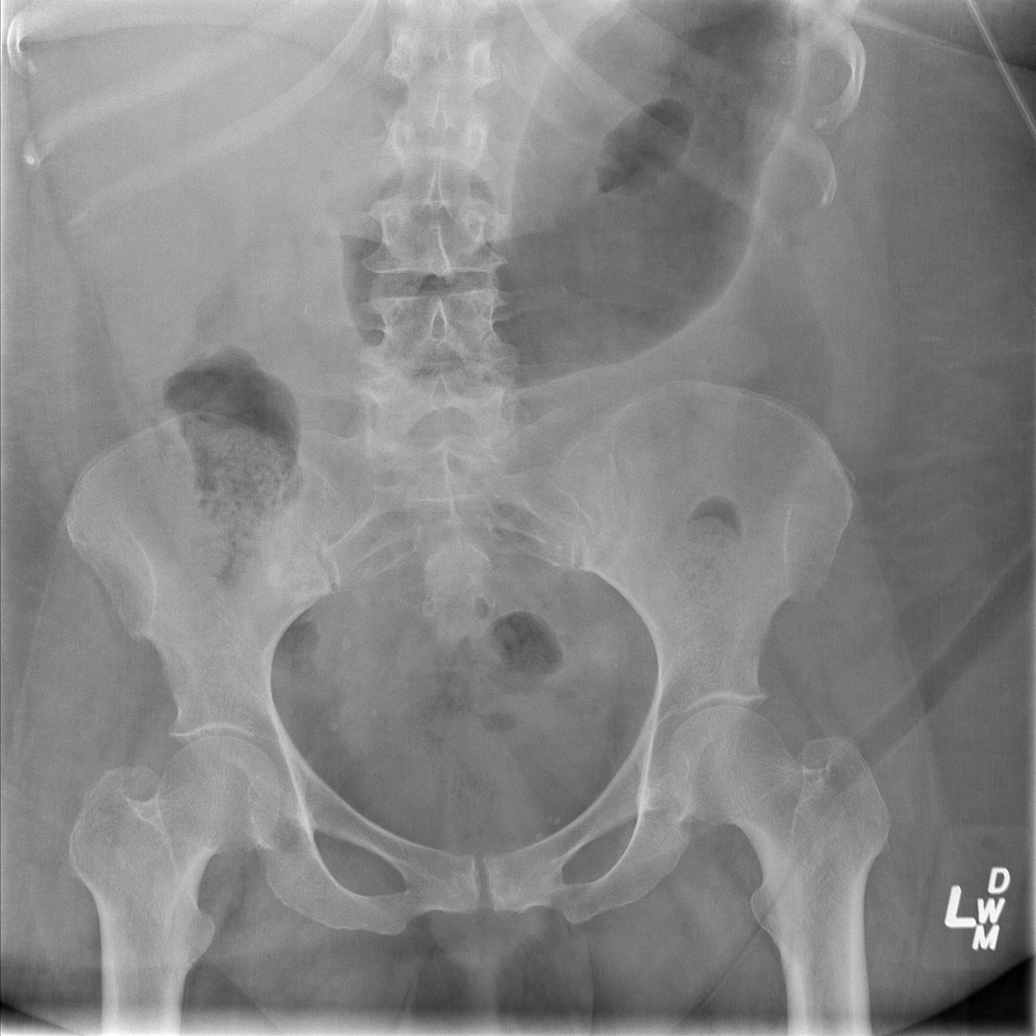

[t abdomen supine (2 of 2)]
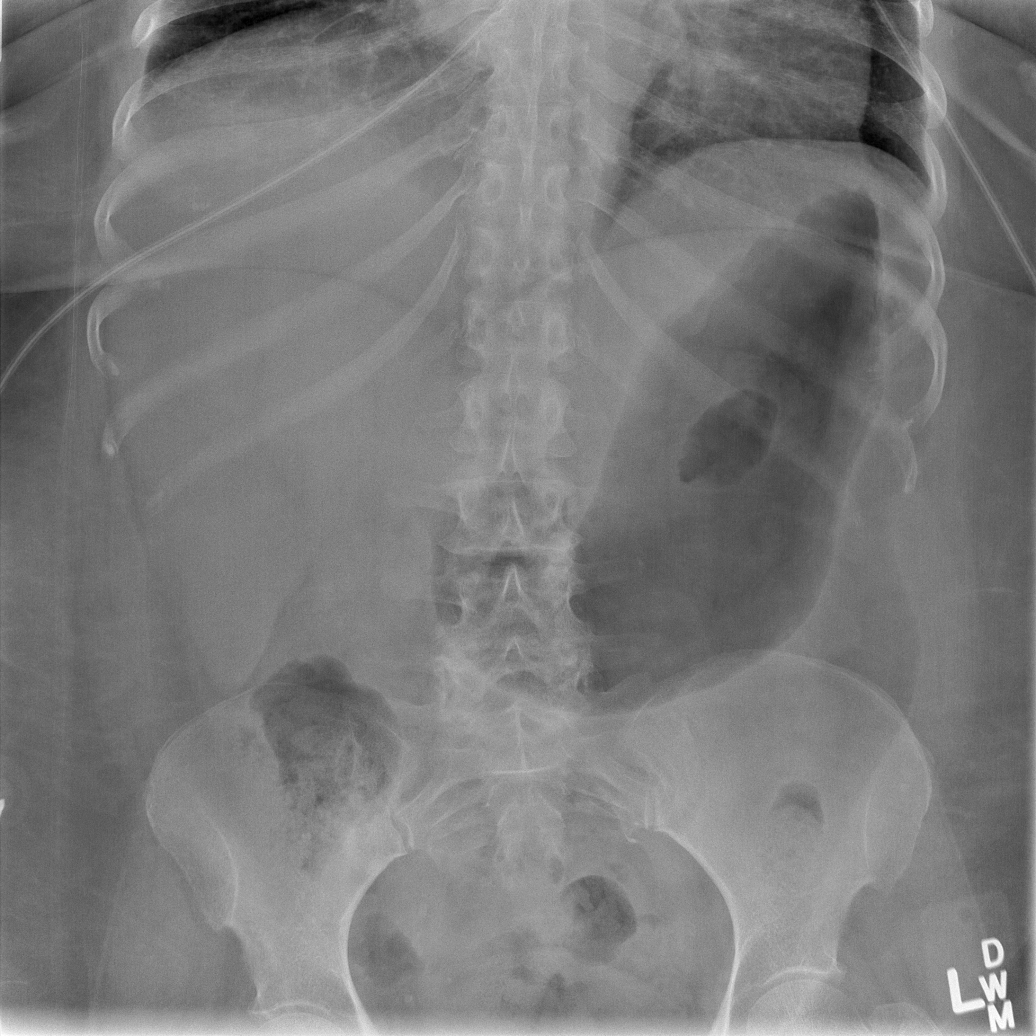

[4 of 4 positions shown; findings below may reference images not displayed]

FINDINGS: Interstitial markings are mildly prominent but similar to the
previous examination. Heart size is slightly prominent but stable.
The trachea is midline. There is no evidence to suggest free air.
Gas in the stomach. Small amount of gas and stool in the colon.
Nonobstructive bowel gas pattern. Mild degenerative changes at the
pubic symphysis.
IMPRESSION: Slightly prominent lung markings which are similar to the previous
examinations. Difficult to exclude mild interstitial edema or
vascular congestion.

Gaseous distention of the stomach.

## 2016-11-27 IMAGING — CT CT CTA ABD/PEL W/CM AND/OR W/O CM
3 of 9 series · 11 of 46 positions shown, 17 images · IV contrast (APPLIED)
Comparison: Abdominal radiographs 09/03/2014

CLINICAL DATA: 58-year-old with left-sided abdominal pain for 2
weeks. New onset of atrial fibrillation.

EXAM:
CT ANGIOGRAPHY ABDOMEN AND PELVIS
TECHNIQUE: Multidetector CT imaging of the abdomen and pelvis was performed
using the standard protocol during bolus administration of
intravenous contrast. Multiplanar reconstructed images including
MIPs were obtained and reviewed to evaluate the vascular anatomy.
CONTRAST:  100 mL Omnipaque 350

[Series 6: arterial 2.0 b26f · axial · arterial · 0.91mm/px · z∈[-318,-276]mm · 2 of 210 slices shown]
[im 21/210  soft-tissue]
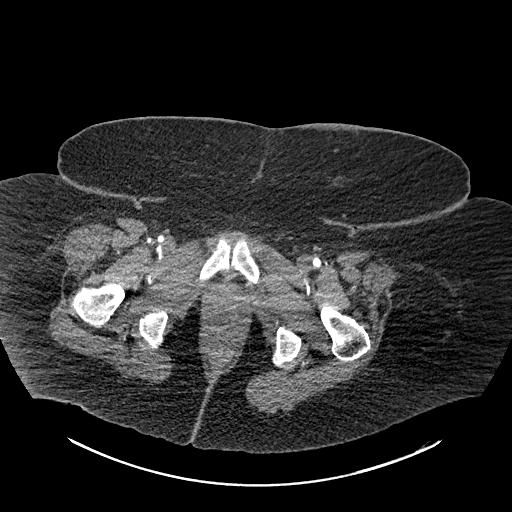
[im 42/210  soft-tissue]
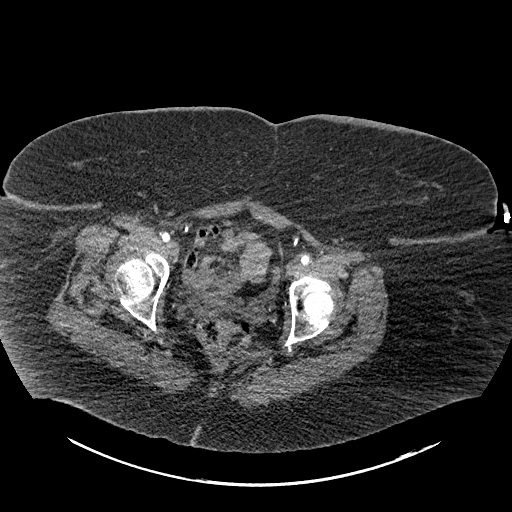

[Series 8: arterial 2.0 coronal · coronal · arterial · 0.87mm/px · 2 of 167 slices shown, 3 images]
[im 56/167  soft-tissue]
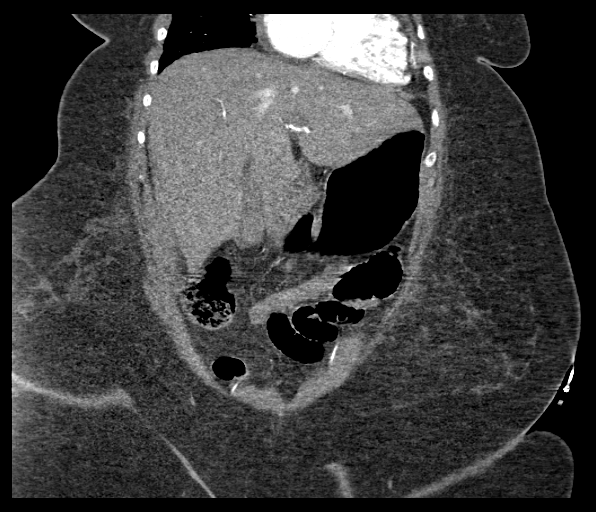
[im 56/167  bone]
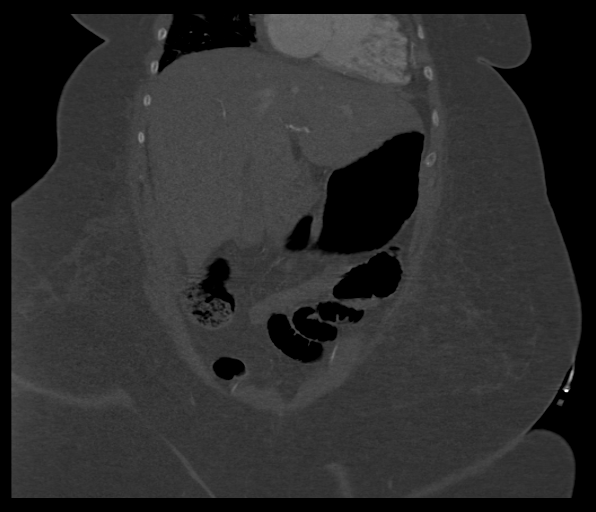
[im 111/167  soft-tissue]
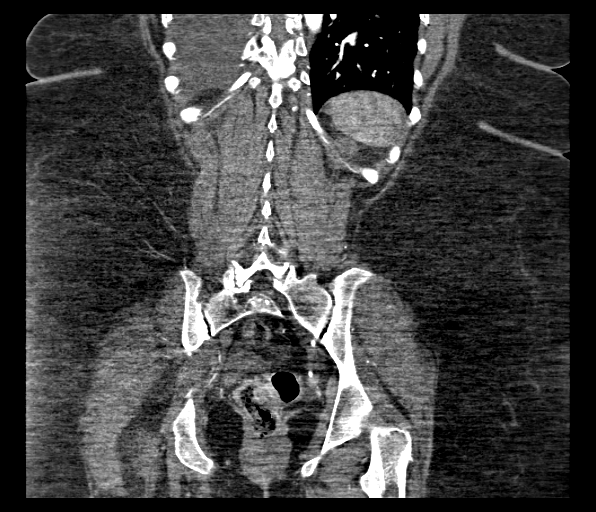

[Series 12: venous 5.0 b31f · axial · portal-venous · 0.97mm/px · z∈[-306,+4]mm · 7 of 84 slices shown, 12 images]
[im 11/84  soft-tissue]
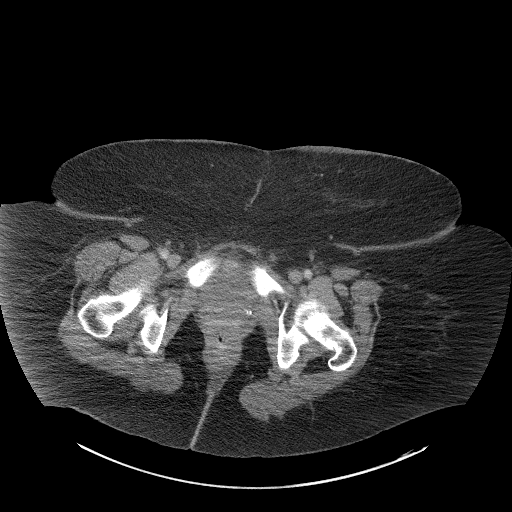
[im 11/84  bone]
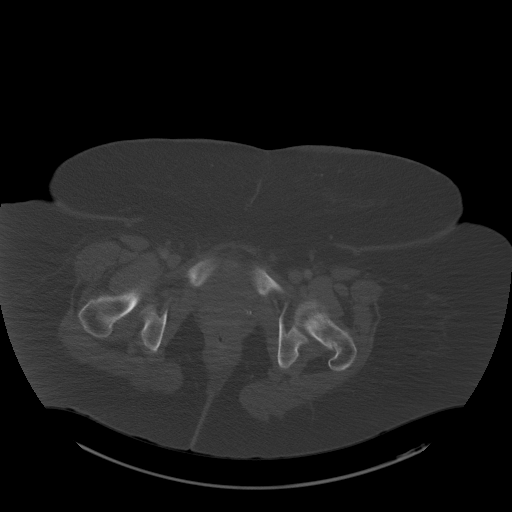
[im 21/84  soft-tissue]
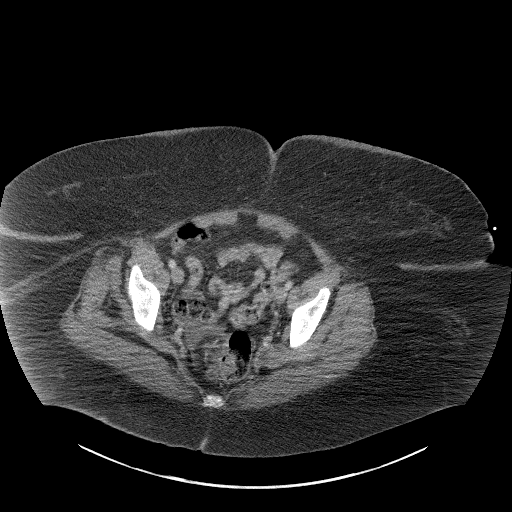
[im 32/84  soft-tissue]
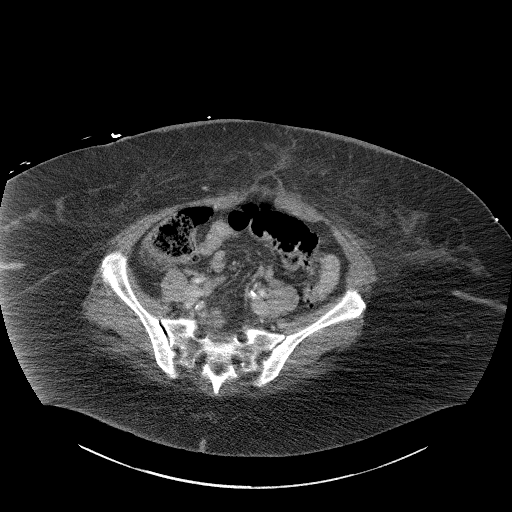
[im 42/84  soft-tissue]
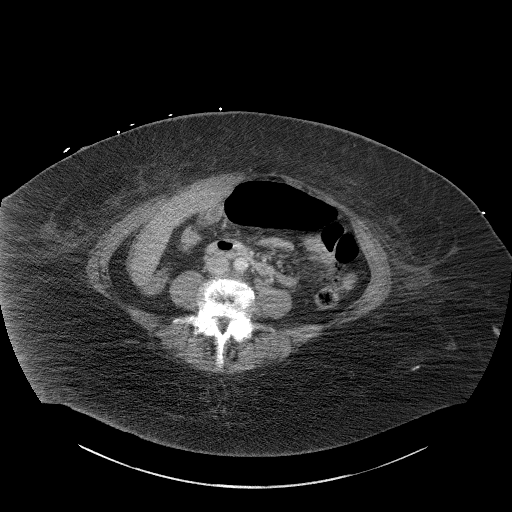
[im 42/84  lung]
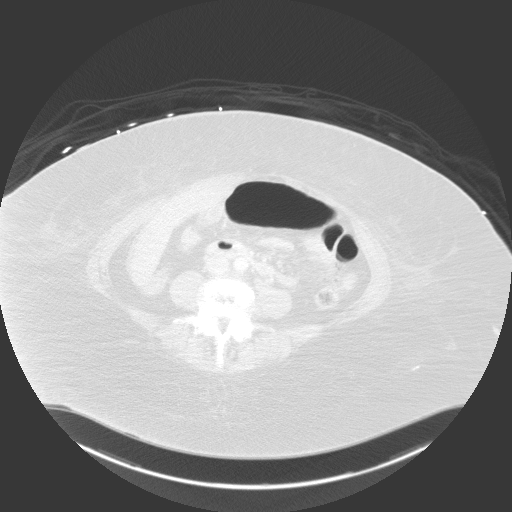
[im 52/84  soft-tissue]
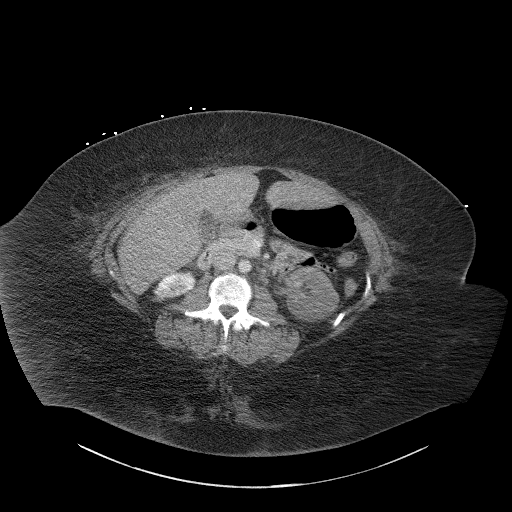
[im 52/84  lung]
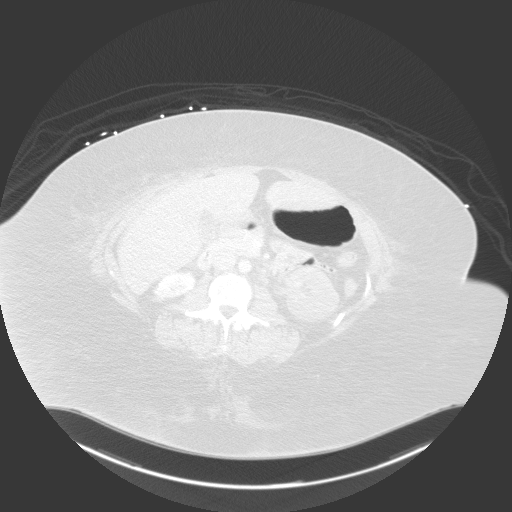
[im 63/84  soft-tissue]
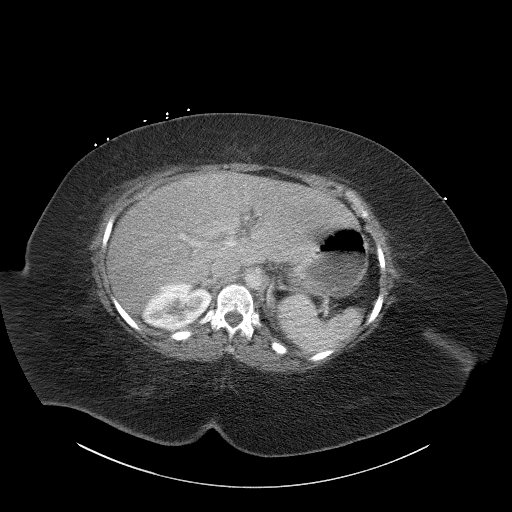
[im 63/84  lung]
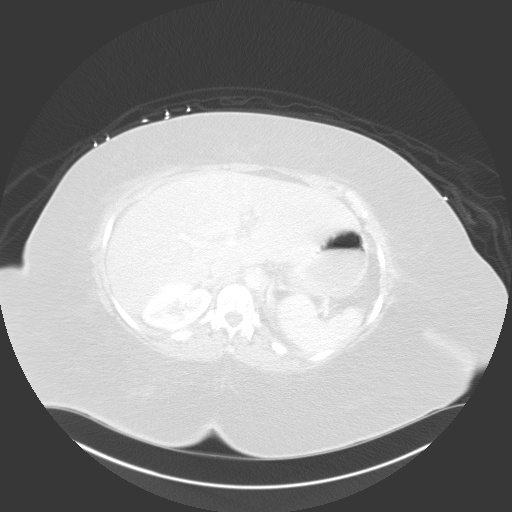
[im 73/84  soft-tissue]
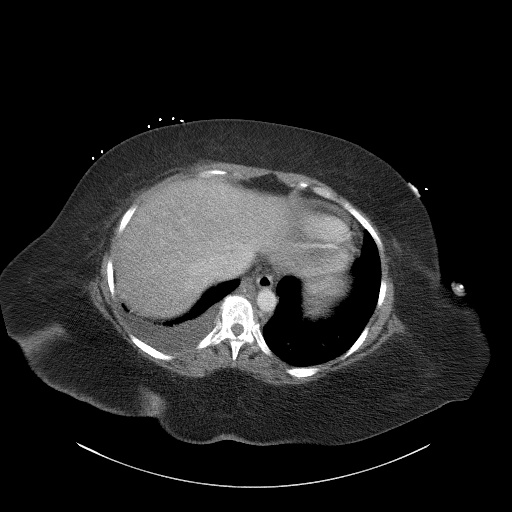
[im 73/84  lung]
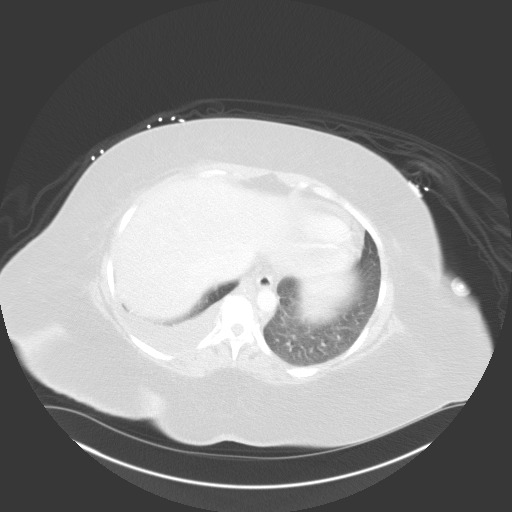

[11 of 46 positions shown; findings below may reference images not displayed]

FINDINGS: ARTERIAL FINDINGS:

Aorta/Heart: Large filling defects in the posterior left atrium are
most compatible with thrombus. The entire left atrium is not imaged.
Right atrium appears to be enlarged. Cannot exclude thrombus in the
right pulmonary veins. The abdominal aorta is patent without
aneurysm or dissection.

Celiac axis: Celiac trunk and main branch vessels are patent,
including the splenic artery.

Superior mesenteric: The superior mesenteric artery is patent. Main
branch vessels are patent.

Left renal: Occlusion of the left renal artery just beyond the
origin. Minimal blood flow to the left kidney even on the delayed
images.

Right renal:         Right renal artery is patent.

Inferior mesenteric: Patent.

Left iliac: Left iliac arteries are patent. The proximal left
femoral arteries are patent.

Right iliac: Right iliac arteries are patent. The proximal right
femoral arteries are patent.

Venous findings: IVC and renal veins are patent. Portal venous
system is patent.

Review of the MIP images confirms the above findings.

NONVASCULAR FINDINGS:

There is a small-moderate sized right pleural effusion. Few patchy
densities in the right middle lobe may represent atelectasis. No
gross abnormality to the liver. There is a small amount of
perihepatic ascites and fluid around the gallbladder. Suspect a
small amount of periportal edema. No gross abnormality to the spleen
or adrenal glands. No gross abnormality to the pancreas. Suspect
scarring along the right kidney lower pole. The left kidney is
edematous with minimal arterial flow. Findings compatible with acute
vascular occlusion of the left renal artery with left kidney
ischemia.

Some of the images are limited by motion artifact. No gross
abnormality to the stomach or duodenum. Small amount of free fluid
in the pelvis. Uterus is absent. No gross abnormality to the urinary
bladder. No gross abnormality to the small or large bowel.

No acute bone abnormality.
IMPRESSION: Large filling defect in the left atrium is suggestive for thrombus.
An underlying lesion cannot be excluded. This may be better
characterized with an echocardiogram.

Occlusion of the proximal left renal artery with no significant flow
to the left kidney. Findings are compatible with acute arterial
occlusion with left kidney infarction. Findings compatible with
thromboembolic disease originating from the left atrium.

Small amount of fluid in the right abdomen and pelvis.

Critical Value/emergent results were called by telephone at the time
of interpretation on 09/03/2014 at [DATE] to Dr. DANII AUJLA ,
who verbally acknowledged these results.
